# Patient Record
Sex: Female | Born: 1955
Health system: Southern US, Community
[De-identification: ages and names within clinical notes are randomized; demographics above are authoritative.]

## PROBLEM LIST (undated history)

## (undated) DIAGNOSIS — G35 Multiple sclerosis: Secondary | ICD-10-CM

## (undated) DIAGNOSIS — M797 Fibromyalgia: Secondary | ICD-10-CM

## (undated) DIAGNOSIS — E7800 Pure hypercholesterolemia, unspecified: Secondary | ICD-10-CM

## (undated) DIAGNOSIS — K5792 Diverticulitis of intestine, part unspecified, without perforation or abscess without bleeding: Secondary | ICD-10-CM

## (undated) DIAGNOSIS — E538 Deficiency of other specified B group vitamins: Secondary | ICD-10-CM

## (undated) DIAGNOSIS — K219 Gastro-esophageal reflux disease without esophagitis: Secondary | ICD-10-CM

## (undated) DIAGNOSIS — G43909 Migraine, unspecified, not intractable, without status migrainosus: Secondary | ICD-10-CM

## (undated) DIAGNOSIS — K279 Peptic ulcer, site unspecified, unspecified as acute or chronic, without hemorrhage or perforation: Secondary | ICD-10-CM

## (undated) HISTORY — DX: Fibromyalgia: M79.7

## (undated) HISTORY — DX: Pure hypercholesterolemia, unspecified: E78.00

## (undated) HISTORY — DX: Migraine, unspecified, not intractable, without status migrainosus: G43.909

## (undated) HISTORY — DX: Deficiency of other specified B group vitamins: E53.8

## (undated) HISTORY — DX: Peptic ulcer, site unspecified, unspecified as acute or chronic, without hemorrhage or perforation: K27.9

## (undated) HISTORY — DX: Gastro-esophageal reflux disease without esophagitis: K21.9

## (undated) HISTORY — DX: Diverticulitis of intestine, part unspecified, without perforation or abscess without bleeding: K57.92

## (undated) HISTORY — PX: BACK SURGERY: SHX140

## (undated) HISTORY — PX: OTHER SURGICAL HISTORY: SHX169

## (undated) HISTORY — PX: BREAST BIOPSY: SHX20

---

## 2004-01-21 ENCOUNTER — Other Ambulatory Visit: Payer: Self-pay

## 2005-05-02 ENCOUNTER — Ambulatory Visit: Payer: Self-pay | Admitting: Ophthalmology

## 2005-11-16 ENCOUNTER — Ambulatory Visit: Payer: Self-pay | Admitting: Neurology

## 2006-06-17 ENCOUNTER — Ambulatory Visit: Payer: Self-pay | Admitting: Pain Medicine

## 2006-07-08 ENCOUNTER — Ambulatory Visit: Payer: Self-pay | Admitting: Pain Medicine

## 2006-07-09 ENCOUNTER — Ambulatory Visit: Payer: Self-pay | Admitting: Pain Medicine

## 2006-09-01 ENCOUNTER — Emergency Department: Payer: Self-pay | Admitting: Emergency Medicine

## 2006-09-04 ENCOUNTER — Ambulatory Visit: Payer: Self-pay | Admitting: Pain Medicine

## 2006-10-08 ENCOUNTER — Ambulatory Visit: Payer: Self-pay | Admitting: Pain Medicine

## 2007-01-16 ENCOUNTER — Ambulatory Visit: Payer: Self-pay | Admitting: Pain Medicine

## 2007-02-02 ENCOUNTER — Emergency Department: Payer: Self-pay | Admitting: Emergency Medicine

## 2007-02-05 ENCOUNTER — Ambulatory Visit: Payer: Self-pay | Admitting: Pain Medicine

## 2007-03-20 ENCOUNTER — Ambulatory Visit: Payer: Self-pay | Admitting: Pain Medicine

## 2007-09-24 HISTORY — PX: COLONOSCOPY: SHX174

## 2007-10-08 ENCOUNTER — Ambulatory Visit: Payer: Self-pay | Admitting: Gastroenterology

## 2008-03-06 ENCOUNTER — Emergency Department: Payer: Self-pay | Admitting: Emergency Medicine

## 2008-03-09 ENCOUNTER — Ambulatory Visit: Payer: Self-pay | Admitting: Pain Medicine

## 2008-03-15 ENCOUNTER — Ambulatory Visit: Payer: Self-pay | Admitting: Pain Medicine

## 2008-04-07 ENCOUNTER — Ambulatory Visit: Payer: Self-pay | Admitting: Pain Medicine

## 2008-12-08 ENCOUNTER — Ambulatory Visit: Payer: Self-pay | Admitting: Internal Medicine

## 2008-12-29 ENCOUNTER — Ambulatory Visit: Payer: Self-pay | Admitting: Pain Medicine

## 2009-01-31 ENCOUNTER — Ambulatory Visit: Payer: Self-pay | Admitting: Pain Medicine

## 2009-03-03 ENCOUNTER — Ambulatory Visit: Payer: Self-pay | Admitting: Unknown Physician Specialty

## 2009-04-18 ENCOUNTER — Ambulatory Visit: Payer: Self-pay | Admitting: Pain Medicine

## 2009-05-02 ENCOUNTER — Ambulatory Visit: Payer: Self-pay | Admitting: Pain Medicine

## 2009-08-12 ENCOUNTER — Emergency Department: Payer: Self-pay | Admitting: Emergency Medicine

## 2009-12-28 ENCOUNTER — Ambulatory Visit: Payer: Self-pay | Admitting: Internal Medicine

## 2010-03-06 ENCOUNTER — Ambulatory Visit: Payer: Self-pay | Admitting: Pain Medicine

## 2010-03-28 ENCOUNTER — Ambulatory Visit: Payer: Self-pay | Admitting: Pain Medicine

## 2011-01-09 ENCOUNTER — Ambulatory Visit: Payer: Self-pay | Admitting: Internal Medicine

## 2011-07-25 ENCOUNTER — Ambulatory Visit: Payer: Self-pay | Admitting: Internal Medicine

## 2012-01-24 ENCOUNTER — Ambulatory Visit: Payer: Self-pay | Admitting: Internal Medicine

## 2012-10-22 ENCOUNTER — Ambulatory Visit: Payer: Self-pay | Admitting: Pain Medicine

## 2012-11-05 ENCOUNTER — Ambulatory Visit: Payer: Self-pay | Admitting: Pain Medicine

## 2012-12-11 ENCOUNTER — Ambulatory Visit: Payer: Self-pay | Admitting: Pain Medicine

## 2013-01-26 ENCOUNTER — Ambulatory Visit: Payer: Self-pay | Admitting: Internal Medicine

## 2014-01-27 ENCOUNTER — Ambulatory Visit: Payer: Self-pay | Admitting: Internal Medicine

## 2014-03-03 ENCOUNTER — Ambulatory Visit: Payer: Self-pay | Admitting: Pain Medicine

## 2014-03-16 ENCOUNTER — Ambulatory Visit: Payer: Self-pay | Admitting: Pain Medicine

## 2014-07-12 ENCOUNTER — Emergency Department: Payer: Self-pay | Admitting: Emergency Medicine

## 2014-07-12 LAB — CBC WITH DIFFERENTIAL/PLATELET
Basophil #: 0 10*3/uL (ref 0.0–0.1)
Basophil %: 0.4 %
Eosinophil #: 0 10*3/uL (ref 0.0–0.7)
Eosinophil %: 0.2 %
HCT: 38.7 % (ref 35.0–47.0)
HGB: 12.7 g/dL (ref 12.0–16.0)
Lymphocyte #: 0.6 10*3/uL — ABNORMAL LOW (ref 1.0–3.6)
Lymphocyte %: 7.7 %
MCH: 27.3 pg (ref 26.0–34.0)
MCHC: 32.9 g/dL (ref 32.0–36.0)
MCV: 83 fL (ref 80–100)
Monocyte #: 0.2 x10 3/mm (ref 0.2–0.9)
Monocyte %: 2.6 %
Neutrophil #: 6.5 10*3/uL (ref 1.4–6.5)
Neutrophil %: 89.1 %
Platelet: 182 10*3/uL (ref 150–440)
RBC: 4.65 10*6/uL (ref 3.80–5.20)
RDW: 13.2 % (ref 11.5–14.5)
WBC: 7.3 10*3/uL (ref 3.6–11.0)

## 2014-07-12 LAB — COMPREHENSIVE METABOLIC PANEL
Albumin: 4 g/dL (ref 3.4–5.0)
Alkaline Phosphatase: 67 U/L
Anion Gap: 10 (ref 7–16)
BUN: 9 mg/dL (ref 7–18)
Bilirubin,Total: 0.8 mg/dL (ref 0.2–1.0)
Calcium, Total: 9.1 mg/dL (ref 8.5–10.1)
Chloride: 103 mmol/L (ref 98–107)
Co2: 25 mmol/L (ref 21–32)
Creatinine: 0.94 mg/dL (ref 0.60–1.30)
EGFR (African American): >60
EGFR (Non-African Amer.): >60
Glucose: 120 mg/dL — ABNORMAL HIGH (ref 65–99)
Osmolality: 276 (ref 275–301)
Potassium: 4.1 mmol/L (ref 3.5–5.1)
SGOT(AST): 22 U/L (ref 15–37)
SGPT (ALT): 23 U/L (ref 12–78)
Sodium: 138 mmol/L (ref 136–145)
Total Protein: 7.8 g/dL (ref 6.4–8.2)

## 2014-07-12 LAB — URINALYSIS, COMPLETE
Bacteria: NONE SEEN
Bilirubin,UR: NEGATIVE
Glucose,UR: NEGATIVE mg/dL (ref 0–75)
Ketone: NEGATIVE
Leukocyte Esterase: NEGATIVE
Nitrite: NEGATIVE
Ph: 7 (ref 4.5–8.0)
Protein: NEGATIVE
RBC,UR: 3 /HPF (ref 0–5)
Specific Gravity: 1.011 (ref 1.003–1.030)
Squamous Epithelial: <1
WBC UR: 1 /HPF (ref 0–5)

## 2014-07-12 LAB — LIPASE, BLOOD: Lipase: 80 U/L (ref 73–393)

## 2015-02-15 ENCOUNTER — Ambulatory Visit: Payer: Self-pay | Admitting: Internal Medicine

## 2015-04-20 ENCOUNTER — Encounter: Payer: Self-pay | Admitting: Neurology

## 2015-04-20 ENCOUNTER — Ambulatory Visit (INDEPENDENT_AMBULATORY_CARE_PROVIDER_SITE_OTHER): Payer: Medicare Other | Admitting: Neurology

## 2015-04-20 VITALS — BP 124/73 | HR 68 | Temp 98.3°F | Ht 68.0 in | Wt 192.6 lb

## 2015-04-20 DIAGNOSIS — R29898 Other symptoms and signs involving the musculoskeletal system: Secondary | ICD-10-CM | POA: Diagnosis not present

## 2015-04-20 DIAGNOSIS — R202 Paresthesia of skin: Secondary | ICD-10-CM

## 2015-04-20 DIAGNOSIS — M542 Cervicalgia: Secondary | ICD-10-CM

## 2015-04-20 DIAGNOSIS — M5010 Cervical disc disorder with radiculopathy, unspecified cervical region: Secondary | ICD-10-CM | POA: Diagnosis not present

## 2015-04-20 DIAGNOSIS — R200 Anesthesia of skin: Secondary | ICD-10-CM

## 2015-04-20 NOTE — Progress Notes (Signed)
[facility]    Provider:  Dr Jaynee Eagles Referring Provider: Marden Noble, MD Primary Care Physician:  Marden Noble, MD  CC:  Neck pain, arm weakness, arm numbness  HPI:  Ashlee Richardson is a 59 y.o. female here as a referral from Dr. Brynda Greathouse for neck pain. PMHx chronic pain, migraine, fibromyalgia, chronic neck and low back pain. Acute onset neck and right arm pain with right arm numbness. She went to the dentist and had a tooth filled. She went April 16th. She had to numb it a few times, it hit a nerve. After that the whole right side of the neck and head hurts. Can't describe, just says "pain". Denies tingling, burning, shooting. Just painful.  She went to family doctor, prednisone and antibiotics didn't help. Can't raise her right arm. Whole arm gets numb on the right. Weakness and pain moving the right arm. It is all day long, painful continuously. Tremor in the head only started after this incident, she has a tremor of the head and can't control it. She had "spots on her brain" when she saw a neurologist in the past. Saw a neurologist for migraines in the past. Has a chronic history of neck pain but mri's of cervical spine are unremarkable. She has chronic pain in the neck and went to pain management. No bowel or bladder changes.  Reviewed notes, labs and imaging from outside physicians, which showed: She has undergone myoneural blocks, she has had TENS unit applied, she has been seen by Dr. Deanne Coffer the pain management clinic. She continues to be bothered by a lot of pain and sciatic nerve problems. She has chronic pain syndrome, chronic low back pain, status post laminectomy syndrome. Radiculopathy and chronic pain.MRi of the cervical spine 02/2014 shows 5 cervical spine 03/12/2014 showed a stable endplate degenerative changes at C4-C5 C5-C6 and C6-C7. Minimal enlargement of broad-based central disc protrusion at C3-C4 with no resulting cord deformity or foraminal compromise.  Spondylosis from C4-C5 through C6-C7, mildly progressive at C4-C5 and C5-C6. There is mild to moderate associated foraminal narrowing as detailed above worse on the left at C5-C6. No cord deformity. MRI of the lumbar spine in March 2015 showed overall stable mild bilateral lateral recess stenosis and mild bilateral foraminal stenosis at L2-L3,. CMP unremarkable last 06/2014.  Review of Systems: Patient complains of symptoms per HPI as well as the following symptoms: Snoring, memory loss, numbness, tremor, sleepiness, snoring, restless legs, constipation, aching muscles, not enough sleep. Pertinent negatives per HPI. All others negative.   History   Social History  . Marital Status: Single    Spouse Name: N/A  . Number of Children: 2  . Years of Education: College   Occupational History  . Not on file.   Social History Main Topics  . Smoking status: Never Smoker   . Smokeless tobacco: Not on file  . Alcohol Use: No  . Drug Use: No  . Sexual Activity: Not on file   Other Topics Concern  . Not on file   Social History Narrative   Lives at home by herself.   Caffeine use: 2 cups coffee per day    Family History  Problem Relation Age of Onset  . Heart disease    . Cancer      Breast  . Diabetes      Past Medical History  Diagnosis Date  . Migraine   . High cholesterol   . Fibromyalgia     Past Surgical  History  Procedure Laterality Date  . Back surgery  1991?    Pt usure of date  . Foot sugery Right     3rd toe from the right"    Current Outpatient Prescriptions  Medication Sig Dispense Refill  . CRESTOR 10 MG tablet Take 10 mg by mouth daily.  6  . HYDROcodone-acetaminophen (NORCO/VICODIN) 5-325 MG per tablet Take 1 tablet by mouth every 4 (four) hours as needed.    Marland Kitchen oxyCODONE-acetaminophen (PERCOCET) 7.5-325 MG per tablet Take 1 tablet by mouth as needed.     No current facility-administered medications for this visit.    Allergies as of 04/20/2015 - never  reviewed  Allergen Reaction Noted  . Fenofibric acid  04/20/2015  . Lipitor [atorvastatin]  04/20/2015  . Nsaids  04/20/2015    Vitals: BP 124/73 mmHg  Pulse 68  Temp(Src) 98.3 F (36.8 C)  Ht 5\' 8"  (1.727 m)  Wt 192 lb 9.6 oz (87.363 kg)  BMI 29.29 kg/m2 Last Weight:  Wt Readings from Last 1 Encounters:  04/20/15 192 lb 9.6 oz (87.363 kg)   Last Height:   Ht Readings from Last 1 Encounters:  04/20/15 5\' 8"  (1.610 m)    Physical exam: Exam: Gen: NAD, conversant, well nourised, overweight, well groomed                     CV: RRR, no MRG. No Carotid Bruits. No peripheral edema, warm, nontender Eyes: Conjunctivae clear without exudates or hemorrhage  Neuro: Detailed Neurologic Exam  Speech:    Speech is normal; fluent and spontaneous with normal comprehension.  Cognition:    The patient is oriented to person, place, and time;     recent and remote memory intact;     language fluent;     normal attention, concentration,     fund of knowledge Cranial Nerves:    The pupils are equal, round, and reactive to light. The fundi are normal and spontaneous venous pulsations are present. Visual fields are full to finger confrontation. Extraocular movements are intact. Trigeminal sensation is intact and the muscles of mastication are normal. The face is symmetric. The palate elevates in the midline. Hearing intact. Voice is normal. Shoulder shrug is normal. The tongue has normal motion without fasciculations.   Coordination:    Unable to perform due to pain  Gait:    No ataxia  Motor Observation and posture:    Patient has her head rotated ted in laterocollis with elevated right trapezius. She has a "no-no" tremor. However the tone her neck and shoulders does not appear increased. The tremor and laterocollis are distractable.  Tone:    Normal muscle tone.       Strength:    Right arm weakness secondary to pain, right leg prox weakness due to back pain     Sensation:  intact to LT     Reflex Exam:  DTR's:    Attempted right arm, deferred due to patient pain. Deep tendon reflexes in the upper and lower extremities are intact bilaterally.  Toes:    The toes are downgoing bilaterally.   Clonus:    Clonus is absent.      Assessment/Plan:   Ashlee Richardson is a 59 y.o. female here as a referral from Dr. Brynda Greathouse for neck pain. PMHx chronic pain, migraine, fibromyalgia, chronic neck and low back pain. Acute onset neck and right arm pain with right arm numbness. Patient has her head rotated  in laterocollis  with elevated right trapezius. She has a "no-no" tremor. However the tone her neck and shoulders does not appear increased. The tremor and laterocollis are distractable. Appears to be musculoskeletal in nature but given the acute onset with right-sided numbness and weakness and neck pain, should check an MRI of the neck again.   MRi of the neck Heat, massage, stretching Offered muscle relaxers but she said she has tried them all in the past.   Sarina Ill, MD  [facility] [address] [place], [place] [address]  Phone [phone] Fax [phone]

## 2015-04-20 NOTE — Patient Instructions (Signed)
Overall you are doing fairly well but I do want to suggest a few things today:   Remember to drink plenty of fluid, eat healthy meals and do not skip any meals. Try to eat protein with a every meal and eat a healthy snack such as fruit or nuts in between meals. Try to keep a regular sleep-wake schedule and try to exercise daily, particularly in the form of walking, 20-30 minutes a day, if you can.   As far as your medications are concerned, I would like to suggest  As far as diagnostic testing: MRI  cervical spine  I would like to see you back after workup, sooner if we need to. Please call us with any interim questions, concerns, problems, updates or refill requests.   Please also call us for any test results so we can go over those with you on the phone.  My clinical assistant and will answer any of your questions and relay your messages to me and also relay most of my messages to you.   Our phone number is [phone]. We also have an after hours call service for urgent matters and there is a physician on-call for urgent questions. For any emergencies you know to call 911 or go to the nearest emergency room

## 2015-05-08 ENCOUNTER — Ambulatory Visit
Admission: RE | Admit: 2015-05-08 | Discharge: 2015-05-08 | Disposition: A | Discharge status: Home / Self Care | Payer: Self-pay | Source: Ambulatory Visit | Attending: Neurology | Admitting: Neurology

## 2015-05-08 DIAGNOSIS — M5010 Cervical disc disorder with radiculopathy, unspecified cervical region: Secondary | ICD-10-CM

## 2015-05-08 DIAGNOSIS — R29898 Other symptoms and signs involving the musculoskeletal system: Secondary | ICD-10-CM

## 2015-05-08 DIAGNOSIS — M542 Cervicalgia: Secondary | ICD-10-CM | POA: Diagnosis not present

## 2015-05-08 DIAGNOSIS — R200 Anesthesia of skin: Secondary | ICD-10-CM

## 2015-05-10 ENCOUNTER — Ambulatory Visit: Payer: Self-pay | Admitting: Pain Medicine

## 2015-05-10 ENCOUNTER — Telehealth: Payer: Self-pay

## 2015-05-10 NOTE — Telephone Encounter (Signed)
Spoke to patient, recommended going back to primary care for her arm pain.

## 2015-05-10 NOTE — Telephone Encounter (Signed)
Patient was informed of MRI of cervical spine being stable and  unchanged since her last MRI of cervical spine, pt asked if she could still speak with Dr. Jaynee Eagles.  She wants to discuss what could be the problem since this scan was normal.  Thanks

## 2015-05-23 ENCOUNTER — Other Ambulatory Visit: Payer: Self-pay | Admitting: Pain Medicine

## 2015-05-23 DIAGNOSIS — G35 Multiple sclerosis: Secondary | ICD-10-CM | POA: Insufficient documentation

## 2015-05-23 DIAGNOSIS — G43109 Migraine with aura, not intractable, without status migrainosus: Secondary | ICD-10-CM

## 2015-05-23 DIAGNOSIS — M5481 Occipital neuralgia: Secondary | ICD-10-CM | POA: Insufficient documentation

## 2015-05-23 DIAGNOSIS — M5136 Other intervertebral disc degeneration, lumbar region: Secondary | ICD-10-CM

## 2015-05-23 DIAGNOSIS — M5030 Other cervical disc degeneration, unspecified cervical region: Secondary | ICD-10-CM

## 2015-05-24 ENCOUNTER — Ambulatory Visit: Payer: Self-pay | Admitting: Pain Medicine

## 2015-10-17 DIAGNOSIS — G43019 Migraine without aura, intractable, without status migrainosus: Secondary | ICD-10-CM | POA: Insufficient documentation

## 2016-05-02 ENCOUNTER — Encounter: Payer: Self-pay | Admitting: *Deleted

## 2016-05-07 ENCOUNTER — Ambulatory Visit (INDEPENDENT_AMBULATORY_CARE_PROVIDER_SITE_OTHER): Payer: Medicare Other | Admitting: General Surgery

## 2016-05-07 ENCOUNTER — Encounter: Payer: Self-pay | Admitting: General Surgery

## 2016-05-07 VITALS — BP 130/74 | HR 82 | Temp 97.1°F | Resp 12 | Ht 68.0 in | Wt 184.0 lb

## 2016-05-07 DIAGNOSIS — R109 Unspecified abdominal pain: Secondary | ICD-10-CM

## 2016-05-07 DIAGNOSIS — R1012 Left upper quadrant pain: Secondary | ICD-10-CM

## 2016-05-07 NOTE — Progress Notes (Signed)
Patient ID: Ashlee Richardson, female   DOB: [birth year], 59 y.o.   MRN: [id number]  Chief Complaint  Patient presents with  . Abdominal Pain    HPI Ashlee Richardson is a 60 y.o. female here today for a evaluation of abdominal pain. She says that the pain started last month and then went away. The pain returned and she has been on two rounds of antibiotics. While on the antibiotics the pain seems to go away and then returns soon afterwards. She says the pain is in the left lower flank and moves around to the front. She has a history of diverticulosis but no documented diverticulitis She has had constipation and then diarrhea. Urinating with out difficulty.  I have reviewed the history of present illness with the patient.  HPI  Past Medical History  Diagnosis Date  . Migraine   . High cholesterol   . Fibromyalgia   . GERD (gastroesophageal reflux disease)   . Diverticulitis     Past Surgical History  Procedure Laterality Date  . Back surgery  1991?    Pt usure of date  . Foot sugery Right     3rd toe from the right"    Family History  Problem Relation Age of Onset  . Heart disease    . Cancer      Breast  . Diabetes      Social History Social History  Substance Use Topics  . Smoking status: Never Smoker   . Smokeless tobacco: Never Used  . Alcohol Use: No    Allergies  Allergen Reactions  . Fenofibric Acid   . Lipitor [Atorvastatin]   . Nsaids     Current Outpatient Prescriptions  Medication Sig Dispense Refill  . CRESTOR 10 MG tablet Take 10 mg by mouth daily.  6  . esomeprazole (NEXIUM) 40 MG capsule TAKE ONE CAPSULE BY MOUTH ONCE A DAY FOR GERD  4  . gabapentin (NEURONTIN) 100 MG capsule Take 100 mg by mouth 3 (three) times daily.     No current facility-administered medications for this visit.    Review of Systems Review of Systems  Constitutional: Negative.   Respiratory: Negative.   Cardiovascular: Negative.   Gastrointestinal: Positive for  abdominal pain, diarrhea, constipation and abdominal distention. Negative for nausea, vomiting, blood in stool, anal bleeding and rectal pain.    Blood pressure 130/74, pulse 82, temperature 97.1 F (36.2 C), resp. rate 12, height 5' 8" (1.727 m), weight 184 lb (83.462 kg).  Physical Exam Physical Exam  Constitutional: She is oriented to person, place, and time. She appears well-developed and well-nourished.  Eyes: Conjunctivae are normal. No scleral icterus.  Neck: Neck supple.  Cardiovascular: Normal rate, regular rhythm and normal heart sounds.   Pulmonary/Chest: Effort normal and breath sounds normal.  Abdominal: Soft. Normal appearance. There is no hepatomegaly. There is tenderness. No hernia.    Lymphadenopathy:    She has no cervical adenopathy.  Neurological: She is alert and oriented to person, place, and time.  Skin: Skin is warm and dry.  Psychiatric: She has a normal mood and affect.    Data Reviewed   Assessment   Left flank pain Pt has not any imaging nor labs since the onset of her left flank pain. Location of pain does not suggest diverticulitis.  Discussed fully with pt. CBC,Met C, Lipase requested. Also CT abd/pelvis  Plan    Patient has been scheduled for a CT abdomen/pelvis with contrast at [facility] for  05-09-16 at 8:30 am (arrive 8:15 am). Prep: NPO after midnight and pick up prep kit. Patient verbalizes understanding.  Follow up appointment to be scheduled after CT scan completed.     PCP:  Nicky Pugh This has been scribed by Lesly Rubenstein LPN   [facility] 05/07/2016, 4:03 PM

## 2016-05-08 ENCOUNTER — Telehealth: Payer: Self-pay | Admitting: *Deleted

## 2016-05-08 LAB — MICROSCOPIC EXAMINATION
Bacteria, UA: NONE SEEN
Casts: NONE SEEN /lpf
RBC, UA: NONE SEEN /hpf (ref 0–?)

## 2016-05-08 LAB — URINALYSIS, ROUTINE W REFLEX MICROSCOPIC
Bilirubin, UA: NEGATIVE
Glucose, UA: NEGATIVE
Ketones, UA: NEGATIVE
Nitrite, UA: NEGATIVE
Protein, UA: NEGATIVE
RBC, UA: NEGATIVE
Specific Gravity, UA: 1.007 (ref 1.005–1.030)
Urobilinogen, Ur: 0.2 mg/dL (ref 0.2–1.0)
pH, UA: 7.5 (ref 5.0–7.5)

## 2016-05-08 LAB — COMPREHENSIVE METABOLIC PANEL
ALT: 17 IU/L (ref 0–32)
AST: 18 IU/L (ref 0–40)
Albumin/Globulin Ratio: 1.9 (ref 1.2–2.2)
Albumin: 4.7 g/dL (ref 3.5–5.5)
Alkaline Phosphatase: 66 IU/L (ref 39–117)
BUN/Creatinine Ratio: 9 (ref 9–23)
BUN: 6 mg/dL (ref 6–24)
Bilirubin Total: 0.5 mg/dL (ref 0.0–1.2)
CO2: 25 mmol/L (ref 18–29)
Calcium: 10 mg/dL (ref 8.7–10.2)
Chloride: 102 mmol/L (ref 96–106)
Creatinine, Ser: 0.7 mg/dL (ref 0.57–1.00)
GFR calc Af Amer: 110 mL/min/{1.73_m2} (ref >59)
GFR calc non Af Amer: 95 mL/min/{1.73_m2} (ref >59)
Globulin, Total: 2.5 g/dL (ref 1.5–4.5)
Glucose: 88 mg/dL (ref 65–99)
Potassium: 5.1 mmol/L (ref 3.5–5.2)
Sodium: 144 mmol/L (ref 134–144)
Total Protein: 7.2 g/dL (ref 6.0–8.5)

## 2016-05-08 LAB — CBC WITH DIFFERENTIAL/PLATELET
Basophils Absolute: 0 10*3/uL (ref 0.0–0.2)
Basos: 1 %
EOS (ABSOLUTE): 0.1 10*3/uL (ref 0.0–0.4)
Eos: 2 %
Hematocrit: 38.4 % (ref 34.0–46.6)
Hemoglobin: 12.5 g/dL (ref 11.1–15.9)
Immature Grans (Abs): 0 10*3/uL (ref 0.0–0.1)
Immature Granulocytes: 0 %
Lymphocytes Absolute: 2.5 10*3/uL (ref 0.7–3.1)
Lymphs: 56 %
MCH: 27.1 pg (ref 26.6–33.0)
MCHC: 32.6 g/dL (ref 31.5–35.7)
MCV: 83 fL (ref 79–97)
Monocytes Absolute: 0.3 10*3/uL (ref 0.1–0.9)
Monocytes: 7 %
Neutrophils Absolute: 1.5 10*3/uL (ref 1.4–7.0)
Neutrophils: 34 %
Platelets: 204 10*3/uL (ref 150–379)
RBC: 4.61 x10E6/uL (ref 3.77–5.28)
RDW: 13.7 % (ref 12.3–15.4)
WBC: 4.5 10*3/uL (ref 3.4–10.8)

## 2016-05-08 LAB — LIPASE: Lipase: 25 U/L (ref 0–59)

## 2016-05-08 NOTE — Telephone Encounter (Signed)
-----   Message from Christene Lye, MD sent at 05/08/2016  9:57 AM EDT ----- Inform pt labs are normal.

## 2016-05-08 NOTE — Telephone Encounter (Signed)
Notified patient as instructed, patient pleased. Discussed follow-up appointments, patient agrees  

## 2016-05-08 NOTE — Progress Notes (Signed)
Quick Note:  Inform pt labs are normal. ______ 

## 2016-05-09 ENCOUNTER — Ambulatory Visit
Admission: RE | Admit: 2016-05-09 | Discharge: 2016-05-09 | Disposition: A | Discharge status: Home / Self Care | Payer: Medicare Other | Source: Ambulatory Visit | Attending: General Surgery | Admitting: General Surgery

## 2016-05-09 DIAGNOSIS — I700 Atherosclerosis of aorta: Secondary | ICD-10-CM | POA: Insufficient documentation

## 2016-05-09 DIAGNOSIS — R109 Unspecified abdominal pain: Secondary | ICD-10-CM

## 2016-05-09 DIAGNOSIS — D1809 Hemangioma of other sites: Secondary | ICD-10-CM | POA: Diagnosis not present

## 2016-05-09 DIAGNOSIS — K5730 Diverticulosis of large intestine without perforation or abscess without bleeding: Secondary | ICD-10-CM | POA: Insufficient documentation

## 2016-05-09 DIAGNOSIS — R1012 Left upper quadrant pain: Secondary | ICD-10-CM | POA: Diagnosis present

## 2016-05-09 MED ORDER — IOPAMIDOL (ISOVUE-300) INJECTION 61%
100.0000 mL | Freq: Once | INTRAVENOUS | Status: AC | PRN
  Administered 2016-05-09: 100 mL via INTRAVENOUS

## 2016-05-15 ENCOUNTER — Encounter: Payer: Self-pay | Admitting: General Surgery

## 2016-05-15 ENCOUNTER — Ambulatory Visit (INDEPENDENT_AMBULATORY_CARE_PROVIDER_SITE_OTHER): Payer: Medicare Other | Admitting: General Surgery

## 2016-05-15 VITALS — BP 138/64 | HR 70 | Resp 12 | Ht 68.0 in | Wt 187.0 lb

## 2016-05-15 DIAGNOSIS — R1012 Left upper quadrant pain: Secondary | ICD-10-CM | POA: Diagnosis not present

## 2016-05-15 DIAGNOSIS — R109 Unspecified abdominal pain: Secondary | ICD-10-CM

## 2016-05-15 NOTE — Progress Notes (Addendum)
Follow up CT scan 05-09-16. She states she is still having some left flank pain but it is not as bad as it was.   CT reviewed and reveals no evidence of intraabdominal or retroperitoneal pathology to account for her left flank pain. There is diverticulosis but no evidence of diverticulitis. Her pain is likely for lumbosacral spine and as such repeat evaluation for this, discussed fully with patient.  Follow up with primary care, patient agrees. The patient is aware to call back for any questions or concerns.   PCP:  Brynda Greathouse This information has been scribed by Karie Fetch RN, BSN,BC.

## 2016-05-15 NOTE — Patient Instructions (Signed)
The patient is aware to call back for any questions or concerns.  

## 2016-05-16 ENCOUNTER — Encounter: Payer: Self-pay | Admitting: General Surgery

## 2017-01-13 ENCOUNTER — Emergency Department: Payer: Medicare Other

## 2017-01-13 ENCOUNTER — Encounter: Payer: Self-pay | Admitting: Emergency Medicine

## 2017-01-13 ENCOUNTER — Emergency Department
Admission: EM | Admit: 2017-01-13 | Discharge: 2017-01-13 | Disposition: A | Discharge status: Home / Self Care | Payer: Medicare Other | Attending: Emergency Medicine | Admitting: Emergency Medicine

## 2017-01-13 DIAGNOSIS — M5440 Lumbago with sciatica, unspecified side: Secondary | ICD-10-CM | POA: Diagnosis not present

## 2017-01-13 DIAGNOSIS — R0789 Other chest pain: Secondary | ICD-10-CM | POA: Diagnosis present

## 2017-01-13 DIAGNOSIS — R079 Chest pain, unspecified: Secondary | ICD-10-CM

## 2017-01-13 DIAGNOSIS — Z79899 Other long term (current) drug therapy: Secondary | ICD-10-CM | POA: Insufficient documentation

## 2017-01-13 DIAGNOSIS — M5430 Sciatica, unspecified side: Secondary | ICD-10-CM

## 2017-01-13 LAB — CBC
HCT: 37 % (ref 35.0–47.0)
Hemoglobin: 12.7 g/dL (ref 12.0–16.0)
MCH: 27.8 pg (ref 26.0–34.0)
MCHC: 34.2 g/dL (ref 32.0–36.0)
MCV: 81.3 fL (ref 80.0–100.0)
Platelets: 172 10*3/uL (ref 150–440)
RBC: 4.56 MIL/uL (ref 3.80–5.20)
RDW: 13.3 % (ref 11.5–14.5)
WBC: 4.2 10*3/uL (ref 3.6–11.0)

## 2017-01-13 LAB — BASIC METABOLIC PANEL
Anion gap: 8 (ref 5–15)
BUN: 11 mg/dL (ref 6–20)
CO2: 25 mmol/L (ref 22–32)
Calcium: 9.1 mg/dL (ref 8.9–10.3)
Chloride: 107 mmol/L (ref 101–111)
Creatinine, Ser: 0.57 mg/dL (ref 0.44–1.00)
GFR calc Af Amer: >60 mL/min (ref >60)
GFR calc non Af Amer: >60 mL/min (ref >60)
Glucose, Bld: 157 mg/dL — ABNORMAL HIGH (ref 65–99)
Potassium: 3.7 mmol/L (ref 3.5–5.1)
Sodium: 140 mmol/L (ref 135–145)

## 2017-01-13 LAB — TROPONIN I: Troponin I: <0.03 ng/mL (ref <0.03)

## 2017-01-13 MED ORDER — PREDNISONE 20 MG PO TABS: 40.0000 mg | ORAL_TABLET | Freq: Every day | ORAL | 0 refills | Status: DC

## 2017-01-13 MED ORDER — HYDROCODONE-ACETAMINOPHEN 5-325 MG PO TABS: 1.0000 | ORAL_TABLET | ORAL | 0 refills | Status: DC | PRN

## 2017-01-13 NOTE — ED Triage Notes (Signed)
States chest pain x 2 weeks and has not been taking her acid reflux medications. Unable to say why. States pain from leg arm down to left leg with a slight heaviness in her chest on the left side up near her left shoulder. States she does a lot of lifting and bending with her job

## 2017-01-13 NOTE — Discharge Instructions (Signed)
You have been seen in the emergency department today for chest pain and left leg pain. Your workup has shown normal results and is most consistent with sciatica of your left leg.  As we discussed please follow-up with your primary care physician in the next 1-2 days for recheck. Return to the emergency department for any further chest pain, trouble breathing, or any other symptom personally concerning to yourself. Please call the numbers provided for orthopedic and cardiology follow up on MONDAY to arrange an appointment.

## 2017-01-13 NOTE — ED Provider Notes (Signed)
[facility] Emergency Department Provider Note  Time seen: 2:44 PM  I have reviewed the triage vital signs and the nursing notes.   HISTORY  Chief Complaint Chest Pain    HPI Ashlee Richardson is a 61 y.o. female with a past medical history fibromyalgia, hyperlipidemia, presents the emergency department with intermittent chest pain, left arm pain and left leg pain. According to the patient for the past 2-3 weeks she's been having intermittent chest discomfort which she describes as reflux. She has not been taking her reflux medications. She also states is a separate issue she has been having pain in the left shoulder when she moves her left shoulder and pain in the left hip which shoots down her left leg. Patient states a history of back pain and sciatica in the past. Patient has been seen at the pain clinic but is no longer being seen. Patient states for the past several days she has been having spasms in the left back and has to lean to her right to relieve the discomfort. Any nausea, shortness of breath or diaphoresis. Denies any history of cardiac disease.  Past Medical History:  Diagnosis Date  . Diverticulitis   . Fibromyalgia   . GERD (gastroesophageal reflux disease)   . High cholesterol   . Migraine     Patient Active Problem List   Diagnosis Date Noted  . DDD (degenerative disc disease), cervical 05/23/2015  . Migraine 05/23/2015  . Bilateral occipital neuralgia 05/23/2015  . DDD (degenerative disc disease), lumbar 05/23/2015  . Multiple sclerosis ([place]) 05/23/2015  . Cervical disc disorder with radiculopathy of cervical region 04/20/2015  . Neck pain, acute 04/20/2015  . Right arm numbness 04/20/2015  . Right arm weakness 04/20/2015    Past Surgical History:  Procedure Laterality Date  . BACK SURGERY  1991?   Pt usure of date  . Foot sugery Right    3rd toe from the right"    Prior to Admission medications   Medication Sig Start Date End  Date Taking? Authorizing Provider  CRESTOR 10 MG tablet Take 10 mg by mouth daily. 03/21/15   Historical Provider, MD  esomeprazole (NEXIUM) 40 MG capsule TAKE ONE CAPSULE BY MOUTH ONCE A DAY FOR GERD 04/12/16   Historical Provider, MD  gabapentin (NEURONTIN) 100 MG capsule Take 100 mg by mouth 3 (three) times daily.    Historical Provider, MD    Allergies  Allergen Reactions  . Fenofibric Acid   . Lipitor [Atorvastatin]   . Nsaids     Family History  Problem Relation Age of Onset  . Heart disease    . Cancer      Breast  . Diabetes      Social History Social History  Substance Use Topics  . Smoking status: Never Smoker  . Smokeless tobacco: Never Used  . Alcohol use No    Review of Systems Constitutional: Negative for fever. Cardiovascular: Intermittent chest discomfort for the past 3 weeks. None currently. Respiratory: Negative for shortness of breath. Gastrointestinal: Negative for abdominal pain Musculoskeletal: Positive for lower back pain and pain shooting down the left leg. Occasional pain in the left shoulder. Skin: Negative for rash. Neurological: Negative for headaches, focal weakness or numbness. 10-point ROS otherwise negative.  ____________________________________________   PHYSICAL EXAM:  VITAL SIGNS: ED Triage Vitals  Enc Vitals Group     BP 01/13/17 1116 (!) 134/53     Pulse Rate 01/13/17 1116 66     Resp 01/13/17  1116 16     Temp 01/13/17 1116 98.4 F (36.9 C)     Temp Source 01/13/17 1116 Oral     SpO2 01/13/17 1116 99 %     Weight 01/13/17 1111 185 lb (83.9 kg)     Height 01/13/17 1111 5\' 8"  (1.727 m)     Head Circumference --      Peak Flow --      Pain Score 01/13/17 1112 10     Pain Loc --      Pain Edu? --      Excl. in [place]? --     Constitutional: Alert and oriented. Well appearing and in no distress. Eyes: Normal exam ENT   Head: Normocephalic and atraumatic.   Mouth/Throat: Mucous membranes are moist. Cardiovascular:  Normal rate, regular rhythm. No murmur Respiratory: Normal respiratory effort without tachypnea nor retractions. Breath sounds are clear Gastrointestinal: Soft and nontender. No distention.   Musculoskeletal: Moderate lumbar tenderness to palpation especially over the left SI joint. Good range of motion left shoulder although she states some discomfort with range of motion. Neurologic:  Normal speech and language. No gross focal neurologic deficits  Skin:  Skin is warm, dry and intact.  Psychiatric: Mood and affect are normal.  ____________________________________________    EKG  EKG reviewed and interpreted by myself shows normal sinus rhythm at 71 bpm. Narrow QRS, normal axis, normal intervals with no concerning ST changes. Reassuring EKG.  ____________________________________________    RADIOLOGY  Chest x-ray negative  ____________________________________________   INITIAL IMPRESSION / ASSESSMENT AND PLAN / ED COURSE  Pertinent labs & imaging results that were available during my care of the patient were reviewed by me and considered in my medical decision making (see chart for details).  Patient's labs are largely within normal limits including negative troponin. Chest x-ray negative. EKG is reassuring. Patient's symptoms are very reproducible on examination including left shoulder discomfort and lower back pain/left leg pain. Patient has pain appears to be most consistent with sciatica on the lower extremity and muscle I felt the pain in the left shoulder. Patient denies any chest pain currently although states it does feel a gastric reflux for which she is prescribed medications but has not been taking them. Overall the patient appears well with a normal examination. I discussed follow-up with orthopedics as well as cardiology. Patient agreeable to plan. We will cover with a short course of pain medication and a burst course of  prednisone.  ____________________________________________   FINAL CLINICAL IMPRESSION(S) / ED DIAGNOSES  Musculoskeletal pain. Sciatica. Chest pain.    Harvest Dark, MD 01/13/17 1447

## 2017-01-13 NOTE — ED Notes (Signed)
Had the medic draw all tubes in case dr added on blood work not listed in the protocols

## 2017-04-18 ENCOUNTER — Other Ambulatory Visit: Payer: Self-pay | Admitting: Neurology

## 2017-04-18 DIAGNOSIS — G252 Other specified forms of tremor: Secondary | ICD-10-CM

## 2017-05-01 ENCOUNTER — Ambulatory Visit
Admission: RE | Admit: 2017-05-01 | Discharge: 2017-05-01 | Disposition: A | Discharge status: Home / Self Care | Payer: Medicare Other | Source: Ambulatory Visit | Attending: Neurology | Admitting: Neurology

## 2017-05-01 DIAGNOSIS — R9082 White matter disease, unspecified: Secondary | ICD-10-CM | POA: Insufficient documentation

## 2017-05-01 DIAGNOSIS — R413 Other amnesia: Secondary | ICD-10-CM | POA: Insufficient documentation

## 2017-05-01 DIAGNOSIS — G252 Other specified forms of tremor: Secondary | ICD-10-CM

## 2017-05-01 LAB — POCT I-STAT CREATININE: Creatinine, Ser: 0.7 mg/dL (ref 0.44–1.00)

## 2017-05-01 MED ORDER — GADOBENATE DIMEGLUMINE 529 MG/ML IV SOLN
20.0000 mL | Freq: Once | INTRAVENOUS | Status: AC | PRN
  Administered 2017-05-01: 17 mL via INTRAVENOUS

## 2017-06-12 LAB — HM MAMMOGRAPHY: HM Mammogram: NORMAL (ref 0–4)

## 2017-07-18 ENCOUNTER — Other Ambulatory Visit: Payer: Self-pay | Admitting: Internal Medicine

## 2017-07-18 DIAGNOSIS — R140 Abdominal distension (gaseous): Secondary | ICD-10-CM

## 2017-07-22 ENCOUNTER — Other Ambulatory Visit (HOSPITAL_COMMUNITY): Payer: Self-pay | Admitting: Neurology

## 2017-07-22 DIAGNOSIS — R9389 Abnormal findings on diagnostic imaging of other specified body structures: Secondary | ICD-10-CM

## 2017-07-24 ENCOUNTER — Ambulatory Visit
Admission: RE | Admit: 2017-07-24 | Discharge: 2017-07-24 | Disposition: A | Discharge status: Home / Self Care | Payer: Medicare Other | Source: Ambulatory Visit | Attending: Internal Medicine | Admitting: Internal Medicine

## 2017-07-24 DIAGNOSIS — R109 Unspecified abdominal pain: Secondary | ICD-10-CM | POA: Insufficient documentation

## 2017-07-24 DIAGNOSIS — I700 Atherosclerosis of aorta: Secondary | ICD-10-CM | POA: Diagnosis not present

## 2017-07-24 DIAGNOSIS — Z981 Arthrodesis status: Secondary | ICD-10-CM | POA: Diagnosis not present

## 2017-07-24 DIAGNOSIS — K5730 Diverticulosis of large intestine without perforation or abscess without bleeding: Secondary | ICD-10-CM | POA: Diagnosis not present

## 2017-07-24 DIAGNOSIS — N329 Bladder disorder, unspecified: Secondary | ICD-10-CM | POA: Diagnosis not present

## 2017-07-24 DIAGNOSIS — D1803 Hemangioma of intra-abdominal structures: Secondary | ICD-10-CM | POA: Insufficient documentation

## 2017-07-24 DIAGNOSIS — R140 Abdominal distension (gaseous): Secondary | ICD-10-CM | POA: Diagnosis not present

## 2017-07-24 LAB — POCT I-STAT CREATININE: Creatinine, Ser: 0.7 mg/dL (ref 0.44–1.00)

## 2017-07-24 MED ORDER — IOPAMIDOL (ISOVUE-300) INJECTION 61%
100.0000 mL | Freq: Once | INTRAVENOUS | Status: AC | PRN
  Administered 2017-07-24: 100 mL via INTRAVENOUS

## 2017-07-25 ENCOUNTER — Other Ambulatory Visit: Payer: Self-pay | Admitting: Student

## 2017-07-30 ENCOUNTER — Ambulatory Visit (HOSPITAL_COMMUNITY)
Admission: RE | Admit: 2017-07-30 | Discharge: 2017-07-30 | Disposition: A | Discharge status: Home / Self Care | Payer: Medicare Other | Source: Ambulatory Visit | Attending: Neurology | Admitting: Neurology

## 2017-07-30 DIAGNOSIS — R9389 Abnormal findings on diagnostic imaging of other specified body structures: Secondary | ICD-10-CM

## 2017-07-30 DIAGNOSIS — R938 Abnormal findings on diagnostic imaging of other specified body structures: Secondary | ICD-10-CM | POA: Diagnosis present

## 2017-07-30 LAB — GLUCOSE, CSF: Glucose, CSF: 55 mg/dL (ref 40–70)

## 2017-07-30 LAB — CSF CELL COUNT WITH DIFFERENTIAL
RBC Count, CSF: 6 /mm3 — ABNORMAL HIGH
Tube #: 1
WBC, CSF: 1 /mm3 (ref 0–5)

## 2017-07-30 LAB — GLUCOSE, RANDOM: Glucose, Bld: 125 mg/dL — ABNORMAL HIGH (ref 65–99)

## 2017-07-30 LAB — PROTEIN, CSF: Total  Protein, CSF: 50 mg/dL — ABNORMAL HIGH (ref 15–45)

## 2017-07-30 MED ORDER — OXYCODONE-ACETAMINOPHEN 5-325 MG PO TABS
1.0000 | ORAL_TABLET | Freq: Once | ORAL | Status: AC
  Administered 2017-07-30: 1 via ORAL

## 2017-07-30 MED ORDER — OXYCODONE-ACETAMINOPHEN 5-325 MG PO TABS
ORAL_TABLET | ORAL | Status: AC
  Administered 2017-07-30: 1 via ORAL
  Filled 2017-07-30: qty 1

## 2017-07-30 MED ORDER — ACETAMINOPHEN 325 MG PO TABS
650.0000 mg | ORAL_TABLET | ORAL | Status: DC | PRN
  Filled 2017-07-30: qty 2

## 2017-07-30 MED ORDER — LIDOCAINE HCL (PF) 1 % IJ SOLN
INTRAMUSCULAR | Status: AC
  Filled 2017-07-30: qty 5

## 2017-07-30 MED ORDER — LIDOCAINE HCL (PF) 1 % IJ SOLN
5.0000 mL | Freq: Once | INTRAMUSCULAR | Status: AC
  Administered 2017-07-30: 5 mL via INTRADERMAL

## 2017-07-30 NOTE — Discharge Instructions (Signed)
Lumbar Puncture, Care After °Refer to this sheet in the next few weeks. These instructions provide you with information on caring for yourself after your procedure. Your health care provider may also give you more specific instructions. Your treatment has been planned according to current medical practices, but problems sometimes occur. Call your health care provider if you have any problems or questions after your procedure. °What can I expect after the procedure? °After your procedure, it is typical to have the following sensations: °· Mild discomfort or pain at the insertion site. °· Mild headache that is relieved with pain medicines. ° °Follow these instructions at home: ° °· Avoid lifting anything heavier than 10 lb (4.5 kg) for at least 12 hours after the procedure. °· Drink enough fluids to keep your urine clear or pale yellow. °Contact a health care provider if: °· You have fever or chills. °· You have nausea or vomiting. °· You have a headache that lasts for more than 2 days. °Get help right away if: °· You have any numbness or tingling in your legs. °· You are unable to control your bowel or bladder. °· You have bleeding or swelling in your back at the insertion site. °· You are dizzy or faint. °This information is not intended to replace advice given to you by your health care provider. Make sure you discuss any questions you have with your health care provider. °Document Released: 12/15/2013 Document Revised: 05/17/2016 Document Reviewed: 08/18/2013 °Elsevier Interactive Patient Education © 2017 [employer] ° °

## 2017-08-01 LAB — IGG CSF INDEX
Albumin CSF-mCnc: 39 mg/dL (ref 11–48)
Albumin: 4.5 g/dL (ref 3.6–4.8)
CSF IgG Index: 0.5 (ref 0.0–0.7)
IgG (Immunoglobin G), Serum: 663 mg/dL — ABNORMAL LOW (ref 700–1600)
IgG, CSF: 2.6 mg/dL (ref 0.0–8.6)
IgG/Alb Ratio, CSF: 0.07 (ref 0.00–0.25)

## 2017-08-01 LAB — OLIGOCLONAL BANDS, CSF + SERM

## 2017-08-01 LAB — ANGIOTENSIN CONVERTING ENZYME, CSF: Angio Convert Enzyme: 0.9 U/L (ref 0.0–2.8)

## 2017-08-02 LAB — HSV(HERPES SMPLX VRS)ABS-I+II(IGG)-CSF: HSV Type I/II Ab, IgG CSF: 1.89 IV — ABNORMAL HIGH (ref <=0.89)

## 2017-08-14 ENCOUNTER — Ambulatory Visit (INDEPENDENT_AMBULATORY_CARE_PROVIDER_SITE_OTHER): Payer: Medicare Other | Admitting: General Surgery

## 2017-08-14 ENCOUNTER — Encounter: Payer: Self-pay | Admitting: General Surgery

## 2017-08-14 VITALS — BP 130/62 | HR 70 | Resp 12 | Ht 68.0 in | Wt 186.0 lb

## 2017-08-14 DIAGNOSIS — Z1211 Encounter for screening for malignant neoplasm of colon: Secondary | ICD-10-CM | POA: Diagnosis not present

## 2017-08-14 MED ORDER — PEG 3350-KCL-NABCB-NACL-NASULF 236 G PO SOLR: ORAL | 0 refills | Status: DC

## 2017-08-14 NOTE — Patient Instructions (Signed)
Colonoscopy, Adult A colonoscopy is an exam to look at the entire large intestine. During the exam, a lubricated, bendable tube is inserted into the anus and then passed into the rectum, colon, and other parts of the large intestine. A colonoscopy is often done as a part of normal colorectal screening or in response to certain symptoms, such as anemia, persistent diarrhea, abdominal pain, and blood in the stool. The exam can help screen for and diagnose medical problems, including:  Tumors.  Polyps.  Inflammation.  Areas of bleeding.  Tell a health care provider about:  Any allergies you have.  All medicines you are taking, including vitamins, herbs, eye drops, creams, and over-the-counter medicines.  Any problems you or family members have had with anesthetic medicines.  Any blood disorders you have.  Any surgeries you have had.  Any medical conditions you have.  Any problems you have had passing stool. What are the risks? Generally, this is a safe procedure. However, problems may occur, including:  Bleeding.  A tear in the intestine.  A reaction to medicines given during the exam.  Infection (rare).  What happens before the procedure? Eating and drinking restrictions Follow instructions from your health care provider about eating and drinking, which may include:  A few days before the procedure - follow a low-fiber diet. Avoid nuts, seeds, dried fruit, raw fruits, and vegetables.  1-3 days before the procedure - follow a clear liquid diet. Drink only clear liquids, such as clear broth or bouillon, black coffee or tea, clear juice, clear soft drinks or sports drinks, gelatin dessert, and popsicles. Avoid any liquids that contain red or purple dye.  On the day of the procedure - do not eat or drink anything during the 2 hours before the procedure, or within the time period that your health care provider recommends.  Bowel prep If you were prescribed an oral bowel prep  to clean out your colon:  Take it as told by your health care provider. Starting the day before your procedure, you will need to drink a large amount of medicated liquid. The liquid will cause you to have multiple loose stools until your stool is almost clear or light green.  If your skin or anus gets irritated from diarrhea, you may use these to relieve the irritation: ? Medicated wipes, such as adult wet wipes with aloe and vitamin E. ? A skin soothing-product like petroleum jelly.  If you vomit while drinking the bowel prep, take a break for up to 60 minutes and then begin the bowel prep again. If vomiting continues and you cannot take the bowel prep without vomiting, call your health care provider.  General instructions  Ask your health care provider about changing or stopping your regular medicines. This is especially important if you are taking diabetes medicines or blood thinners.  Plan to have someone take you home from the hospital or clinic. What happens during the procedure?  An IV tube may be inserted into one of your veins.  You will be given medicine to help you relax (sedative).  To reduce your risk of infection: ? Your health care team will wash or sanitize their hands. ? Your anal area will be washed with soap.  You will be asked to lie on your side with your knees bent.  Your health care provider will lubricate a long, thin, flexible tube. The tube will have a camera and a light on the end.  The tube will be inserted into your   anus.  The tube will be gently eased through your rectum and colon.  Air will be delivered into your colon to keep it open. You may feel some pressure or cramping.  The camera will be used to take images during the procedure.  A small tissue sample may be removed from your body to be examined under a microscope (biopsy). If any potential problems are found, the tissue will be sent to a lab for testing.  If small polyps are found, your  health care provider may remove them and have them checked for cancer cells.  The tube that was inserted into your anus will be slowly removed. The procedure may vary among health care providers and hospitals. What happens after the procedure?  Your blood pressure, heart rate, breathing rate, and blood oxygen level will be monitored until the medicines you were given have worn off.  Do not drive for 24 hours after the exam.  You may have a small amount of blood in your stool.  You may pass gas and have mild abdominal cramping or bloating due to the air that was used to inflate your colon during the exam.  It is up to you to get the results of your procedure. Ask your health care provider, or the department performing the procedure, when your results will be ready. This information is not intended to replace advice given to you by your health care provider. Make sure you discuss any questions you have with your health care provider. Document Released: 12/07/2000 Document Revised: 10/10/2016 Document Reviewed: 02/21/2016 Elsevier Interactive Patient Education  2018 Elsevier Inc.  

## 2017-08-14 NOTE — Progress Notes (Signed)
Patient ID: Ashlee Richardson, female   DOB: 09-03-1956, 61 y.o.   MRN: [id number]  Chief Complaint  Patient presents with  . Colonoscopy    HPI Ashlee Richardson is a 61 y.o. female here today for a evalaution of colonoscopy. Last colonoscopy 10/08/2007. Patient states she always had constipation problems. Moves bowels once a week.  HPI  Past Medical History:  Diagnosis Date  . Diverticulitis   . Fibromyalgia   . GERD (gastroesophageal reflux disease)   . High cholesterol   . Migraine     Past Surgical History:  Procedure Laterality Date  . BACK SURGERY  1991?   Pt usure of date  . COLONOSCOPY  09/2007  . Foot sugery Right    3rd toe from the right"    Family History  Problem Relation Age of Onset  . Heart disease Unknown   . Cancer Unknown        Breast  . Diabetes Unknown     Social History Social History  Substance Use Topics  . Smoking status: Never Smoker  . Smokeless tobacco: Never Used  . Alcohol use No    Allergies  Allergen Reactions  . Fenofibric Acid   . Lipitor [Atorvastatin]   . Nsaids     Current Outpatient Prescriptions  Medication Sig Dispense Refill  . CRESTOR 10 MG tablet Take 10 mg by mouth daily.  6  . DULoxetine (CYMBALTA) 20 MG capsule Take 20 mg by mouth daily.    Marland Kitchen esomeprazole (NEXIUM) 40 MG capsule TAKE ONE CAPSULE BY MOUTH ONCE A DAY FOR GERD  4  . gabapentin (NEURONTIN) 100 MG capsule Take 100 mg by mouth 3 (three) times daily.    Marland Kitchen HYDROcodone-acetaminophen (NORCO/VICODIN) 5-325 MG tablet Take 1 tablet by mouth every 4 (four) hours as needed. 15 tablet 0  . predniSONE (DELTASONE) 20 MG tablet Take 2 tablets (40 mg total) by mouth daily. 10 tablet 0  . pregabalin (LYRICA) 75 MG capsule Take 75 mg by mouth 2 (two) times daily.    . polyethylene glycol (GOLYTELY/NULYTELY) 236 g solution One bottle for colonoscopy prep. 4000 mL 0   No current facility-administered medications for this visit.     Review of Systems Review of  Systems  Constitutional: Negative.   Respiratory: Negative.   Cardiovascular: Negative.   Gastrointestinal: Positive for constipation. Negative for abdominal pain, blood in stool and rectal pain.    Blood pressure 130/62, pulse 70, resp. rate 12, height 5\' 8"  (1.727 m), weight 186 lb (84.4 kg).  Physical Exam Physical Exam  Constitutional: She is oriented to person, place, and time. She appears well-developed and well-nourished.  Eyes: Conjunctivae are normal. No scleral icterus.  Neck: Neck supple.  Cardiovascular: Normal rate, regular rhythm and normal heart sounds.   Pulmonary/Chest: Effort normal and breath sounds normal.  Abdominal: Soft. Bowel sounds are normal. There is no tenderness.  Lymphadenopathy:    She has no cervical adenopathy.  Neurological: She is alert and oriented to person, place, and time.  Skin: Skin is warm and dry.    Data Reviewed Last colonoscopy reviewed   Assessment      Stable exam. Due for 10 year  screening colonoscopy   Plan   Discussed colonoscopy with patient and she is agreeable     Colonoscopy with possible biopsy/polypectomy prn: Information regarding the procedure, including its potential risks and complications (including but not limited to perforation of the bowel, which may require emergency surgery to repair, and  bleeding) was verbally given to the patient. Educational information regarding lower intestinal endoscopy was given to the patient. Written instructions for how to complete the bowel prep using Miralax were provided. The importance of drinking ample fluids to avoid dehydration as a result of the prep emphasized.  HPI, Physical Exam, Assessment and Plan have been scribed under the direction and in the presence of Mckinley Jewel, MD  Ashlee Richardson, CMA  I have completed the exam and reviewed the above documentation for accuracy and completeness.  I agree with the above.  Haematologist has been used and any errors in  dictation or transcription are unintentional.  Seeplaputhur G. Jamal Collin, M.D., F.A.C.S.     Junie Panning Darnell Level 08/14/2017, 2:25 PM  Patient has been scheduled for a colonoscopy on 08-28-17 at [facility]. GoLytely prescription has been sent in to the patient's pharmacy today. Patient will make use of 2 bisacodyl tablets on Monday, 08-26-17 (one tablet in the morning and one tablet in the afternoon). Regular GoLytely/bisacodyl prep will be followed on Tuesday, 08-27-17. Colonoscopy instructions have been reviewed with the patient. This patient is aware to call the office if they have further questions.   Dominga Ferry, CMA

## 2017-08-22 ENCOUNTER — Other Ambulatory Visit: Payer: Self-pay | Admitting: General Surgery

## 2017-08-22 DIAGNOSIS — Z1211 Encounter for screening for malignant neoplasm of colon: Secondary | ICD-10-CM

## 2017-08-27 ENCOUNTER — Encounter: Payer: Self-pay | Admitting: *Deleted

## 2017-08-28 ENCOUNTER — Encounter
Admission: RE | Disposition: A | Discharge status: Home / Self Care | Payer: Self-pay | Source: Ambulatory Visit | Attending: General Surgery

## 2017-08-28 ENCOUNTER — Ambulatory Visit: Payer: Medicare Other | Admitting: Anesthesiology

## 2017-08-28 ENCOUNTER — Encounter: Payer: Self-pay | Admitting: *Deleted

## 2017-08-28 ENCOUNTER — Ambulatory Visit
Admission: RE | Admit: 2017-08-28 | Discharge: 2017-08-28 | Disposition: A | Discharge status: Home / Self Care | Payer: Medicare Other | Source: Ambulatory Visit | Attending: General Surgery | Admitting: General Surgery

## 2017-08-28 DIAGNOSIS — K5730 Diverticulosis of large intestine without perforation or abscess without bleeding: Secondary | ICD-10-CM | POA: Insufficient documentation

## 2017-08-28 DIAGNOSIS — E7800 Pure hypercholesterolemia, unspecified: Secondary | ICD-10-CM | POA: Insufficient documentation

## 2017-08-28 DIAGNOSIS — M1990 Unspecified osteoarthritis, unspecified site: Secondary | ICD-10-CM | POA: Insufficient documentation

## 2017-08-28 DIAGNOSIS — Z8249 Family history of ischemic heart disease and other diseases of the circulatory system: Secondary | ICD-10-CM | POA: Diagnosis not present

## 2017-08-28 DIAGNOSIS — K219 Gastro-esophageal reflux disease without esophagitis: Secondary | ICD-10-CM | POA: Insufficient documentation

## 2017-08-28 DIAGNOSIS — M797 Fibromyalgia: Secondary | ICD-10-CM | POA: Diagnosis not present

## 2017-08-28 DIAGNOSIS — Z803 Family history of malignant neoplasm of breast: Secondary | ICD-10-CM | POA: Diagnosis not present

## 2017-08-28 DIAGNOSIS — Z888 Allergy status to other drugs, medicaments and biological substances status: Secondary | ICD-10-CM | POA: Diagnosis not present

## 2017-08-28 DIAGNOSIS — Z833 Family history of diabetes mellitus: Secondary | ICD-10-CM | POA: Insufficient documentation

## 2017-08-28 DIAGNOSIS — G43909 Migraine, unspecified, not intractable, without status migrainosus: Secondary | ICD-10-CM | POA: Diagnosis not present

## 2017-08-28 DIAGNOSIS — Z79899 Other long term (current) drug therapy: Secondary | ICD-10-CM | POA: Insufficient documentation

## 2017-08-28 DIAGNOSIS — K5900 Constipation, unspecified: Secondary | ICD-10-CM | POA: Insufficient documentation

## 2017-08-28 DIAGNOSIS — Z1211 Encounter for screening for malignant neoplasm of colon: Secondary | ICD-10-CM | POA: Insufficient documentation

## 2017-08-28 DIAGNOSIS — K648 Other hemorrhoids: Secondary | ICD-10-CM | POA: Insufficient documentation

## 2017-08-28 HISTORY — PX: COLONOSCOPY WITH PROPOFOL: SHX5780

## 2017-08-28 SURGERY — COLONOSCOPY WITH PROPOFOL
Anesthesia: General

## 2017-08-28 MED ORDER — MIDAZOLAM HCL 2 MG/2ML IJ SOLN
INTRAMUSCULAR | Status: AC
  Filled 2017-08-28: qty 2

## 2017-08-28 MED ORDER — GLYCOPYRROLATE 0.2 MG/ML IJ SOLN
INTRAMUSCULAR | Status: DC | PRN
  Administered 2017-08-28: 0.2 mg via INTRAVENOUS

## 2017-08-28 MED ORDER — PROPOFOL 500 MG/50ML IV EMUL
INTRAVENOUS | Status: DC | PRN
  Administered 2017-08-28: 120 ug/kg/min via INTRAVENOUS

## 2017-08-28 MED ORDER — PROPOFOL 10 MG/ML IV BOLUS
INTRAVENOUS | Status: AC
  Filled 2017-08-28: qty 20

## 2017-08-28 MED ORDER — LIDOCAINE HCL (PF) 2 % IJ SOLN
INTRAMUSCULAR | Status: AC
  Filled 2017-08-28: qty 2

## 2017-08-28 MED ORDER — PROPOFOL 10 MG/ML IV BOLUS
INTRAVENOUS | Status: DC | PRN
  Administered 2017-08-28: 90 mg via INTRAVENOUS

## 2017-08-28 MED ORDER — SODIUM CHLORIDE 0.9 % IV SOLN
INTRAVENOUS | Status: DC
  Administered 2017-08-28: 10:00:00 via INTRAVENOUS

## 2017-08-28 NOTE — Transfer of Care (Signed)
Immediate Anesthesia Transfer of Care Note  Patient: Ashlee Richardson  Procedure(s) Performed: Procedure(s): COLONOSCOPY WITH PROPOFOL (N/A)  Patient Location: PACU and Endoscopy Unit  Anesthesia Type:General  Level of Consciousness: awake, alert  and oriented  Airway & Oxygen Therapy: Patient Spontanous Breathing and Patient connected to nasal cannula oxygen  Post-op Assessment: Report given to RN and Post -op Vital signs reviewed and stable  Post vital signs: Reviewed and stable  Last Vitals:  Vitals:   08/28/17 0913 08/28/17 1131  BP: 137/74 115/66  Pulse: 70 78  Resp: 18 16  Temp: 37 C 36.8 C  SpO2: 100% 100%    Last Pain:  Vitals:   08/28/17 1131  TempSrc: Tympanic  PainSc:       Patients Stated Pain Goal: 10 (43/32/95 1884)  Complications: No apparent anesthesia complications

## 2017-08-28 NOTE — Op Note (Signed)
[facility] Patient Name: Ashlee Richardson Procedure Date: 08/28/2017 10:46 AM MRN: [id number] Account #: [id number] Date of Birth: 23-Dec-1956 Admit Type: Outpatient Age: 61 Room: [facility] ENDO ROOM 1 Gender: Female Note Status: Finalized Procedure:            Colonoscopy Indications:          Screening for colorectal malignant neoplasm Providers:            Amai Cappiello G. Jamal Collin, MD Referring MD:         Mikeal Hawthorne. Brynda Greathouse MD, MD (Referring MD) Medicines:            Total IV Anesthesia (TIVA) Complications:        No immediate complications. Procedure:            Pre-Anesthesia Assessment:                       - Using IV propofol under the supervision of an                        anesthesiologist was determined to be medically                        necessary for this procedure based on review of the                        patient's medical history, medications, and prior                        anesthesia history.                       After obtaining informed consent, the colonoscope was                        passed under direct vision. Throughout the procedure,                        the patient's blood pressure, pulse, and oxygen                        saturations were monitored continuously. The                        Colonoscope was introduced through the anus and                        advanced to the the cecum, identified by the ileocecal                        valve. The colonoscopy was performed without                        difficulty. The patient tolerated the procedure well.                        The quality of the bowel preparation was good. Findings:      The perianal and digital rectal examinations were normal.      A few small and large-mouthed diverticula were found in the sigmoid       colon and descending colon.  Internal hemorrhoids were found during retroflexion. The hemorrhoids       were mild.      The exam was  otherwise without abnormality. Impression:           - Diverticulosis in the sigmoid colon and in the                        descending colon.                       - Internal hemorrhoids.                       - The examination was otherwise normal.                       - No specimens collected. Recommendation:       - Discharge patient to home.                       - Resume previous diet.                       - Continue present medications.                       - Repeat colonoscopy in 10 years for screening purposes. Procedure Code(s):    --- Professional ---                       [phone], Colonoscopy, flexible; diagnostic, including                        collection of specimen(s) by brushing or washing, when                        performed (separate procedure) Diagnosis Code(s):    --- Professional ---                       Z12.11, Encounter for screening for malignant neoplasm                        of colon                       K64.8, Other hemorrhoids                       K57.30, Diverticulosis of large intestine without                        perforation or abscess without bleeding CPT copyright 2016 American Medical Association. All rights reserved. The codes documented in this report are preliminary and upon coder review may  be revised to meet current compliance requirements. Christene Lye, MD 08/28/2017 11:30:32 AM This report has been signed electronically. Number of Addenda: 0 Note Initiated On: 08/28/2017 10:46 AM Scope Withdrawal Time: 0 hours 8 minutes 29 seconds  Total Procedure Duration: 0 hours 26 minutes 44 seconds       [facility]

## 2017-08-28 NOTE — H&P (View-Only) (Signed)
Patient ID: DANILYNN JEMISON, female   DOB: Jan 12, 1956, 61 y.o.   MRN: [id number]  Chief Complaint  Patient presents with  . Colonoscopy    HPI DEYCI GESELL is a 61 y.o. female here today for a evalaution of colonoscopy. Last colonoscopy 10/08/2007. Patient states she always had constipation problems. Moves bowels once a week.  HPI  Past Medical History:  Diagnosis Date  . Diverticulitis   . Fibromyalgia   . GERD (gastroesophageal reflux disease)   . High cholesterol   . Migraine     Past Surgical History:  Procedure Laterality Date  . BACK SURGERY  1991?   Pt usure of date  . COLONOSCOPY  09/2007  . Foot sugery Right    3rd toe from the right"    Family History  Problem Relation Age of Onset  . Heart disease Unknown   . Cancer Unknown        Breast  . Diabetes Unknown     Social History Social History  Substance Use Topics  . Smoking status: Never Smoker  . Smokeless tobacco: Never Used  . Alcohol use No    Allergies  Allergen Reactions  . Fenofibric Acid   . Lipitor [Atorvastatin]   . Nsaids     Current Outpatient Prescriptions  Medication Sig Dispense Refill  . CRESTOR 10 MG tablet Take 10 mg by mouth daily.  6  . DULoxetine (CYMBALTA) 20 MG capsule Take 20 mg by mouth daily.    Marland Kitchen esomeprazole (NEXIUM) 40 MG capsule TAKE ONE CAPSULE BY MOUTH ONCE A DAY FOR GERD  4  . gabapentin (NEURONTIN) 100 MG capsule Take 100 mg by mouth 3 (three) times daily.    Marland Kitchen HYDROcodone-acetaminophen (NORCO/VICODIN) 5-325 MG tablet Take 1 tablet by mouth every 4 (four) hours as needed. 15 tablet 0  . predniSONE (DELTASONE) 20 MG tablet Take 2 tablets (40 mg total) by mouth daily. 10 tablet 0  . pregabalin (LYRICA) 75 MG capsule Take 75 mg by mouth 2 (two) times daily.    . polyethylene glycol (GOLYTELY/NULYTELY) 236 g solution One bottle for colonoscopy prep. 4000 mL 0   No current facility-administered medications for this visit.     Review of Systems Review of  Systems  Constitutional: Negative.   Respiratory: Negative.   Cardiovascular: Negative.   Gastrointestinal: Positive for constipation. Negative for abdominal pain, blood in stool and rectal pain.    Blood pressure 130/62, pulse 70, resp. rate 12, height 5\' 8"  (1.727 m), weight 186 lb (84.4 kg).  Physical Exam Physical Exam  Constitutional: She is oriented to person, place, and time. She appears well-developed and well-nourished.  Eyes: Conjunctivae are normal. No scleral icterus.  Neck: Neck supple.  Cardiovascular: Normal rate, regular rhythm and normal heart sounds.   Pulmonary/Chest: Effort normal and breath sounds normal.  Abdominal: Soft. Bowel sounds are normal. There is no tenderness.  Lymphadenopathy:    She has no cervical adenopathy.  Neurological: She is alert and oriented to person, place, and time.  Skin: Skin is warm and dry.    Data Reviewed Last colonoscopy reviewed   Assessment      Stable exam. Due for 10 year  screening colonoscopy   Plan   Discussed colonoscopy with patient and she is agreeable     Colonoscopy with possible biopsy/polypectomy prn: Information regarding the procedure, including its potential risks and complications (including but not limited to perforation of the bowel, which may require emergency surgery to repair, and  bleeding) was verbally given to the patient. Educational information regarding lower intestinal endoscopy was given to the patient. Written instructions for how to complete the bowel prep using Miralax were provided. The importance of drinking ample fluids to avoid dehydration as a result of the prep emphasized.  HPI, Physical Exam, Assessment and Plan have been scribed under the direction and in the presence of Mckinley Jewel, MD  Gaspar Cola, CMA  I have completed the exam and reviewed the above documentation for accuracy and completeness.  I agree with the above.  Haematologist has been used and any errors in  dictation or transcription are unintentional.  Seeplaputhur G. Jamal Collin, M.D., F.A.C.S.     Junie Panning Darnell Level 08/14/2017, 2:25 PM  Patient has been scheduled for a colonoscopy on 08-28-17 at [facility]. GoLytely prescription has been sent in to the patient's pharmacy today. Patient will make use of 2 bisacodyl tablets on Monday, 08-26-17 (one tablet in the morning and one tablet in the afternoon). Regular GoLytely/bisacodyl prep will be followed on Tuesday, 08-27-17. Colonoscopy instructions have been reviewed with the patient. This patient is aware to call the office if they have further questions.   Dominga Ferry, CMA

## 2017-08-28 NOTE — Interval H&P Note (Signed)
History and Physical Interval Note:  08/28/2017 10:15 AM  Ashlee Richardson  has presented today for surgery, with the diagnosis of screening  The various methods of treatment have been discussed with the patient and family. After consideration of risks, benefits and other options for treatment, the patient has consented to  Procedure(s): COLONOSCOPY WITH PROPOFOL (N/A) as a surgical intervention .  The patient's history has been reviewed, patient examined, no change in status, stable for surgery.  I have reviewed the patient's chart and labs.  Questions were answered to the patient's satisfaction.     SANKAR,SEEPLAPUTHUR G

## 2017-08-28 NOTE — Anesthesia Post-op Follow-up Note (Signed)
Anesthesia QCDR form completed.        

## 2017-08-28 NOTE — Anesthesia Postprocedure Evaluation (Signed)
Anesthesia Post Note  Patient: Ashlee Richardson  Procedure(s) Performed: Procedure(s) (LRB): COLONOSCOPY WITH PROPOFOL (N/A)  Patient location during evaluation: PACU Anesthesia Type: General Level of consciousness: awake and alert and oriented Pain management: pain level controlled Vital Signs Assessment: post-procedure vital signs reviewed and stable Respiratory status: spontaneous breathing Cardiovascular status: blood pressure returned to baseline Anesthetic complications: no     Last Vitals:  Vitals:   08/28/17 1131 08/28/17 1141  BP: 115/66 134/75  Pulse: 78 82  Resp: 16 12  Temp: 36.8 C   SpO2: 100% 100%    Last Pain:  Vitals:   08/28/17 1131  TempSrc: Tympanic  PainSc:                  Marguerite Jarboe

## 2017-08-28 NOTE — Anesthesia Preprocedure Evaluation (Signed)
Anesthesia Evaluation  Patient identified by MRN, date of birth, ID band Patient awake    Reviewed: Allergy & Precautions, NPO status , Patient's Chart, lab work & pertinent test results  Airway Mallampati: II  TM Distance: >3 FB     Dental  (+) Caps   Pulmonary neg pulmonary ROS,    Pulmonary exam normal        Cardiovascular negative cardio ROS Normal cardiovascular exam     Neuro/Psych  Headaches,  Neuromuscular disease negative psych ROS   GI/Hepatic Neg liver ROS, GERD  Medicated,  Endo/Other  negative endocrine ROS  Renal/GU negative Renal ROS  negative genitourinary   Musculoskeletal  (+) Arthritis , Osteoarthritis,  Fibromyalgia -  Abdominal Normal abdominal exam  (+)   Peds negative pediatric ROS (+)  Hematology negative hematology ROS (+)   Anesthesia Other Findings   Reproductive/Obstetrics                             Anesthesia Physical Anesthesia Plan  ASA: II  Anesthesia Plan: General   Post-op Pain Management:    Induction: Intravenous  PONV Risk Score and Plan:   Airway Management Planned: Nasal Cannula  Additional Equipment:   Intra-op Plan:   Post-operative Plan:   Informed Consent: I have reviewed the patients History and Physical, chart, labs and discussed the procedure including the risks, benefits and alternatives for the proposed anesthesia with the patient or authorized representative who has indicated his/her understanding and acceptance.   Dental advisory given  Plan Discussed with: CRNA and Surgeon  Anesthesia Plan Comments:         Anesthesia Quick Evaluation

## 2017-08-29 ENCOUNTER — Encounter: Payer: Self-pay | Admitting: General Surgery

## 2017-09-06 LAB — HM HEPATITIS C SCREENING LAB: HM Hepatitis Screen: NEGATIVE

## 2017-12-27 DIAGNOSIS — R5382 Chronic fatigue, unspecified: Secondary | ICD-10-CM | POA: Insufficient documentation

## 2017-12-27 DIAGNOSIS — E559 Vitamin D deficiency, unspecified: Secondary | ICD-10-CM | POA: Insufficient documentation

## 2017-12-27 DIAGNOSIS — E538 Deficiency of other specified B group vitamins: Secondary | ICD-10-CM | POA: Insufficient documentation

## 2017-12-27 HISTORY — DX: Deficiency of other specified B group vitamins: E53.8

## 2018-01-03 ENCOUNTER — Other Ambulatory Visit: Payer: Self-pay | Admitting: Family Medicine

## 2018-01-03 DIAGNOSIS — R1310 Dysphagia, unspecified: Secondary | ICD-10-CM

## 2018-01-06 LAB — BASIC METABOLIC PANEL: Glucose: 92

## 2018-01-06 LAB — VITAMIN D 25 HYDROXY (VIT D DEFICIENCY, FRACTURES): Vit D, 25-Hydroxy: 32.6

## 2018-01-06 LAB — TSH: TSH: 1.7 (ref 0.41–5.90)

## 2018-01-10 ENCOUNTER — Ambulatory Visit
Admission: RE | Admit: 2018-01-10 | Discharge: 2018-01-10 | Disposition: A | Discharge status: Home / Self Care | Payer: Medicare Other | Source: Ambulatory Visit | Attending: Family Medicine | Admitting: Family Medicine

## 2018-01-10 DIAGNOSIS — R1310 Dysphagia, unspecified: Secondary | ICD-10-CM | POA: Diagnosis present

## 2018-01-10 NOTE — Therapy (Signed)
[place], [place], [address] Phone: [phone]   Fax:     Modified Barium Swallow  Patient Details  Name: Ashlee Richardson MRN: [id number] Date of Birth: Nov 12, 1956 No Data Recorded  Encounter Date: 01/10/2018  End of Session - 01/10/18 1326    Visit Number  1    Number of Visits  1    Date for SLP Re-Evaluation  01/10/18    SLP Start Time  52    SLP Stop Time   1327    SLP Time Calculation (min)  51 min    Activity Tolerance  Patient tolerated treatment well       Past Medical History:  Diagnosis Date  . Diverticulitis   . Fibromyalgia   . GERD (gastroesophageal reflux disease)   . High cholesterol   . Migraine     Past Surgical History:  Procedure Laterality Date  . BACK SURGERY  1991?   Pt usure of date  . COLONOSCOPY  09/2007  . COLONOSCOPY WITH PROPOFOL N/A 08/28/2017   Procedure: COLONOSCOPY WITH PROPOFOL;  Surgeon: Christene Lye, MD;  Location: [facility] ENDOSCOPY;  Service: Endoscopy;  Laterality: N/A;  . Foot sugery Right    3rd toe from the right"    There were no vitals filed for this visit.   Subjective: Patient behavior: (alertness, ability to follow instructions, etc.): The patient is able to verbalize her swallowing history and follow directions  Chief complaint: patient describes foods sticking (indicates mid-esophagus) and moving up to and through UES   Objective:  Radiological Procedure: A videoflouroscopic evaluation of oral-preparatory, reflex initiation, and pharyngeal phases of the swallow was performed; as well as a screening of the upper esophageal phase.  I. POSTURE: Upright in MBS chair  II. VIEW: Lateral  III. COMPENSATORY STRATEGIES: N/A  IV. BOLUSES ADMINISTERED:   Thin Liquid: 2 small cup rim, 3 rapid consecutive   Nectar-thick Liquid: 1 moderate   Honey-thick Liquid: DNT   Puree: 2 teaspoon presentations   Mechanical Soft: 1/4  graham cracker in applesauce  V. RESULTS OF EVALUATION: A. ORAL PREPARATORY PHASE: (The lips, tongue, and velum are observed for strength and coordination)       **Overall Severity Rating: Within normal limits  B. SWALLOW INITIATION/REFLEX: (The reflex is normal if "triggered" by the time the bolus reached the base of the tongue)  **Overall Severity Rating: Mild-moderate- triggers while falling from the valleculae to the pyriform sinuses with thin liquids  C. PHARYNGEAL PHASE: (Pharyngeal function is normal if the bolus shows rapid, smooth, and continuous transit through the pharynx and there is no pharyngeal residue after the swallow)  **Overall Severity Rating: Within normal limits  D. LARYNGEAL PENETRATION: (Material entering into the laryngeal inlet/vestibule but not aspirated) Transient, thin liquid rapid consecutive swallow  E. ASPIRATION: None  F. ESOPHAGEAL PHASE: (Screening of the upper esophagus) During the rapid consecutive drinking, there was retrograde movement of barium through the UES into the pharynx.    ASSESSMENT: This 62 year old woman; with complaint of "eating less and losing weight b/c she is having difficulty swallowing foods. States it gets caught and sometimes comes back up."; is presenting with mild oropharyngeal dysphagia characterized by delayed pharyngeal swallow initiation.  Oral control of the bolus including oral hold, rotary mastication, and anterior to posterior transfer is within normal limits. Aspects of the pharyngeal stage of swallowing including tongue base retraction, hyolaryngeal excursion, epiglottic inversion, and duration/amplitude of UES  opening are within normal limits.  There is no observed pharyngeal residue or tracheal aspiration.  There is transient laryngeal penetration with thin liquid rapid, consecutive drinking.  The patient's complaints do not appear to be due to oropharyngeal swallow function.  During the rapid consecutive drinking, there was  retrograde movement of barium through the UES into the pharynx.  The patient would benefit from referral to GI to assess and manage esophgeal dysphagia.  PLAN/RECOMMENDATIONS:   A. Diet: Regular   B. Swallowing Precautions: try smaller, more frequent meals   C. Recommended consultation to: GI, assess and manage esophageal dysphagia; dietician, dietary recommendations to maximize nutrition   D. Therapy recommendations: speech therapy is not indicated   E. Results and recommendations were discussed with the patient immediately following the study and the final report routed to the referring MD.     Dysphagia, unspecified type - Plan: DG OP Swallowing Func-Medicare/Speech Path, DG OP Swallowing Func-Medicare/Speech Path        Problem List Patient Active Problem List   Diagnosis Date Noted  . DDD (degenerative disc disease), cervical 05/23/2015  . Migraine 05/23/2015  . Bilateral occipital neuralgia 05/23/2015  . DDD (degenerative disc disease), lumbar 05/23/2015  . Multiple sclerosis ([place]) 05/23/2015  . Cervical disc disorder with radiculopathy of cervical region 04/20/2015  . Neck pain, acute 04/20/2015  . Right arm numbness 04/20/2015  . Right arm weakness 04/20/2015   Leroy Sea, MS/CCC- SLP  Lou Miner 01/10/2018, 1:27 PM  [facility] [place], [place], [address] Phone: [phone]   Fax:     Name: Ashlee Richardson MRN: [id number] Date of Birth: 09-23-1956

## 2018-01-28 ENCOUNTER — Ambulatory Visit: Payer: Medicare Other

## 2018-04-09 ENCOUNTER — Ambulatory Visit: Payer: Self-pay | Admitting: Family Medicine

## 2018-04-28 ENCOUNTER — Encounter: Payer: Self-pay | Admitting: Family Medicine

## 2018-04-28 ENCOUNTER — Ambulatory Visit (INDEPENDENT_AMBULATORY_CARE_PROVIDER_SITE_OTHER): Payer: Medicare Other | Admitting: Family Medicine

## 2018-04-28 VITALS — BP 130/68 | HR 88 | Temp 98.1°F | Resp 16 | Ht 67.0 in | Wt 182.3 lb

## 2018-04-28 DIAGNOSIS — G2581 Restless legs syndrome: Secondary | ICD-10-CM | POA: Diagnosis not present

## 2018-04-28 DIAGNOSIS — K279 Peptic ulcer, site unspecified, unspecified as acute or chronic, without hemorrhage or perforation: Secondary | ICD-10-CM | POA: Insufficient documentation

## 2018-04-28 DIAGNOSIS — E538 Deficiency of other specified B group vitamins: Secondary | ICD-10-CM

## 2018-04-28 DIAGNOSIS — M5441 Lumbago with sciatica, right side: Secondary | ICD-10-CM

## 2018-04-28 DIAGNOSIS — E785 Hyperlipidemia, unspecified: Secondary | ICD-10-CM

## 2018-04-28 DIAGNOSIS — E7800 Pure hypercholesterolemia, unspecified: Secondary | ICD-10-CM | POA: Insufficient documentation

## 2018-04-28 DIAGNOSIS — K219 Gastro-esophageal reflux disease without esophagitis: Secondary | ICD-10-CM | POA: Insufficient documentation

## 2018-04-28 DIAGNOSIS — G8929 Other chronic pain: Secondary | ICD-10-CM

## 2018-04-28 DIAGNOSIS — I209 Angina pectoris, unspecified: Secondary | ICD-10-CM

## 2018-04-28 DIAGNOSIS — M797 Fibromyalgia: Secondary | ICD-10-CM | POA: Diagnosis not present

## 2018-04-28 DIAGNOSIS — K5790 Diverticulosis of intestine, part unspecified, without perforation or abscess without bleeding: Secondary | ICD-10-CM | POA: Diagnosis not present

## 2018-04-28 DIAGNOSIS — G35 Multiple sclerosis: Secondary | ICD-10-CM

## 2018-04-28 HISTORY — DX: Peptic ulcer, site unspecified, unspecified as acute or chronic, without hemorrhage or perforation: K27.9

## 2018-04-28 MED ORDER — DULOXETINE HCL 30 MG PO CPEP
30.0000 mg | ORAL_CAPSULE | Freq: Every day | ORAL | 0 refills | Status: DC
Start: 2018-04-28 — End: 2018-05-20

## 2018-04-28 MED ORDER — ROPINIROLE HCL 0.25 MG PO TABS: 0.2500 mg | ORAL_TABLET | Freq: Every day | ORAL | 2 refills | Status: DC

## 2018-04-28 MED ORDER — PREGABALIN 75 MG PO CAPS: 75.0000 mg | ORAL_CAPSULE | Freq: Two times a day (BID) | ORAL | 2 refills | Status: DC

## 2018-04-28 MED ORDER — OMEPRAZOLE 20 MG PO CPDR: 20.0000 mg | DELAYED_RELEASE_CAPSULE | Freq: Every day | ORAL | 2 refills | Status: DC

## 2018-04-28 MED ORDER — CYANOCOBALAMIN 1000 MCG/ML IJ SOLN
1000.0000 ug | Freq: Once | INTRAMUSCULAR | Status: AC
  Administered 2018-04-28: 1000 ug via INTRAMUSCULAR

## 2018-04-28 NOTE — Progress Notes (Signed)
Name: Ashlee Richardson   MRN: [id number]    DOB: Dec 12, 1956   Date:04/28/2018       Progress Note  Subjective  Chief Complaint  Chief Complaint  Patient presents with  . Establish Care    HPI  MS: under the care of Dr. Manuella Ghazi , had a positive IG for herpes on spinal tap but was given reassurance by Dr. Ola Spurr. She denies weakness, but has intermittent tingling an numbness on both arms. Stable and not on medication. MRI showed white plaques. She has memory loss and is supposed to follow up with Dr. Manuella Ghazi and because of snoring a sleep study has been ordered by his office but she not heard about it yet. Advised her to call him back  Chronic back pain and neck pain, also has FMS: used to see Dr. Primus Bravo, currently taking pain medication every other day, pain level is still high at 8/10. She is not compliant with Cymbalta or Lyrica, and on her list also has gabapentin and arthrodec. Advised her to stop nsaids ( allergies but also has PUD and symptoms worse) , and gabapentin since has Lyrica at home. We will refer her to local pain clinic   GERD/PUD: taking Omeprazole twice daily and has stabbing abdominal pain, but no blood in stools, no nausea or vomiting. Advised to stop nsaid's and monitor, may needs to go back to GI  B12 deficiency and Vitamin D deficiency: on oral supplementation for vitamin D but has been off B12  Angina Pectoris: seen by Dr. Clayborn Bigness on statin therapy and needs to have repeat lipid panel, bp is at goal no history of DM, but has positive family history of diabetes   Patient Active Problem List   Diagnosis Date Noted  . GERD (gastroesophageal reflux disease) 04/28/2018  . Pure hypercholesterolemia 04/28/2018  . PUD (peptic ulcer disease) 04/28/2018  . Diverticulosis 04/28/2018  . RLS (restless legs syndrome) 04/28/2018  . Angina pectoris ([place]) 04/28/2018  . B12 deficiency 12/27/2017  . Vitamin D deficiency 12/27/2017  . Chronic fatigue, unspecified 12/27/2017  .  Intractable migraine without aura and without status migrainosus 10/17/2015  . DDD (degenerative disc disease), cervical 05/23/2015  . Bilateral occipital neuralgia 05/23/2015  . DDD (degenerative disc disease), lumbar 05/23/2015  . Multiple sclerosis ([place]) 05/23/2015  . Cervical disc disorder with radiculopathy of cervical region 04/20/2015  . Right arm numbness 04/20/2015  . Right arm weakness 04/20/2015    Past Surgical History:  Procedure Laterality Date  . BACK SURGERY  1991?   Pt usure of date  . BREAST BIOPSY Right   . COLONOSCOPY  09/2007  . COLONOSCOPY WITH PROPOFOL N/A 08/28/2017   Procedure: COLONOSCOPY WITH PROPOFOL;  Surgeon: Christene Lye, MD;  Location: [facility] END[facility]COPY;  Service: Endoscopy;  Laterality: N/A;  . Foot sugery Right    3rd toe from the right"    Family History  Problem Relation Age of Onset  . Heart disease Unknown   . Cancer Unknown        Breast  . Diabetes Unknown     Social History   Socioeconomic History  . Marital status: Single    Spouse name: Not on file  . Number of children: 2  . Years of education: College  . Highest education level: Not on file  Occupational History  . Occupation: Agricultural consultant: [employer]    Comment: part time  Social Needs  . Financial resource strain: Hard  . Food insecurity:  Worry: Never true    Inability: Sometimes true  . Transportation needs:    Medical: No    Non-medical: No  Tobacco Use  . Smoking status: Never Smoker  . Smokeless tobacco: Never Used  Substance and Sexual Activity  . Alcohol use: No    Alcohol/week: 0.0 oz  . Drug use: No  . Sexual activity: Yes    Birth control/protection: Post-menopausal  Lifestyle  . Physical activity:    Days per week: Not on file    Minutes per session: Not on file  . Stress: Not on file  Relationships  . Social connections:    Talks on phone: Not on file    Gets together: Not on file    Attends religious service: Not  on file    Active member of club or organization: Not on file    Attends meetings of clubs or organizations: Not on file    Relationship status: Not on file  . Intimate partner violence:    Fear of current or ex partner: Not on file    Emotionally abused: Not on file    Physically abused: Not on file    Forced sexual activity: Not on file  Other Topics Concern  . Not on file  Social History Narrative   Lives at home by herself.   Disable from chronic back pain since 1993   Diagnosed with MS in the 2000's     Current Outpatient Medications:  .  CRESTOR 10 MG tablet, Take 10 mg by mouth daily., Disp: , Rfl: 6 .  DULoxetine (CYMBALTA) 30 MG capsule, Take 1-2 capsules (30-60 mg total) by mouth daily. 30 mg for the first week after that 2 daily, Disp: 60 capsule, Rfl: 0 .  HYDROcodone-acetaminophen (NORCO/VICODIN) 5-325 MG tablet, Take 1 tablet by mouth every 4 (four) hours as needed., Disp: 15 tablet, Rfl: 0 .  omeprazole (PRILOSEC) 20 MG capsule, Take 1 capsule (20 mg total) by mouth daily., Disp: 60 capsule, Rfl: 2 .  polyethylene glycol (GOLYTELY/NULYTELY) 236 g solution, One bottle for colonoscopy prep., Disp: 4000 mL, Rfl: 0 .  pregabalin (LYRICA) 75 MG capsule, Take 1 capsule (75 mg total) by mouth 2 (two) times daily., Disp: 60 capsule, Rfl: 2 .  rOPINIRole (REQUIP) 0.25 MG tablet, Take 1 tablet (0.25 mg total) by mouth at bedtime., Disp: 30 tablet, Rfl: 2  Current Facility-Administered Medications:  .  cyanocobalamin ((VITAMIN B-12)) injection 1,000 mcg, 1,000 mcg, Intramuscular, Once, Steele Sizer, MD  Allergies  Allergen Reactions  . Fenofibric Acid   . Lipitor [Atorvastatin]   . Nsaids      ROS  Constitutional: Negative for fever or weight change.  Respiratory: Negative for cough and shortness of breath.   Cardiovascular: Negative for chest pain or palpitations.  Gastrointestinal: Negative for abdominal pain, no bowel changes.  Musculoskeletal: Negative for gait  problem or joint swelling.  Skin: Negative for rash.  Neurological: Negative for dizziness or headache.  No other specific complaints in a complete review of systems (except as listed in HPI above).  Objective  Vitals:   04/28/18 1133  BP: 130/68  Pulse: 88  Resp: 16  Temp: 98.1 F (36.7 C)  TempSrc: Oral  SpO2: 96%  Weight: 182 lb 4.8 oz (82.7 kg)  Height: 5\' 7"  (1.702 m)    Body mass index is 28.55 kg/m.  Physical Exam  Constitutional: Patient appears well-developed and well-nourished. Obese  No distress.  HEENT: head atraumatic, normocephalic, pupils equal and reactive  to light,neck supple, throat within normal limits Cardiovascular: Normal rate, regular rhythm and normal heart sounds.  No murmur heard. No BLE edema. Pulmonary/Chest: Effort normal and breath sounds normal. No respiratory distress. Abdominal: Soft.  There is no tenderness. Psychiatric: Patient has a normal mood and affect. behavior is normal. Judgment and thought content normal. Muscular skeletal: pain during palpation of lumbar spine, on left lower back. Negative straight leg raise, negative CVA tenderness   PHQ2/9: Depression screen [facility] 2/9 04/28/2018 04/28/2018  Decreased Interest 0 0  Down, Depressed, Hopeless 0 0  PHQ - 2 Score 0 0     Fall Risk: Fall Risk  04/28/2018 04/28/2018  Falls in the past year? No No     Functional Status Survey: Is the patient deaf or have difficulty hearing?: No Does the patient have difficulty seeing, even when wearing glasses/contacts?: No Does the patient have difficulty concentrating, remembering, or making decisions?: No Does the patient have difficulty walking or climbing stairs?: No Does the patient have difficulty dressing or bathing?: No Does the patient have difficulty doing errands alone such as visiting a doctor's office or shopping?: No    Assessment & Plan  1. Multiple sclerosis (H[facility])  Sees Dr. Manuella Ghazi, but no on medication, she states she feels tired  and has intermittent tingling and numbness but no weakness at this time  2. PUD (peptic ulcer disease)  Stop Arthrodect, allergic to NSAID's and making GERD worse and has a history of PUD - omeprazole (PRILOSEC) 20 MG capsule; Take 1 capsule (20 mg total) by mouth daily.  Dispense: 60 capsule; Refill: 2  3. Diverticulosis   4. Dyslipidemia  - COMPLETE METABOLIC PANEL WITH GFR - Lipid panel  5. Chronic bilateral low back pain with right-sided sciatica  - Ambulatory referral to Pain Clinic - pregabalin (LYRICA) 75 MG capsule; Take 1 capsule (75 mg total) by mouth 2 (two) times daily.  Dispense: 60 capsule; Refill: 2 - DULoxetine (CYMBALTA) 30 MG capsule; Take 1-2 capsules (30-60 mg total) by mouth daily. 30 mg for the first week after that 2 daily  Dispense: 60 capsule; Refill: 0  6. B12 deficiency  - cyanocobalamin ((VITAMIN B-12)) injection 1,000 mcg  7. RLS (restless legs syndrome)  - rOPINIRole (REQUIP) 0.25 MG tablet; Take 1 tablet (0.25 mg total) by mouth at bedtime.  Dispense: 30 tablet; Refill: 2  8. Angina pectoris (HCC)  Sees Dr. Clayborn Bigness   9. Fibromyalgia  - pregabalin (LYRICA) 75 MG capsule; Take 1 capsule (75 mg total) by mouth 2 (two) times daily.  Dispense: 60 capsule; Refill: 2 - DULoxetine (CYMBALTA) 30 MG capsule; Take 1-2 capsules (30-60 mg total) by mouth daily. 30 mg for the first week after that 2 daily  Dispense: 60 capsule; Refill: 0

## 2018-04-29 LAB — COMPLETE METABOLIC PANEL WITH GFR
AG Ratio: 2.1 (calc) (ref 1.0–2.5)
ALT: 14 U/L (ref 6–29)
AST: 15 U/L (ref 10–35)
Albumin: 4.7 g/dL (ref 3.6–5.1)
Alkaline phosphatase (APISO): 63 U/L (ref 33–130)
BUN: 10 mg/dL (ref 7–25)
CO2: 26 mmol/L (ref 20–32)
Calcium: 9.6 mg/dL (ref 8.6–10.4)
Chloride: 105 mmol/L (ref 98–110)
Creat: 0.67 mg/dL (ref 0.50–0.99)
GFR, Est African American: 110 mL/min/{1.73_m2} (ref >=60)
GFR, Est Non African American: 95 mL/min/{1.73_m2} (ref >=60)
Globulin: 2.2 g/dL (calc) (ref 1.9–3.7)
Glucose, Bld: 96 mg/dL (ref 65–99)
Potassium: 4.8 mmol/L (ref 3.5–5.3)
Sodium: 141 mmol/L (ref 135–146)
Total Bilirubin: 0.5 mg/dL (ref 0.2–1.2)
Total Protein: 6.9 g/dL (ref 6.1–8.1)

## 2018-04-29 LAB — LIPID PANEL
Cholesterol: 217 mg/dL — ABNORMAL HIGH (ref <200)
HDL: 74 mg/dL (ref >50)
LDL Cholesterol (Calc): 121 mg/dL (calc) — ABNORMAL HIGH
Non-HDL Cholesterol (Calc): 143 mg/dL (calc) — ABNORMAL HIGH (ref <130)
Total CHOL/HDL Ratio: 2.9 (calc) (ref <5.0)
Triglycerides: 114 mg/dL (ref <150)

## 2018-05-20 ENCOUNTER — Other Ambulatory Visit: Payer: Self-pay | Admitting: Family Medicine

## 2018-05-20 DIAGNOSIS — G8929 Other chronic pain: Secondary | ICD-10-CM

## 2018-05-20 DIAGNOSIS — M797 Fibromyalgia: Secondary | ICD-10-CM

## 2018-05-20 DIAGNOSIS — M5441 Lumbago with sciatica, right side: Principal | ICD-10-CM

## 2018-06-16 ENCOUNTER — Other Ambulatory Visit: Payer: Self-pay

## 2018-06-16 DIAGNOSIS — M797 Fibromyalgia: Secondary | ICD-10-CM

## 2018-06-16 DIAGNOSIS — G8929 Other chronic pain: Secondary | ICD-10-CM

## 2018-06-16 DIAGNOSIS — M5441 Lumbago with sciatica, right side: Principal | ICD-10-CM

## 2018-06-16 MED ORDER — DULOXETINE HCL 60 MG PO CPEP: 60.0000 mg | ORAL_CAPSULE | Freq: Every day | ORAL | 0 refills | Status: DC

## 2018-06-16 NOTE — Telephone Encounter (Signed)
Refill request for general medication: Duloxetine 60 mg  Last office visit: 04/28/2018  Last physical exam: None indicated  Follow-ups on file. 08/26/2018

## 2018-07-20 IMAGING — RF DG FLUORO GUIDE LUMBAR PUNCTURE
1 series · 1 of 1 positions shown · non-contrast
Comparison: none

CLINICAL DATA: Evaluate for possible multiple sclerosis.

[Series 1: cp_standard · 0.18mm/px · 1 of 1 slices shown]
[im 1/1]
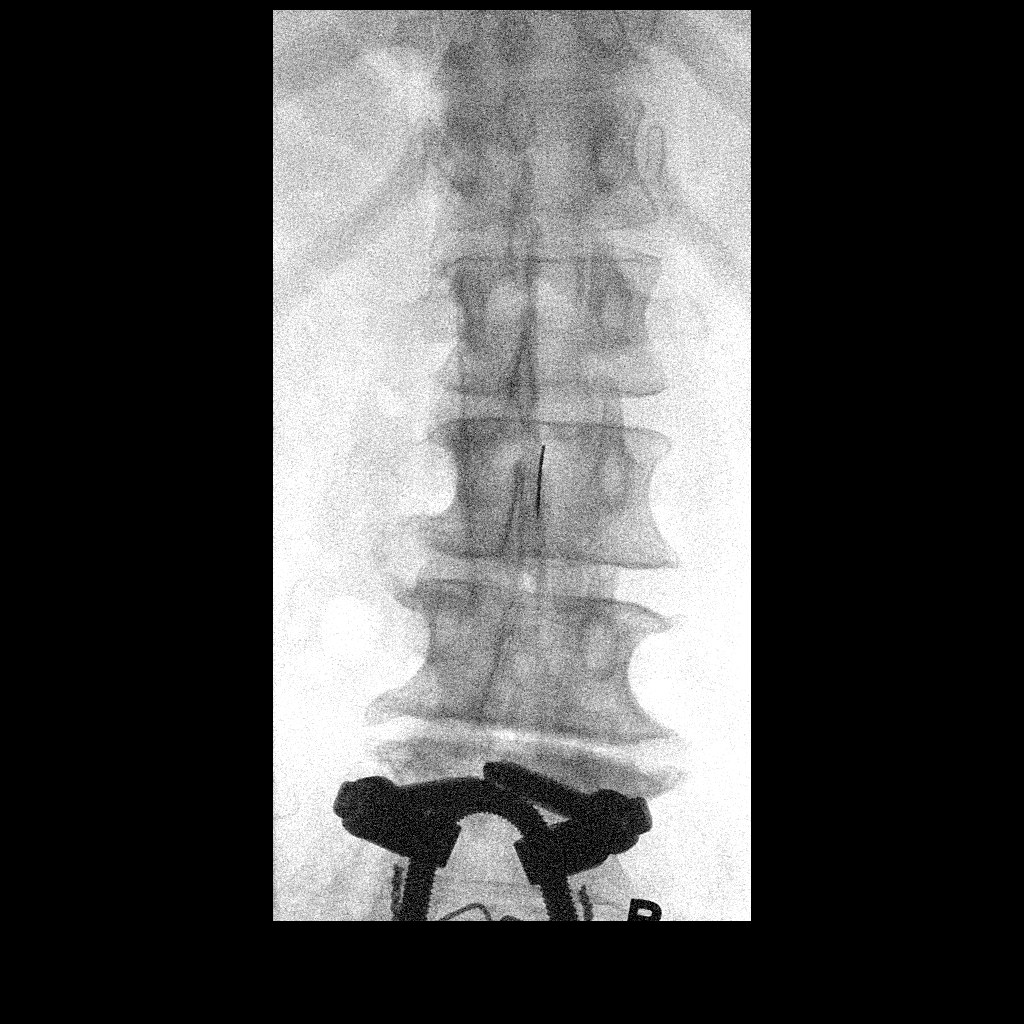

[1 of 1 positions shown; findings below may reference images not displayed]

EXAM:
DIAGNOSTIC LUMBAR PUNCTURE UNDER FLUOROSCOPIC GUIDANCE

FLUOROSCOPY TIME:  Fluoroscopy Time:  1 minutes and 20 seconds

Radiation Exposure Index (if provided by the fluoroscopic device):
21.7 mGy

Number of Acquired Spot Images: 0

PROCEDURE:
Informed consent was obtained from the patient prior to the
procedure, including potential complications of headache, allergy,
and pain. With the patient prone, the lower back was prepped with
Betadine. 1% Lidocaine was used for local anesthesia. Lumbar
puncture was performed at the L2 level using a 22 gauge needle with
return of clear CSF. 9 ml of CSF were obtained for laboratory
studies. The patient tolerated the procedure well and there were no
apparent complications.
IMPRESSION: Fluoroscopic guided lumbar puncture without immediate complications.

## 2018-08-07 ENCOUNTER — Ambulatory Visit (INDEPENDENT_AMBULATORY_CARE_PROVIDER_SITE_OTHER): Payer: Medicare Other

## 2018-08-07 VITALS — BP 114/62 | HR 60 | Temp 98.4°F | Resp 12 | Ht 67.0 in | Wt 173.1 lb

## 2018-08-07 DIAGNOSIS — Z1239 Encounter for other screening for malignant neoplasm of breast: Secondary | ICD-10-CM

## 2018-08-07 DIAGNOSIS — Z0000 Encounter for general adult medical examination without abnormal findings: Secondary | ICD-10-CM | POA: Diagnosis not present

## 2018-08-07 DIAGNOSIS — Z1231 Encounter for screening mammogram for malignant neoplasm of breast: Secondary | ICD-10-CM

## 2018-08-07 NOTE — Progress Notes (Addendum)
Subjective:   Ashlee Richardson is a 62 y.o. female who presents for an Initial Medicare Annual Wellness Visit.  Review of Systems    N/A  Cardiac Risk Factors include: dyslipidemia;sedentary lifestyle     Objective:    Today's Vitals   08/07/18 0844  BP: 114/62  Pulse: 60  Resp: 12  Temp: 98.4 F (36.9 C)  TempSrc: Oral  SpO2: 94%  Weight: 173 lb 1.6 oz (78.5 kg)  Height: 5\' 7"  (1.702 m)   Body mass index is 27.11 kg/m.  Advanced Directives 08/07/2018 08/28/2017 07/30/2017 01/13/2017  Does Patient Have a Medical Advance Directive? No No No No  Would patient like information on creating a medical advance directive? Yes (MAU/Ambulatory/Procedural Areas - Information given) No - Patient declined No - Patient declined -    Current Medications (verified) Outpatient Encounter Medications as of 08/07/2018  Medication Sig  . CRESTOR 10 MG tablet Take 10 mg by mouth daily.  . DULoxetine (CYMBALTA) 60 MG capsule Take 1 capsule (60 mg total) by mouth daily.  Marland Kitchen gabapentin (NEURONTIN) 100 MG capsule Take 100 mg by mouth 2 (two) times daily.  . meclizine (ANTIVERT) 25 MG tablet Take 25 mg by mouth 3 (three) times daily as needed for dizziness.  . meloxicam (MOBIC) 15 MG tablet Take 15 mg by mouth daily.  Marland Kitchen omeprazole (PRILOSEC) 20 MG capsule Take 1 capsule (20 mg total) by mouth daily.  Marland Kitchen rOPINIRole (REQUIP) 0.25 MG tablet Take 1 tablet (0.25 mg total) by mouth at bedtime.  Marland Kitchen HYDROcodone-acetaminophen (NORCO/VICODIN) 5-325 MG tablet Take 1 tablet by mouth every 4 (four) hours as needed. (Patient not taking: Reported on 08/07/2018)  . polyethylene glycol (GOLYTELY/NULYTELY) 236 g solution One bottle for colonoscopy prep.  . pregabalin (LYRICA) 75 MG capsule Take 1 capsule (75 mg total) by mouth 2 (two) times daily. (Patient not taking: Reported on 08/07/2018)   No facility-administered encounter medications on file as of 08/07/2018.     Allergies (verified) Fenofibric acid; Lipitor  [atorvastatin]; and Nsaids   History: Past Medical History:  Diagnosis Date  . Diverticulitis   . Fibromyalgia   . GERD (gastroesophageal reflux disease)   . High cholesterol   . Migraine    Past Surgical History:  Procedure Laterality Date  . BACK SURGERY  1991?   Pt usure of date  . BREAST BIOPSY Right   . COLONOSCOPY  09/2007  . COLONOSCOPY WITH PROPOFOL N/A 08/28/2017   Procedure: COLONOSCOPY WITH PROPOFOL;  Surgeon: Christene Lye, MD;  Location: [facility] ENDOSCOPY;  Service: Endoscopy;  Laterality: N/A;  . Foot sugery Right    3rd toe from the right"   Family History  Problem Relation Age of Onset  . Heart disease Mother   . Diabetes Mother   . Kidney disease Mother   . Hypertension Mother   . Heart disease Father   . Hyperlipidemia Father   . Hypertension Father   . Heart disease Unknown   . Cancer Unknown        Breast  . Diabetes Unknown   . Hyperlipidemia Sister   . Hypertension Sister   . Kidney disease Sister   . Diabetes Sister    Social History   Socioeconomic History  . Marital status: Single    Spouse name: Not on file  . Number of children: 2  . Years of education: Not on file  . Highest education level: Associate degree: academic program  Occupational History  . Occupation: Software engineer  Employer: [employer]    Comment: part time    Employer: DISABLED  Social Needs  . Financial resource strain: Hard  . Food insecurity:    Worry: Never true    Inability: Sometimes true  . Transportation needs:    Medical: No    Non-medical: No  Tobacco Use  . Smoking status: Never Smoker  . Smokeless tobacco: Never Used  . Tobacco comment: smoking cessation materials not required  Substance and Sexual Activity  . Alcohol use: No    Alcohol/week: 0.0 standard drinks  . Drug use: No  . Sexual activity: Yes    Birth control/protection: Post-menopausal  Lifestyle  . Physical activity:    Days per week: 0 days    Minutes per session: 0  min  . Stress: To some extent  Relationships  . Social connections:    Talks on phone: Patient refused    Gets together: Patient refused    Attends religious service: Patient refused    Active member of club or organization: Patient refused    Attends meetings of clubs or organizations: Patient refused    Relationship status: Patient refused  Other Topics Concern  . Not on file  Social History Narrative   Lives at home by herself.   Disable from chronic back pain since 1993   Diagnosed with MS in the 2000's    Tobacco Counseling Counseling given: No Comment: smoking cessation materials not required  Clinical Intake:  Pre-visit preparation completed: Yes  Pain : No/denies pain   BMI - recorded: 27.11 Nutritional Status: BMI 25 -29 Overweight Nutritional Risks: None Diabetes: No  How often do you need to have someone help you when you read instructions, pamphlets, or other written materials from your doctor or pharmacy?: 1 - Never  Interpreter Needed?: No  Information entered by :: AEversole, LPN   Activities of Daily Living In your present state of health, do you have any difficulty performing the following activities: 08/07/2018 04/28/2018  Hearing? N N  Comment denies hearing aids -  Vision? N N  Comment wears eyeglasses -  Difficulty concentrating or making decisions? N N  Walking or climbing stairs? Y N  Comment chronic pain -  Dressing or bathing? N N  Doing errands, shopping? N N  Preparing Food and eating ? N -  Comment denies dentures -  Using the Toilet? N -  In the past six months, have you accidently leaked urine? Y -  Comment stress incontinence -  Do you have problems with loss of bowel control? Y -  Comment diverticulitis -  Managing your Medications? N -  Managing your Finances? N -  Housekeeping or managing your Housekeeping? N -  Some recent data might be hidden     Immunizations and Health Maintenance Immunization History  Administered  Date(s) Administered  . Td 12/25/2015   Health Maintenance Due  Topic Date Due  . PAP SMEAR  12/06/1977  . MAMMOGRAM  06/12/2018  . INFLUENZA VACCINE  07/24/2018    Patient Care Team: Steele Sizer, MD as PCP - General (Family Medicine) Christene Lye, MD as Consulting Physician (General Surgery) Vladimir Crofts, MD as Consulting Physician (Neurology) Yolonda Kida, MD as Consulting Physician (Cardiology) Elvina Mattes, Rodman Key, DPM as Consulting Physician (Podiatry)  Indicate any recent [place] you may have received from other than [facility] providers in the past year (date may be approximate).     Assessment:   This is a routine wellness examination for [place].  Hearing/Vision screen Vision Screening Comments: [place] for annual eye exams  Dietary issues and exercise activities discussed: Current Exercise Habits: The patient does not participate in regular exercise at present, Exercise limited by: None identified  Goals    . DIET - INCREASE WATER INTAKE     Recommend to drink at least 6-8 8oz glasses of water per day.      Depression Screen PHQ 2/9 Scores 08/07/2018 04/28/2018 04/28/2018  PHQ - 2 Score 2 0 0  PHQ- 9 Score 7 - -    Fall Risk Fall Risk  08/07/2018 04/28/2018 04/28/2018  Falls in the past year? Yes No No  Number falls in past yr: 1 - -  Injury with Fall? Yes - -  Comment concussion - -  Risk Factor Category  High Fall Risk - -  Risk for fall due to : Impaired vision;Impaired balance/gait;Medication side effect - -  Risk for fall due to: Comment wears eyeglasses; MS; vertigo - -  Follow up Falls evaluation completed;Education provided;Falls prevention discussed - -    FALL RISK PREVENTION PERTAINING TO HOME: Is your home free of loose throw rugs in walkways, pet beds, electrical cords, etc? Yes Is there adequate lighting in your home to reduce risk of falls?  Yes Are there stairs in or around your home WITH handrails?  Yes  ASSISTIVE DEVICES UTILIZED TO PREVENT FALLS: Use of a cane, walker or w/c? No Grab bars in the bathroom? No  Shower chair or a place to sit while bathing? No An elevated toilet seat or a handicapped toilet? No  Timed Get Up and Go Performed: Yes. Pt ambulated 10 feet within 10 sec. Gait stead-fast and without the use of an assistive device. No intervention required at this time. Fall risk prevention has been discussed.  Community Resource Referral:  Pt declined my offer to send [employer] Referral to Care Guide for installation of grab bars in the shower, shower chair or an elevated toilet seat.  Cognitive Function:     6CIT Screen 08/07/2018  What Year? 0 points  What month? 0 points  What time? 0 points  Count back from 20 0 points  Months in reverse 2 points  Repeat phrase 4 points  Total Score 6    Screening Tests Health Maintenance  Topic Date Due  . PAP SMEAR  12/06/1977  . MAMMOGRAM  06/12/2018  . INFLUENZA VACCINE  07/24/2018  . TETANUS/TDAP  12/24/2025  . COLONOSCOPY  08/29/2027  . Hepatitis C Screening  Completed  . HIV Screening  Completed    Qualifies for Shingles Vaccine? Yes. Due for Shingrix. Education has been provided regarding the importance of this vaccine. Pt has been advised to call insurance company to determine out of pocket expense. Advised may also receive vaccine at local pharmacy or Health Dept. Verbalized acceptance and understanding.  Overdue for Flu vaccine. Education has been provided regarding the importance of this vaccine and advised to receive when available. Verbalized acceptance and understanding.  Cancer Screenings: Lung: Low Dose CT Chest recommended if Age 66-80 years, 30 pack-year currently smoking OR have quit w/in 15years. Patient does not qualify. Breast: Up to date on Mammogram? No. Completed 06/12/17. Ordered today. Provided with contact info and advised to schedule appt.   Bone Density/Dexa: Not yet  required Colorectal: Completed 08/28/17. Repeat every 10 years  Additional Screenings: Hepatitis C Screening: Completed 09/06/17   Plan:  I have personally reviewed and addressed the Medicare Annual Wellness questionnaire  and have noted the following in the patient's chart:  A. Medical and social history B. Use of alcohol, tobacco or illicit drugs  C. Current medications and supplements D. Functional ability and status E.  Nutritional status F.  Physical activity G. Advance directives H. List of other physicians I.  Hospitalizations, surgeries, and ER visits in previous 12 months J.  [place] such as hearing and vision if needed, cognitive and depression L. Referrals and appointments  In addition, I have reviewed and discussed with patient certain preventive protocols, quality metrics, and best practice recommendations. A written personalized care plan for preventive services as well as general preventive health recommendations were provided to patient.  See attached scanned questionnaire for additional information.   Signed,  Aleatha Borer, LPN Nurse Health Advisor  I have reviewed this encounter including the documentation in this note and/or discussed this patient with the provider, Aleatha Borer, LPN. I am certifying that I agree with the content of this note as supervising physician.  Steele Sizer, MD [facility] 08/07/2018, 12:52 PM

## 2018-08-07 NOTE — Patient Instructions (Signed)
Ashlee Richardson , Thank you for taking time to come for your Medicare Wellness Visit. I appreciate your ongoing commitment to your health goals. Please review the following plan we discussed and let me know if I can assist you in the future.   Screening recommendations/referrals: Colorectal Screening: Up to date Mammogram: Ordered today Bone Density: Not yet required  Vision and Dental Exams: Recommended annual ophthalmology exams for early detection of glaucoma and other disorders of the eye Recommended annual dental exams for proper oral hygiene  Vaccinations: Influenza vaccine: Overdue Pneumococcal vaccine: Not yet required Tdap vaccine: Up to date Shingles vaccine: Please call your insurance company to determine your out of pocket expense for the Shingrix vaccine. You may also receive this vaccine at your local pharmacy or Health Dept.    Advanced directives: Advance directive discussed with you today. I have provided a copy for you to complete at home and have notarized. Once this is complete please bring a copy in to our office so we can scan it into your chart.  Goals: Recommend to drink at least 6-8 8oz glasses of water per day.  Next appointment: Please schedule your Annual Wellness Visit with your Nurse Health Advisor in one year.  Preventive Care 26 Years and Older, Female Preventive care refers to lifestyle choices and visits with your health care provider that can promote health and wellness. What does preventive care include?  A yearly physical exam. This is also called an annual well check.  Dental exams once or twice a year.  Routine eye exams. Ask your health care provider how often you should have your eyes checked.  Personal lifestyle choices, including:  Daily care of your teeth and gums.  Regular physical activity.  Eating a healthy diet.  Avoiding tobacco and drug use.  Limiting alcohol use.  Practicing safe sex.  Taking low-dose aspirin every  day.  Taking vitamin and mineral supplements as recommended by your health care provider. What happens during an annual well check? The services and screenings done by your health care provider during your annual well check will depend on your age, overall health, lifestyle risk factors, and family history of disease. Counseling  Your health care provider may ask you questions about your:  Alcohol use.  Tobacco use.  Drug use.  Emotional well-being.  Home and relationship well-being.  Sexual activity.  Eating habits.  History of falls.  Memory and ability to understand (cognition).  Work and work Statistician.  Reproductive health. Screening  You may have the following tests or measurements:  Height, weight, and BMI.  Blood pressure.  Lipid and cholesterol levels. These may be checked every 5 years, or more frequently if you are over 48 years old.  Skin check.  Lung cancer screening. You may have this screening every year starting at age 96 if you have a 30-pack-year history of smoking and currently smoke or have quit within the past 15 years.  Fecal occult blood test (FOBT) of the stool. You may have this test every year starting at age 64.  Flexible sigmoidoscopy or colonoscopy. You may have a sigmoidoscopy every 5 years or a colonoscopy every 10 years starting at age 21.  Hepatitis C blood test.  Hepatitis B blood test.  Sexually transmitted disease (STD) testing.  Diabetes screening. This is done by checking your blood sugar (glucose) after you have not eaten for a while (fasting). You may have this done every 1-3 years.  Bone density scan. This is done to  screen for osteoporosis. You may have this done starting at age 18.  Mammogram. This may be done every 1-2 years. Talk to your health care provider about how often you should have regular mammograms. Talk with your health care provider about your test results, treatment options, and if necessary, the need  for more tests. Vaccines  Your health care provider may recommend certain vaccines, such as:  Influenza vaccine. This is recommended every year.  Tetanus, diphtheria, and acellular pertussis (Tdap, Td) vaccine. You may need a Td booster every 10 years.  Zoster vaccine. You may need this after age 29.  Pneumococcal 13-valent conjugate (PCV13) vaccine. One dose is recommended after age 46.  Pneumococcal polysaccharide (PPSV23) vaccine. One dose is recommended after age 13. Talk to your health care provider about which screenings and vaccines you need and how often you need them. This information is not intended to replace advice given to you by your health care provider. Make sure you discuss any questions you have with your health care provider. Document Released: 01/06/2016 Document Revised: 08/29/2016 Document Reviewed: 10/11/2015 Elsevier Interactive Patient Education  2017 [place] Prevention in the Home Falls can cause injuries. They can happen to people of all ages. There are many things you can do to make your home safe and to help prevent falls. What can I do on the outside of my home?  Regularly fix the edges of walkways and driveways and fix any cracks.  Remove anything that might make you trip as you walk through a door, such as a raised step or threshold.  Trim any bushes or trees on the path to your home.  Use bright outdoor lighting.  Clear any walking paths of anything that might make someone trip, such as rocks or tools.  Regularly check to see if handrails are loose or broken. Make sure that both sides of any steps have handrails.  Any raised decks and porches should have guardrails on the edges.  Have any leaves, snow, or ice cleared regularly.  Use sand or salt on walking paths during winter.  Clean up any spills in your garage right away. This includes oil or grease spills. What can I do in the bathroom?  Use night lights.  Install grab bars  by the toilet and in the tub and shower. Do not use towel bars as grab bars.  Use non-skid mats or decals in the tub or shower.  If you need to sit down in the shower, use a plastic, non-slip stool.  Keep the floor dry. Clean up any water that spills on the floor as soon as it happens.  Remove soap buildup in the tub or shower regularly.  Attach bath mats securely with double-sided non-slip rug tape.  Do not have throw rugs and other things on the floor that can make you trip. What can I do in the bedroom?  Use night lights.  Make sure that you have a light by your bed that is easy to reach.  Do not use any sheets or blankets that are too big for your bed. They should not hang down onto the floor.  Have a firm chair that has side arms. You can use this for support while you get dressed.  Do not have throw rugs and other things on the floor that can make you trip. What can I do in the kitchen?  Clean up any spills right away.  Avoid walking on wet floors.  Keep items that  you use a lot in easy-to-reach places.  If you need to reach something above you, use a strong step stool that has a grab bar.  Keep electrical cords out of the way.  Do not use floor polish or wax that makes floors slippery. If you must use wax, use non-skid floor wax.  Do not have throw rugs and other things on the floor that can make you trip. What can I do with my stairs?  Do not leave any items on the stairs.  Make sure that there are handrails on both sides of the stairs and use them. Fix handrails that are broken or loose. Make sure that handrails are as long as the stairways.  Check any carpeting to make sure that it is firmly attached to the stairs. Fix any carpet that is loose or worn.  Avoid having throw rugs at the top or bottom of the stairs. If you do have throw rugs, attach them to the floor with carpet tape.  Make sure that you have a light switch at the top of the stairs and the  bottom of the stairs. If you do not have them, ask someone to add them for you. What else can I do to help prevent falls?  Wear shoes that:  Do not have high heels.  Have rubber bottoms.  Are comfortable and fit you well.  Are closed at the toe. Do not wear sandals.  If you use a stepladder:  Make sure that it is fully opened. Do not climb a closed stepladder.  Make sure that both sides of the stepladder are locked into place.  Ask someone to hold it for you, if possible.  Clearly mark and make sure that you can see:  Any grab bars or handrails.  First and last steps.  Where the edge of each step is.  Use tools that help you move around (mobility aids) if they are needed. These include:  Canes.  Walkers.  Scooters.  Crutches.  Turn on the lights when you go into a dark area. Replace any light bulbs as soon as they burn out.  Set up your furniture so you have a clear path. Avoid moving your furniture around.  If any of your floors are uneven, fix them.  If there are any pets around you, be aware of where they are.  Review your medicines with your doctor. Some medicines can make you feel dizzy. This can increase your chance of falling. Ask your doctor what other things that you can do to help prevent falls. This information is not intended to replace advice given to you by your health care provider. Make sure you discuss any questions you have with your health care provider. Document Released: 10/06/2009 Document Revised: 05/17/2016 Document Reviewed: 01/14/2015 Elsevier Interactive Patient Education  2017 [employer].

## 2018-08-26 ENCOUNTER — Other Ambulatory Visit (HOSPITAL_COMMUNITY)
Admission: RE | Admit: 2018-08-26 | Discharge: 2018-08-26 | Disposition: A | Discharge status: Home / Self Care | Payer: Medicare Other | Source: Ambulatory Visit | Attending: Family Medicine | Admitting: Family Medicine

## 2018-08-26 ENCOUNTER — Ambulatory Visit (INDEPENDENT_AMBULATORY_CARE_PROVIDER_SITE_OTHER): Payer: Medicare Other | Admitting: Family Medicine

## 2018-08-26 ENCOUNTER — Ambulatory Visit: Payer: Medicare Other

## 2018-08-26 ENCOUNTER — Encounter

## 2018-08-26 ENCOUNTER — Encounter: Payer: Self-pay | Admitting: Family Medicine

## 2018-08-26 VITALS — BP 118/62 | HR 74 | Temp 98.0°F | Resp 18 | Ht 67.0 in | Wt 172.5 lb

## 2018-08-26 DIAGNOSIS — E538 Deficiency of other specified B group vitamins: Secondary | ICD-10-CM | POA: Diagnosis not present

## 2018-08-26 DIAGNOSIS — Z23 Encounter for immunization: Secondary | ICD-10-CM

## 2018-08-26 DIAGNOSIS — G2581 Restless legs syndrome: Secondary | ICD-10-CM

## 2018-08-26 DIAGNOSIS — Z124 Encounter for screening for malignant neoplasm of cervix: Secondary | ICD-10-CM | POA: Diagnosis present

## 2018-08-26 DIAGNOSIS — I209 Angina pectoris, unspecified: Secondary | ICD-10-CM | POA: Diagnosis not present

## 2018-08-26 DIAGNOSIS — E559 Vitamin D deficiency, unspecified: Secondary | ICD-10-CM

## 2018-08-26 DIAGNOSIS — R634 Abnormal weight loss: Secondary | ICD-10-CM | POA: Diagnosis not present

## 2018-08-26 DIAGNOSIS — Z113 Encounter for screening for infections with a predominantly sexual mode of transmission: Secondary | ICD-10-CM | POA: Diagnosis not present

## 2018-08-26 DIAGNOSIS — Z131 Encounter for screening for diabetes mellitus: Secondary | ICD-10-CM

## 2018-08-26 DIAGNOSIS — G8929 Other chronic pain: Secondary | ICD-10-CM

## 2018-08-26 DIAGNOSIS — R7303 Prediabetes: Secondary | ICD-10-CM | POA: Diagnosis not present

## 2018-08-26 DIAGNOSIS — R1010 Upper abdominal pain, unspecified: Secondary | ICD-10-CM

## 2018-08-26 DIAGNOSIS — E785 Hyperlipidemia, unspecified: Secondary | ICD-10-CM

## 2018-08-26 DIAGNOSIS — R5383 Other fatigue: Secondary | ICD-10-CM | POA: Diagnosis not present

## 2018-08-26 DIAGNOSIS — Z1231 Encounter for screening mammogram for malignant neoplasm of breast: Secondary | ICD-10-CM

## 2018-08-26 DIAGNOSIS — M5441 Lumbago with sciatica, right side: Secondary | ICD-10-CM

## 2018-08-26 MED ORDER — GABAPENTIN 300 MG PO CAPS: 300.0000 mg | ORAL_CAPSULE | Freq: Every day | ORAL | 0 refills | Status: DC

## 2018-08-26 MED ORDER — ROSUVASTATIN CALCIUM 20 MG PO TABS: 20.0000 mg | ORAL_TABLET | Freq: Every day | ORAL | 1 refills | Status: DC

## 2018-08-26 NOTE — Progress Notes (Signed)
Name: Ashlee Richardson   MRN: [id number]    DOB: Aug 25, 1956   Date:08/26/2018       Progress Note  Subjective  Chief Complaint  Chief Complaint  Patient presents with  . Annual Exam  . Immunizations    patient declines flu shot  . Breast Problem    area under her right breast was swollen  . Fatigue    cannot sleep at night, only sleeping about 4hrs/night  . Leg Pain    patient stated that she gets lots of leg cramps    HPI  Insomnia: she has RLS and chronic low back pain and states has leg numbness and cramping at night that has been affecting her sleep. She states pain wakes her up at night. She has not been taking B12 supplementation She only takes her medication prn, explained that requip and gabapentin can help with symptoms if she takes it every night. Phq 9 can be high secondary to fatigue, she is not taking duloxetine but does not seem to be depressed  Cervical cancer screen: not sure of last pap , she has been dating for the past 15 years. She states she had an abnormal pap many years ago  Breast problem: she states she was lifting something heavy a few weeks ago an developed swelling and pain under right arm, pain and swelling has resolved, she tried scheduling mammogram and was told we had to schedule it for her  Unintentional weight loss: she states her weight was close to 200 lbs one year ago prior to episode of diverticulitis, she still has difficulty eating, she states she has burning sensation on her stomach even with water, not seen by gastroenterologist. She is taking omeprazole without help. We will refer her to GI   Patient Active Problem List   Diagnosis Date Noted  . GERD (gastroesophageal reflux disease) 04/28/2018  . Pure hypercholesterolemia 04/28/2018  . PUD (peptic ulcer disease) 04/28/2018  . Diverticulosis 04/28/2018  . RLS (restless legs syndrome) 04/28/2018  . Angina pectoris ([place]) 04/28/2018  . B12 deficiency 12/27/2017  . Vitamin D deficiency  12/27/2017  . Chronic fatigue, unspecified 12/27/2017  . Intractable migraine without aura and without status migrainosus 10/17/2015  . DDD (degenerative disc disease), cervical 05/23/2015  . Bilateral occipital neuralgia 05/23/2015  . DDD (degenerative disc disease), lumbar 05/23/2015  . Multiple sclerosis ([place]) 05/23/2015  . Cervical disc disorder with radiculopathy of cervical region 04/20/2015  . Right arm numbness 04/20/2015  . Right arm weakness 04/20/2015    Past Surgical History:  Procedure Laterality Date  . BACK SURGERY  1991?   Pt usure of date  . BREAST BIOPSY Right   . COLONOSCOPY  09/2007  . COLONOSCOPY WITH PROPOFOL N/A 08/28/2017   Procedure: COLONOSCOPY WITH PROPOFOL;  Surgeon: Christene Lye, MD;  Location: [facility] ENDOSCOPY;  Service: Endoscopy;  Laterality: N/A;  . Foot sugery Right    3rd toe from the right"    Family History  Problem Relation Age of Onset  . Heart disease Mother   . Diabetes Mother   . Kidney disease Mother   . Hypertension Mother   . Heart disease Father   . Hyperlipidemia Father   . Hypertension Father   . Heart disease Unknown   . Cancer Unknown        Breast  . Diabetes Unknown   . Hyperlipidemia Sister   . Hypertension Sister   . Kidney disease Sister   . Diabetes Sister  Social History   Socioeconomic History  . Marital status: Single    Spouse name: Not on file  . Number of children: 2  . Years of education: Not on file  . Highest education level: Associate degree: academic program  Occupational History  . Occupation: Agricultural consultant: [employer]    Comment: part time    Employer: [employer]  . Financial resource strain: Hard  . Food insecurity:    Worry: Never true    Inability: Sometimes true  . Transportation needs:    Medical: No    Non-medical: No  Tobacco Use  . Smoking status: Never Smoker  . Smokeless tobacco: Never Used  . Tobacco comment: smoking cessation  materials not required  Substance and Sexual Activity  . Alcohol use: No    Alcohol/week: 0.0 standard drinks  . Drug use: No  . Sexual activity: Yes    Birth control/protection: Post-menopausal  Lifestyle  . Physical activity:    Days per week: 0 days    Minutes per session: 0 min  . Stress: To some extent  Relationships  . Social connections:    Talks on phone: More than three times a week    Gets together: More than three times a week    Attends religious service: More than 4 times per year    Active member of club or organization: Yes    Attends meetings of clubs or organizations: More than 4 times per year    Relationship status: Never married  . Intimate partner violence:    Fear of current or ex partner: No    Emotionally abused: No    Physically abused: No    Forced sexual activity: No  Other Topics Concern  . Not on file  Social History Narrative   Lives at home by herself.   Disable from chronic back pain since 1993   Diagnosed with MS in the 2000's     Current Outpatient Medications:  .  CRESTOR 10 MG tablet, Take 10 mg by mouth daily., Disp: , Rfl: 6 .  gabapentin (NEURONTIN) 100 MG capsule, Take 100 mg by mouth 2 (two) times daily., Disp: , Rfl:  .  omeprazole (PRILOSEC) 20 MG capsule, Take 1 capsule (20 mg total) by mouth daily., Disp: 60 capsule, Rfl: 2 .  DULoxetine (CYMBALTA) 60 MG capsule, Take 1 capsule (60 mg total) by mouth daily. (Patient not taking: Reported on 08/26/2018), Disp: 30 capsule, Rfl: 0 .  HYDROcodone-acetaminophen (NORCO/VICODIN) 5-325 MG tablet, Take 1 tablet by mouth every 4 (four) hours as needed. (Patient not taking: Reported on 08/07/2018), Disp: 15 tablet, Rfl: 0 .  meclizine (ANTIVERT) 25 MG tablet, Take 25 mg by mouth 3 (three) times daily as needed for dizziness., Disp: , Rfl:  .  meloxicam (MOBIC) 15 MG tablet, Take 15 mg by mouth daily., Disp: , Rfl:  .  polyethylene glycol (GOLYTELY/NULYTELY) 236 g solution, One bottle for  colonoscopy prep., Disp: 4000 mL, Rfl: 0 .  pregabalin (LYRICA) 75 MG capsule, Take 1 capsule (75 mg total) by mouth 2 (two) times daily. (Patient not taking: Reported on 08/07/2018), Disp: 60 capsule, Rfl: 2 .  rOPINIRole (REQUIP) 0.25 MG tablet, Take 1 tablet (0.25 mg total) by mouth at bedtime. (Patient not taking: Reported on 08/26/2018), Disp: 30 tablet, Rfl: 2  Allergies  Allergen Reactions  . Fenofibric Acid   . Lipitor [Atorvastatin]   . Nsaids      ROS  Constitutional: Negative for fever , positive for weight change.  Respiratory: Negative for cough and shortness of breath.   Cardiovascular: Negative for chest pain or palpitations.  Gastrointestinal: positive for abdominal pain, no bowel changes.  Musculoskeletal: Negative for gait problem or joint swelling.  Skin: Negative for rash.  Neurological: Negative for dizziness or headache.  No other specific complaints in a complete review of systems (except as listed in HPI above).  Objective  Vitals:   08/26/18 1412  BP: 118/62  Pulse: 74  Resp: 18  Temp: 98 F (36.7 C)  TempSrc: Oral  SpO2: 96%  Weight: 172 lb 8 oz (78.2 kg)  Height: 5\' 7"  (1.702 m)    Body mass index is 27.02 kg/m.  Physical Exam  Constitutional: Patient appears well-developed and overweight. No distress.  HENT: Head: Normocephalic and atraumatic. Ears: B TMs ok, no erythema or effusion; Nose: Nose normal. Mouth/Throat: Oropharynx is clear and moist. No oropharyngeal exudate.  Eyes: Conjunctivae and EOM are normal. Pupils are equal, round, and reactive to light. No scleral icterus.  Neck: Normal range of motion. Neck supple. No JVD present. No thyromegaly present.  Cardiovascular: Normal rate, regular rhythm and normal heart sounds.  No murmur heard. No BLE edema. Pulmonary/Chest: Effort normal and breath sounds normal. No respiratory distress. Abdominal: Soft. Bowel sounds are normal, no distension. There is  Tenderness upper abdomen . no  masses Breast: no lumps or masses, no nipple discharge or rashes FEMALE GENITALIA:  External genitalia normal External urethra normal Vaginal vault normal without discharge or lesions Cervix normal without discharge or lesions Bimanual exam normal without masses RECTAL: not  done Musculoskeletal: Normal range of motion, no joint effusions. No gross deformities Neurological: he is alert and oriented to person, place, and time. No cranial nerve deficit. Coordination, balance, strength, speech and gait are normal.  Skin: Skin is warm and dry. No rash noted. No erythema.  Psychiatric: Patient has a normal mood and affect. behavior is normal. Judgment and thought content normal.  PHQ2/9: Depression screen [facility] 2/9 08/26/2018 08/07/2018 04/28/2018 04/28/2018  Decreased Interest 0 1 0 0  Down, Depressed, Hopeless 0 1 0 0  PHQ - 2 Score 0 2 0 0  Altered sleeping 3 1 - -  Tired, decreased energy 3 1 - -  Change in appetite 3 1 - -  Feeling bad or failure about yourself  0 0 - -  Trouble concentrating 0 1 - -  Moving slowly or fidgety/restless 0 1 - -  Suicidal thoughts 0 0 - -  PHQ-9 Score 9 7 - -  Difficult doing work/chores Somewhat difficult Somewhat difficult - -    Fall Risk: Fall Risk  08/26/2018 08/07/2018 04/28/2018 04/28/2018  Falls in the past year? Yes Yes No No  Number falls in past yr: 1 1 - -  Injury with Fall? Yes Yes - -  Comment - concussion - -  Risk Factor Category  High Fall Risk High Fall Risk - -  Risk for fall due to : History of fall(s);Impaired balance/gait;Impaired vision Impaired vision;Impaired balance/gait;Medication side effect - -  Risk for fall due to: Comment - wears eyeglasses; MS; vertigo - -  Follow up Falls evaluation completed Falls evaluation completed;Education provided;Falls prevention discussed - -      Functional Status Survey: Is the patient deaf or have difficulty hearing?: No(denies hearing aids) Does the patient have difficulty seeing, even when  wearing glasses/contacts?: No(wears eyeglasses) Does the patient have difficulty concentrating, remembering, or  making decisions?: No Does the patient have difficulty walking or climbing stairs?: Yes(chronic pain) Does the patient have difficulty dressing or bathing?: No Does the patient have difficulty doing errands alone such as visiting a doctor's office or shopping?: No    Assessment & Plan  1. Unintentional weight loss  - CBC with Differential/Platelet - COMPLETE METABOLIC PANEL WITH GFR - TSH  2. Diabetes mellitus screening  - Hemoglobin A1c  3. Routine screening for STI (sexually transmitted infection)  - HIV antibody - RPR  4. B12 deficiency  - Vitamin B12  5. RLS (restless legs syndrome)  She needs to resume medication   6. Vitamin D deficiency  - VITAMIN D 25 Hydroxy (Vit-D Deficiency, Fractures)  7. Encounter for screening mammogram for breast cancer  Already scheduled   8. Encounter for Papanicolaou smear for cervical cancer screening  - Cytology - PAP  9. Dyslipidemia  - rosuvastatin (CRESTOR) 20 MG tablet; Take 1 tablet (20 mg total) by mouth daily.  Dispense: 90 tablet; Refill: 1  10. Chronic bilateral low back pain with right-sided sciatica  - gabapentin (NEURONTIN) 300 MG capsule; Take 1 capsule (300 mg total) by mouth at bedtime.  Dispense: 90 capsule; Refill: 0  11. Needs flu shot  refused  12. Pain of upper abdomen  - Ambulatory referral to Gastroenterology

## 2018-08-27 LAB — CBC WITH DIFFERENTIAL/PLATELET
Basophils Absolute: 38 cells/uL (ref 0–200)
Basophils Relative: 0.8 %
Eosinophils Absolute: 72 cells/uL (ref 15–500)
Eosinophils Relative: 1.5 %
HCT: 41.5 % (ref 35.0–45.0)
Hemoglobin: 13.6 g/dL (ref 11.7–15.5)
Lymphs Abs: 1949 cells/uL (ref 850–3900)
MCH: 27 pg (ref 27.0–33.0)
MCHC: 32.8 g/dL (ref 32.0–36.0)
MCV: 82.3 fL (ref 80.0–100.0)
MPV: 12.4 fL (ref 7.5–12.5)
Monocytes Relative: 7.1 %
Neutro Abs: 2400 cells/uL (ref 1500–7800)
Neutrophils Relative %: 50 %
Platelets: 227 10*3/uL (ref 140–400)
RBC: 5.04 10*6/uL (ref 3.80–5.10)
RDW: 12.5 % (ref 11.0–15.0)
Total Lymphocyte: 40.6 %
WBC mixed population: 341 cells/uL (ref 200–950)
WBC: 4.8 10*3/uL (ref 3.8–10.8)

## 2018-08-27 LAB — COMPLETE METABOLIC PANEL WITH GFR
AG Ratio: 1.8 (calc) (ref 1.0–2.5)
ALT: 18 U/L (ref 6–29)
AST: 21 U/L (ref 10–35)
Albumin: 4.9 g/dL (ref 3.6–5.1)
Alkaline phosphatase (APISO): 68 U/L (ref 33–130)
BUN: 11 mg/dL (ref 7–25)
CO2: 29 mmol/L (ref 20–32)
Calcium: 10.2 mg/dL (ref 8.6–10.4)
Chloride: 103 mmol/L (ref 98–110)
Creat: 0.92 mg/dL (ref 0.50–0.99)
GFR, Est African American: 78 mL/min/{1.73_m2} (ref >=60)
GFR, Est Non African American: 67 mL/min/{1.73_m2} (ref >=60)
Globulin: 2.7 g/dL (calc) (ref 1.9–3.7)
Glucose, Bld: 82 mg/dL (ref 65–99)
Potassium: 4.1 mmol/L (ref 3.5–5.3)
Sodium: 140 mmol/L (ref 135–146)
Total Bilirubin: 0.5 mg/dL (ref 0.2–1.2)
Total Protein: 7.6 g/dL (ref 6.1–8.1)

## 2018-08-27 LAB — TSH: TSH: 1.63 mIU/L (ref 0.40–4.50)

## 2018-08-27 LAB — HEMOGLOBIN A1C
Hgb A1c MFr Bld: 6.2 % of total Hgb — ABNORMAL HIGH (ref <5.7)
Mean Plasma Glucose: 131 (calc)
eAG (mmol/L): 7.3 (calc)

## 2018-08-27 LAB — VITAMIN B12: Vitamin B-12: 900 pg/mL (ref 200–1100)

## 2018-08-27 LAB — HIV ANTIBODY (ROUTINE TESTING W REFLEX): HIV 1&2 Ab, 4th Generation: NONREACTIVE

## 2018-08-27 LAB — VITAMIN D 25 HYDROXY (VIT D DEFICIENCY, FRACTURES): Vit D, 25-Hydroxy: 38 ng/mL (ref 30–100)

## 2018-08-27 LAB — RPR: RPR Ser Ql: NONREACTIVE

## 2018-08-28 LAB — CYTOLOGY - PAP
Diagnosis: NEGATIVE
HPV: NOT DETECTED

## 2018-09-20 DIAGNOSIS — J069 Acute upper respiratory infection, unspecified: Secondary | ICD-10-CM | POA: Diagnosis not present

## 2018-10-07 DIAGNOSIS — J069 Acute upper respiratory infection, unspecified: Secondary | ICD-10-CM | POA: Diagnosis not present

## 2018-10-09 ENCOUNTER — Ambulatory Visit: Payer: Medicare Other | Admitting: Gastroenterology

## 2018-10-09 ENCOUNTER — Encounter

## 2018-11-04 ENCOUNTER — Encounter: Payer: Self-pay | Admitting: Gastroenterology

## 2018-11-04 ENCOUNTER — Ambulatory Visit (INDEPENDENT_AMBULATORY_CARE_PROVIDER_SITE_OTHER): Payer: Medicare Other | Admitting: Gastroenterology

## 2018-11-04 ENCOUNTER — Other Ambulatory Visit: Payer: Self-pay

## 2018-11-04 ENCOUNTER — Telehealth: Payer: Self-pay | Admitting: Gastroenterology

## 2018-11-04 VITALS — BP 146/75 | HR 64 | Resp 16 | Ht 67.0 in | Wt 180.4 lb

## 2018-11-04 DIAGNOSIS — K5909 Other constipation: Secondary | ICD-10-CM | POA: Diagnosis not present

## 2018-11-04 DIAGNOSIS — K219 Gastro-esophageal reflux disease without esophagitis: Secondary | ICD-10-CM | POA: Diagnosis not present

## 2018-11-04 NOTE — Patient Instructions (Signed)
High-Fiber Diet  Fiber, also called dietary fiber, is a type of carbohydrate found in fruits, vegetables, whole grains, and beans. A high-fiber diet can have many health benefits. Your health care provider may recommend a high-fiber diet to help:  · Prevent constipation. Fiber can make your bowel movements more regular.  · Lower your cholesterol.  · Relieve hemorrhoids, uncomplicated diverticulosis, or irritable bowel syndrome.  · Prevent overeating as part of a weight-loss plan.  · Prevent heart disease, type 2 diabetes, and certain cancers.    What is my plan?  The recommended daily intake of fiber includes:  · 38 grams for men under age 50.  · 30 grams for men over age 50.  · 25 grams for women under age 50.  · 21 grams for women over age 50.    You can get the recommended daily intake of dietary fiber by eating a variety of fruits, vegetables, grains, and beans. Your health care provider may also recommend a fiber supplement if it is not possible to get enough fiber through your diet.  What do I need to know about a high-fiber diet?  · Fiber supplements have not been widely studied for their effectiveness, so it is better to get fiber through food sources.  · Always check the fiber content on the nutrition facts label of any prepackaged food. Look for foods that contain at least 5 grams of fiber per serving.  · Ask your dietitian if you have questions about specific foods that are related to your condition, especially if those foods are not listed in the following section.  · Increase your daily fiber consumption gradually. Increasing your intake of dietary fiber too quickly may cause bloating, cramping, or gas.  · Drink plenty of water. Water helps you to digest fiber.  What foods can I eat?  Grains  Whole-grain breads. Multigrain cereal. Oats and oatmeal. Brown rice. Barley. Bulgur wheat. Millet. Bran muffins. Popcorn. Rye wafer crackers.  Vegetables   Sweet potatoes. Spinach. Kale. Artichokes. Cabbage. Broccoli. Green peas. Carrots. Squash.  Fruits  Berries. Pears. Apples. Oranges. Avocados. Prunes and raisins. Dried figs.  Meats and Other Protein Sources  [place]y, kidney, pinto, and soy beans. Split peas. Lentils. Nuts and seeds.  Dairy  Fiber-fortified yogurt.  Beverages  Fiber-fortified soy milk. Fiber-fortified orange juice.  Other  Fiber bars.  The items listed above may not be a complete list of recommended foods or beverages. Contact your dietitian for more options.  What foods are not recommended?  Grains  White bread. Pasta made with refined flour. White rice.  Vegetables  Fried potatoes. Canned vegetables. Well-cooked vegetables.  Fruits  Fruit juice. Cooked, strained fruit.  Meats and Other Protein Sources  Fatty cuts of meat. Fried poultry or fried fish.  Dairy  Milk. Yogurt. Cream cheese. Sour cream.  Beverages  Soft drinks.  Other  Cakes and pastries. Butter and oils.  The items listed above may not be a complete list of foods and beverages to avoid. Contact your dietitian for more information.  What are some tips for including high-fiber foods in my diet?  · Eat a wide variety of high-fiber foods.  · Make sure that half of all grains consumed each day are whole grains.  · Replace breads and cereals made from refined flour or white flour with whole-grain breads and cereals.  · Replace white rice with brown rice, bulgur wheat, or millet.  · Start the day with a breakfast that is high in fiber,   such as a cereal that contains at least 5 grams of fiber per serving.  · Use beans in place of meat in soups, salads, or pasta.  · Eat high-fiber snacks, such as berries, raw vegetables, nuts, or popcorn.  This information is not intended to replace advice given to you by your health care provider. Make sure you discuss any questions you have with your health care provider.  Document Released: 12/10/2005 Document Revised: 05/17/2016 Document Reviewed: 05/25/2014   Elsevier Interactive Patient Education © 2018 [employer] Inc[employer]

## 2018-11-04 NOTE — Progress Notes (Signed)
Cephas Darby, MD [address]  [place]  [place], [place] [address]  Main: [phone]  Fax: [phone]    Gastroenterology Consultation  Referring Provider:     Steele Sizer, MD Primary Care Physician:  Steele Sizer, MD Primary Gastroenterologist:  Dr. Cephas Darby Reason for Consultation:     GERD, chronic abdominal pain and bloating        HPI:   Ashlee Richardson is a 62 y.o. female referred by Dr. Steele Sizer, MD  for consultation & management of chronic abdominal pain and bloating.  Patient has chronic back pain issues for which she has been taking Mobic and has not been able to tolerate gabapentin. She also reports Chronic constipation, abdominal pain which is more or less generalized to the mid abdomen associated with significant bloating.  She reports her stools are always hard associated with straining and generally every other day.  Her last bowel movement was yesterday which was hard.  She tries to incorporate more fiber in her diet only.  She has not tried any stool softeners or fiber supplements.  She had a CT in 2018 which was fairly unremarkable except for benign cavernous hemangioma in the liver which was stable in size. She also reports severe burning pain in her throat, sensation of food stuck in her chest associated with regurgitation.  She has been taking omeprazole 20 mg once a day which is not helping.  NSAIDs: Mobic for chronic back pain  Antiplts/Anticoagulants/Anti thrombotics: none  GI Procedures: colonoscopy 2018 by Dr Jamal Collin Diverticulosis, otherwise normal colon Colonoscopy in 09/2007  Past Medical History:  Diagnosis Date  . Diverticulitis   . Fibromyalgia   . GERD (gastroesophageal reflux disease)   . High cholesterol   . Migraine     Past Surgical History:  Procedure Laterality Date  . BACK SURGERY  1991?   Pt usure of date  . BREAST BIOPSY Right   . COLONOSCOPY  09/2007  . COLONOSCOPY WITH PROPOFOL N/A 08/28/2017     Procedure: COLONOSCOPY WITH PROPOFOL;  Surgeon: Christene Lye, MD;  Location: [facility] ENDOSCOPY;  Service: Endoscopy;  Laterality: N/A;  . Foot sugery Right    3rd toe from the right"    Current Outpatient Medications:  .  brompheniramine-pseudoephedrine-DM 30-2-10 MG/5ML syrup, TAKE 10 ML BY MOUTH THREE TIMES A DAY AS NEEDED, Disp: , Rfl: 0 .  fluticasone (FLONASE) 50 MCG/ACT nasal spray, SPRAY 1 SPRAY INTO EACH NOSTRIL EVERY DAY, Disp: , Rfl: 0 .  gabapentin (NEURONTIN) 300 MG capsule, Take 1 capsule (300 mg total) by mouth at bedtime., Disp: 90 capsule, Rfl: 0 .  meclizine (ANTIVERT) 25 MG tablet, Take 25 mg by mouth 3 (three) times daily as needed for dizziness., Disp: , Rfl:  .  meloxicam (MOBIC) 15 MG tablet, Take 15 mg by mouth daily., Disp: , Rfl:  .  omeprazole (PRILOSEC) 20 MG capsule, Take 1 capsule (20 mg total) by mouth daily., Disp: 60 capsule, Rfl: 2 .  rOPINIRole (REQUIP) 0.25 MG tablet, Take 1 tablet (0.25 mg total) by mouth at bedtime., Disp: 30 tablet, Rfl: 2 .  rosuvastatin (CRESTOR) 20 MG tablet, Take 1 tablet (20 mg total) by mouth daily., Disp: 90 tablet, Rfl: 1 .  azithromycin (ZITHROMAX) 250 MG tablet, TAKE 2 TABLETS BY MOUTH TODAY, THEN TAKE 1 TABLET DAILY FOR 4 DAYS, Disp: , Rfl: 0 .  polyethylene glycol (GOLYTELY/NULYTELY) 236 g solution, One bottle for colonoscopy prep. (Patient not taking: Reported on  11/04/2018), Disp: 4000 mL, Rfl: 0   Family History  Problem Relation Age of Onset  . Heart disease Mother   . Diabetes Mother   . Kidney disease Mother   . Hypertension Mother   . Heart disease Father   . Hyperlipidemia Father   . Hypertension Father   . Heart disease Unknown   . Cancer Unknown        Breast  . Diabetes Unknown   . Hyperlipidemia Sister   . Hypertension Sister   . Kidney disease Sister   . Diabetes Sister      Social History   Tobacco Use  . Smoking status: Never Smoker  . Smokeless tobacco: Never Used  . Tobacco comment:  smoking cessation materials not required  Substance Use Topics  . Alcohol use: No    Alcohol/week: 0.0 standard drinks  . Drug use: No    Allergies as of 11/04/2018 - Review Complete 11/04/2018  Allergen Reaction Noted  . Fenofibric acid  04/20/2015  . Lipitor [atorvastatin]  04/20/2015  . Nsaids  04/20/2015    Review of Systems:    All systems reviewed and negative except where noted in HPI.   Physical Exam:  BP (!) 146/75 (BP Location: Left Arm, Patient Position: Sitting, Cuff Size: Normal)   Pulse 64   Resp 16   Ht 5\' 7"  (1.702 m)   Wt 180 lb 6.4 oz (81.8 kg)   BMI 28.25 kg/m  No LMP recorded. Patient is postmenopausal.  General:   Alert,  Well-developed, well-nourished, pleasant and cooperative in NAD, patient is uncomfortable sitting due to low back pain Head:  Normocephalic and atraumatic. Eyes:  Sclera clear, no icterus.   Conjunctiva pink. Ears:  Normal auditory acuity. Nose:  No deformity, discharge, or lesions. Mouth:  No deformity or lesions,oropharynx pink & moist. Neck:  Supple; no masses or thyromegaly. Lungs:  Respirations even and unlabored.  Clear throughout to auscultation.   No wheezes, crackles, or rhonchi. No acute distress. Heart:  Regular rate and rhythm; no murmurs, clicks, rubs, or gallops. Abdomen:  Normal bowel sounds. Soft, diffuse tenderness in the midabdomen, moderately distended, tympanic to percussion without masses, hepatosplenomegaly or hernias noted.  No guarding or rebound tenderness.   Rectal: Not performed Msk:  Symmetrical without gross deformities. Good, equal movement & strength bilaterally. Pulses:  Normal pulses noted. Extremities:  No clubbing or edema.  No cyanosis. Neurologic:  Alert and oriented x3;  grossly normal neurologically. Skin:  Intact without significant lesions or rashes. No jaundice. Psych:  Alert and cooperative. Normal mood and affect.  Imaging Studies: Reviewed  Assessment and Plan:   Ashlee Richardson is  a 62 y.o. African-American female with obesity, chronic constipation seen in consultation for chronic abdominal pain associated with bloating, reflux symptoms  Abdominal pain: Secondary to severe constipation CT A/P in 2018 was unremarkable except for 6.8 cm cavernous hemangioma in the liver which was benign and stable in size compared to prior imaging. Discussed with her to manage severe constipation Incorporate high-fiber foods in her diet, information provided Fiber supplements and trial of stool softener Samples for Trulance provided  GERD: Increase omeprazole to 40 mg daily Schedule EGD   Follow up in 4 weeks   Cephas Darby, MD

## 2018-11-04 NOTE — Telephone Encounter (Signed)
Pt is calling she had an apt this morning and was told to call back to confirm availability for Tuesday for a procedure pt is available on tueday

## 2018-11-05 ENCOUNTER — Other Ambulatory Visit: Payer: Self-pay | Admitting: Family Medicine

## 2018-11-05 DIAGNOSIS — M2041 Other hammer toe(s) (acquired), right foot: Secondary | ICD-10-CM | POA: Diagnosis not present

## 2018-11-05 DIAGNOSIS — M797 Fibromyalgia: Secondary | ICD-10-CM

## 2018-11-05 DIAGNOSIS — B351 Tinea unguium: Secondary | ICD-10-CM | POA: Diagnosis not present

## 2018-11-05 DIAGNOSIS — M79674 Pain in right toe(s): Secondary | ICD-10-CM | POA: Diagnosis not present

## 2018-11-05 DIAGNOSIS — M79675 Pain in left toe(s): Secondary | ICD-10-CM | POA: Diagnosis not present

## 2018-11-05 DIAGNOSIS — G8929 Other chronic pain: Secondary | ICD-10-CM

## 2018-11-05 DIAGNOSIS — M5441 Lumbago with sciatica, right side: Principal | ICD-10-CM

## 2018-11-05 DIAGNOSIS — L851 Acquired keratosis [keratoderma] palmaris et plantaris: Secondary | ICD-10-CM | POA: Diagnosis not present

## 2018-11-10 NOTE — Telephone Encounter (Signed)
Patient calling stating someone was suppose to call her and schedule an EGD. Patient states she has not heard from anyone yet and would like to get that set up.

## 2018-11-11 ENCOUNTER — Other Ambulatory Visit: Payer: Self-pay

## 2018-11-11 DIAGNOSIS — K219 Gastro-esophageal reflux disease without esophagitis: Secondary | ICD-10-CM

## 2018-11-11 NOTE — Telephone Encounter (Signed)
Pt has been scheduled for 11/24/18, pt has been notified and verbalized understanding

## 2018-11-18 ENCOUNTER — Other Ambulatory Visit: Payer: Self-pay | Admitting: Family Medicine

## 2018-11-18 DIAGNOSIS — K279 Peptic ulcer, site unspecified, unspecified as acute or chronic, without hemorrhage or perforation: Secondary | ICD-10-CM

## 2018-11-24 ENCOUNTER — Other Ambulatory Visit: Payer: Self-pay

## 2018-11-24 ENCOUNTER — Encounter
Admission: RE | Disposition: A | Discharge status: Home / Self Care | Payer: Self-pay | Source: Ambulatory Visit | Attending: Gastroenterology

## 2018-11-24 ENCOUNTER — Ambulatory Visit
Admission: RE | Admit: 2018-11-24 | Discharge: 2018-11-24 | Disposition: A | Discharge status: Home / Self Care | Payer: Medicare Other | Source: Ambulatory Visit | Attending: Gastroenterology | Admitting: Gastroenterology

## 2018-11-24 ENCOUNTER — Encounter: Payer: Self-pay | Admitting: *Deleted

## 2018-11-24 ENCOUNTER — Ambulatory Visit: Payer: Medicare Other | Admitting: Anesthesiology

## 2018-11-24 DIAGNOSIS — E7800 Pure hypercholesterolemia, unspecified: Secondary | ICD-10-CM | POA: Insufficient documentation

## 2018-11-24 DIAGNOSIS — K219 Gastro-esophageal reflux disease without esophagitis: Secondary | ICD-10-CM | POA: Diagnosis present

## 2018-11-24 DIAGNOSIS — G43909 Migraine, unspecified, not intractable, without status migrainosus: Secondary | ICD-10-CM | POA: Diagnosis not present

## 2018-11-24 DIAGNOSIS — M1990 Unspecified osteoarthritis, unspecified site: Secondary | ICD-10-CM | POA: Insufficient documentation

## 2018-11-24 DIAGNOSIS — M797 Fibromyalgia: Secondary | ICD-10-CM | POA: Insufficient documentation

## 2018-11-24 DIAGNOSIS — K2950 Unspecified chronic gastritis without bleeding: Secondary | ICD-10-CM | POA: Insufficient documentation

## 2018-11-24 DIAGNOSIS — G35 Multiple sclerosis: Secondary | ICD-10-CM | POA: Diagnosis not present

## 2018-11-24 DIAGNOSIS — K449 Diaphragmatic hernia without obstruction or gangrene: Secondary | ICD-10-CM | POA: Insufficient documentation

## 2018-11-24 DIAGNOSIS — I340 Nonrheumatic mitral (valve) insufficiency: Secondary | ICD-10-CM | POA: Insufficient documentation

## 2018-11-24 DIAGNOSIS — Z7951 Long term (current) use of inhaled steroids: Secondary | ICD-10-CM | POA: Insufficient documentation

## 2018-11-24 DIAGNOSIS — K2960 Other gastritis without bleeding: Secondary | ICD-10-CM | POA: Diagnosis not present

## 2018-11-24 HISTORY — PX: ESOPHAGOGASTRODUODENOSCOPY (EGD) WITH PROPOFOL: SHX5813

## 2018-11-24 HISTORY — DX: Multiple sclerosis: G35

## 2018-11-24 SURGERY — ESOPHAGOGASTRODUODENOSCOPY (EGD) WITH PROPOFOL
Anesthesia: General

## 2018-11-24 MED ORDER — ESOMEPRAZOLE MAGNESIUM 40 MG PO PACK: 40.0000 mg | PACK | Freq: Every day | ORAL | 1 refills | Status: DC

## 2018-11-24 MED ORDER — PROPOFOL 500 MG/50ML IV EMUL
INTRAVENOUS | Status: DC | PRN
  Administered 2018-11-24: 160 ug/kg/min via INTRAVENOUS

## 2018-11-24 MED ORDER — SODIUM CHLORIDE 0.9 % IV SOLN
INTRAVENOUS | Status: DC
  Administered 2018-11-24: 10:00:00 via INTRAVENOUS

## 2018-11-24 MED ORDER — PROPOFOL 10 MG/ML IV BOLUS
INTRAVENOUS | Status: DC | PRN
  Administered 2018-11-24: 70 mg via INTRAVENOUS

## 2018-11-24 MED ORDER — LIDOCAINE HCL (CARDIAC) PF 100 MG/5ML IV SOSY
PREFILLED_SYRINGE | INTRAVENOUS | Status: DC | PRN
  Administered 2018-11-24: 100 mg via INTRAVENOUS

## 2018-11-24 NOTE — Op Note (Signed)
[facility] Gastroenterology Patient Name: Ashlee Richardson Procedure Date: 11/24/2018 10:37 AM MRN: [id number] Account #: [id number] Date of Birth: 06-08-1956 Admit Type: Outpatient Age: 62 Room: [facility] ENDO ROOM 2 Gender: Female Note Status: Finalized Procedure:            Upper GI endoscopy Indications:          Suspected esophageal reflux Providers:            Lin Landsman MD, MD Referring MD:         Bethena Roys. Sowles, MD (Referring MD) Medicines:            Monitored Anesthesia Care Complications:        No immediate complications. Estimated blood loss: None. Procedure:            Pre-Anesthesia Assessment:                       - Prior to the procedure, a History and Physical was                        performed, and patient medications and allergies were                        reviewed. The patient is competent. The risks and                        benefits of the procedure and the sedation options and                        risks were discussed with the patient. All questions                        were answered and informed consent was obtained.                        Patient identification and proposed procedure were                        verified by the physician, the nurse, the                        anesthesiologist, the anesthetist and the technician in                        the pre-procedure area in the procedure room in the                        endoscopy suite. Mental Status Examination: alert and                        oriented. Airway Examination: normal oropharyngeal                        airway and neck mobility. Respiratory Examination:                        clear to auscultation. CV Examination: normal.                        Prophylactic Antibiotics: The patient does not require  prophylactic antibiotics. Prior Anticoagulants: The                        patient has taken no previous anticoagulant or                antiplatelet agents. ASA Grade Assessment: II - A                        patient with mild systemic disease. After reviewing the                        risks and benefits, the patient was deemed in                        satisfactory condition to undergo the procedure. The                        anesthesia plan was to use monitored anesthesia care                        (MAC). Immediately prior to administration of                        medications, the patient was re-assessed for adequacy                        to receive sedatives. The heart rate, respiratory rate,                        oxygen saturations, blood pressure, adequacy of                        pulmonary ventilation, and response to care were                        monitored throughout the procedure. The physical status                        of the patient was re-assessed after the procedure.                       After obtaining informed consent, the endoscope was                        passed under direct vision. Throughout the procedure,                        the patient's blood pressure, pulse, and oxygen                        saturations were monitored continuously. The Endoscope                        was introduced through the mouth, and advanced to the                        third part of duodenum. The upper GI endoscopy was                        accomplished without difficulty. The  patient tolerated                        the procedure well. Findings:      The duodenal bulb, second portion of the duodenum and third portion of       the duodenum were normal.      A 1 cm hiatal hernia was present.      The stomach was normal. Biopsies were taken with a cold forceps for       Helicobacter pylori testing.      Esophagogastric landmarks were identified: the gastroesophageal junction       was found at 35 cm from the incisors.      The gastroesophageal junction and examined esophagus were normal.        Biopsies were taken with a cold forceps for histology. Impression:           - Normal duodenal bulb, second portion of the duodenum                        and third portion of the duodenum.                       - 1 cm hiatal hernia.                       - Normal stomach. Biopsied.                       - Esophagogastric landmarks identified.                       - Normal gastroesophageal junction and esophagus.                        Biopsied. Recommendation:       - Discharge patient to home (with escort).                       - Resume previous diet today.                       - Continue present medications.                       - Await pathology results.                       - Follow an antireflux regimen.                       - Use Prilosec (omeprazole) 40 mg PO BID.                       - Return to my office as previously scheduled. Dr. Ulyess Mort Lin Landsman MD, MD 11/24/2018 10:57:15 AM This report has been signed electronically. Number of Addenda: 0 Note Initiated On: 11/24/2018 10:37 AM      [facility]

## 2018-11-24 NOTE — Anesthesia Post-op Follow-up Note (Signed)
Anesthesia QCDR form completed.        

## 2018-11-24 NOTE — H&P (Signed)
Ashlee Darby, MD [address]  [place]  [place], [place] [address]  Main: [phone]  Fax: [phone] Pager: [phone]  Primary Care Physician:  Ashlee Sizer, MD Primary Gastroenterologist:  Dr. Cephas Richardson  Pre-Procedure History & Physical: HPI:  Ashlee Richardson is a 62 y.o. female is here for an endoscopy.   Past Medical History:  Diagnosis Date  . Diverticulitis   . Fibromyalgia   . GERD (gastroesophageal reflux disease)   . High cholesterol   . Migraine   . MS (multiple sclerosis) ([place])     Past Surgical History:  Procedure Laterality Date  . BACK SURGERY  1991?   Pt usure of date  . BREAST BIOPSY Right   . COLONOSCOPY  09/2007  . COLONOSCOPY WITH PROPOFOL N/A 08/28/2017   Procedure: COLONOSCOPY WITH PROPOFOL;  Surgeon: Ashlee Lye, MD;  Location: [facility] ENDOSCOPY;  Service: Endoscopy;  Laterality: N/A;  . Foot sugery Right    3rd toe from the right"    Prior to Admission medications   Medication Sig Start Date End Date Taking? Authorizing Provider  azithromycin (ZITHROMAX) 250 MG tablet TAKE 2 TABLETS BY MOUTH TODAY, THEN TAKE 1 TABLET DAILY FOR 4 DAYS 10/07/18   [provider]  brompheniramine-pseudoephedrine-DM 30-2-10 MG/5ML syrup TAKE 10 ML BY MOUTH THREE TIMES A DAY AS NEEDED 09/20/18   [provider]  fluticasone (FLONASE) 50 MCG/ACT nasal spray SPRAY 1 SPRAY INTO EACH NOSTRIL EVERY DAY 09/20/18   [provider]  gabapentin (NEURONTIN) 300 MG capsule Take 1 capsule (300 mg total) by mouth at bedtime. 08/26/18   Ashlee Sizer, MD  meclizine (ANTIVERT) 25 MG tablet Take 25 mg by mouth 3 (three) times daily as needed for dizziness.    [provider]  meloxicam (MOBIC) 15 MG tablet Take 15 mg by mouth daily.    [provider]  omeprazole (PRILOSEC) 20 MG capsule TAKE 1 CAPSULE BY MOUTH EVERY DAY 11/18/18   Ashlee Richardson, Ashlee Stager, MD  polyethylene glycol (GOLYTELY/NULYTELY) 236 g solution One  bottle for colonoscopy prep. Patient not taking: Reported on 11/04/2018 08/14/17   Ashlee Lye, MD  rOPINIRole (REQUIP) 0.25 MG tablet Take 1 tablet (0.25 mg total) by mouth at bedtime. 04/28/18   Ashlee Sizer, MD  rosuvastatin (CRESTOR) 20 MG tablet Take 1 tablet (20 mg total) by mouth daily. 08/26/18   Ashlee Sizer, MD    Allergies as of 11/11/2018 - Review Complete 11/04/2018  Allergen Reaction Noted  . Fenofibric acid  04/20/2015  . Lipitor [atorvastatin]  04/20/2015  . Nsaids  04/20/2015    Family History  Problem Relation Age of Onset  . Heart disease Mother   . Diabetes Mother   . Kidney disease Mother   . Hypertension Mother   . Heart disease Father   . Hyperlipidemia Father   . Hypertension Father   . Heart disease Unknown   . Cancer Unknown        Breast  . Diabetes Unknown   . Hyperlipidemia Sister   . Hypertension Sister   . Kidney disease Sister   . Diabetes Sister     Social History   Socioeconomic History  . Marital status: Single    Spouse name: Not on file  . Number of children: 2  . Years of education: Not on file  . Highest education level: Associate degree: academic program  Occupational History  . Occupation: Agricultural consultant: [employer]    Comment:  part time    Employer: DISABLED  Social Needs  . Financial resource strain: Hard  . Food insecurity:    Worry: Never true    Inability: Sometimes true  . Transportation needs:    Medical: No    Non-medical: No  Tobacco Use  . Smoking status: Never Smoker  . Smokeless tobacco: Never Used  . Tobacco comment: smoking cessation materials not required  Substance and Sexual Activity  . Alcohol use: No    Alcohol/week: 0.0 standard drinks  . Drug use: No  . Sexual activity: Yes    Birth control/protection: Post-menopausal  Lifestyle  . Physical activity:    Days per week: 0 days    Minutes per session: 0 min  . Stress: To some extent  Relationships  . Social  connections:    Talks on phone: More than three times a week    Gets together: More than three times a week    Attends religious service: More than 4 times per year    Active member of club or organization: Yes    Attends meetings of clubs or organizations: More than 4 times per year    Relationship status: Never married  . Intimate partner violence:    Fear of current or ex partner: No    Emotionally abused: No    Physically abused: No    Forced sexual activity: No  Other Topics Concern  . Not on file  Social History Narrative   Lives at home by herself.   Disable from chronic back pain since 1993   Diagnosed with MS in the 2000's    Review of Systems: See HPI, otherwise negative ROS  Physical Exam: BP (!) 150/69   Pulse 61   Temp 97.7 F (36.5 C) (Tympanic)   Resp 16   Ht 5\' 7"  (1.702 m)   Wt 81.6 kg   SpO2 100%   BMI 28.19 kg/m  General:   Alert,  pleasant and cooperative in NAD Head:  Normocephalic and atraumatic. Neck:  Supple; no masses or thyromegaly. Lungs:  Clear throughout to auscultation.    Heart:  Regular rate and rhythm. Abdomen:  Soft, nontender and nondistended. Normal bowel sounds, without guarding, and without rebound.   Neurologic:  Alert and  oriented x4;  grossly normal neurologically.  Impression/Plan: Ashlee Richardson is here for an endoscopy to be performed for GERD  Risks, benefits, limitations, and alternatives regarding  endoscopy have been reviewed with the patient.  Questions have been answered.  All parties agreeable.   Ashlee Sear, MD  11/24/2018, 10:33 AM

## 2018-11-24 NOTE — Transfer of Care (Signed)
Immediate Anesthesia Transfer of Care Note  Patient: Ashlee Richardson  Procedure(s) Performed: ESOPHAGOGASTRODUODENOSCOPY (EGD) WITH PROPOFOL (N/A )  Patient Location: PACU and Endoscopy Unit  Anesthesia Type:General  Level of Consciousness: drowsy  Airway & Oxygen Therapy: Patient Spontanous Breathing  Post-op Assessment: Report given to RN and Post -op Vital signs reviewed and stable  Post vital signs: Reviewed and stable  Last Vitals:  Vitals Value Taken Time  BP 130/65 11/24/2018 10:59 AM  Temp    Pulse 70 11/24/2018 10:59 AM  Resp 18 11/24/2018 10:59 AM  SpO2 96 % 11/24/2018 10:59 AM  Vitals shown include unvalidated device data.  Last Pain:  Vitals:   11/24/18 1010  TempSrc: Tympanic  PainSc: 0-No pain         Complications: No apparent anesthesia complications

## 2018-11-24 NOTE — Anesthesia Preprocedure Evaluation (Addendum)
Anesthesia Evaluation  Patient identified by MRN, date of birth, ID band Patient awake    Reviewed: Allergy & Precautions, H&P , NPO status , Patient's Chart, lab work & pertinent test results  Airway Mallampati: II       Dental  (+) Poor Dentition   Pulmonary neg pulmonary ROS, neg COPD,           Cardiovascular (-) Past MI and (-) Cardiac Stents (-) dysrhythmias   Echo 2016: NORMAL LEFT VENTRICULAR SYSTOLIC FUNCTION WITH AN ESTIMATED EF = 55-60 % NORMAL RIGHT VENTRICULAR SYSTOLIC FUNCTION TRIVIAL TRICUSPID AND MITRAL VALVE INSUFFICIENCY NO VALVULAR STENOSIS  NM stress 2016: Borderline abnormal myocardial perfusion scan small area of inferior apical septal defect border zone redistribution ejection fraction of 60%.Conclusion borderline myocardial perfusion scan recommend medical therapy for patient's symptoms are stable consider cardiac catheterization if symptoms worsen  No cath seen in chart   Neuro/Psych  Headaches,  Neuromuscular disease (muscular sclerosis) negative psych ROS   GI/Hepatic Neg liver ROS, PUD, GERD  ,  Endo/Other  negative endocrine ROS  Renal/GU negative Renal ROS  negative genitourinary   Musculoskeletal  (+) Arthritis , Fibromyalgia -  Abdominal   Peds  Hematology negative hematology ROS (+)   Anesthesia Other Findings Past Medical History: No date: Diverticulitis No date: Fibromyalgia No date: GERD (gastroesophageal reflux disease) No date: High cholesterol No date: Migraine No date: MS (multiple sclerosis) ([place])  Past Surgical History: 1991?: BACK SURGERY     Comment:  Pt usure of date No date: BREAST BIOPSY; Right 09/2007: COLONOSCOPY 08/28/2017: COLONOSCOPY WITH PROPOFOL; N/A     Comment:  Procedure: COLONOSCOPY WITH PROPOFOL;  Surgeon: Christene Lye, MD;  Location: [facility] ENDOSCOPY;  Service:               Endoscopy;  Laterality: N/A; No date: Foot  sugery; Right     Comment:  3rd toe from the right"  BMI    Body Mass Index:  28.19 kg/m      Reproductive/Obstetrics negative OB ROS                            Anesthesia Physical Anesthesia Plan  ASA: III  Anesthesia Plan: General   Post-op Pain Management:    Induction:   PONV Risk Score and Plan: Propofol infusion and TIVA  Airway Management Planned: Natural Airway and Nasal Cannula  Additional Equipment:   Intra-op Plan:   Post-operative Plan:   Informed Consent: I have reviewed the patients History and Physical, chart, labs and discussed the procedure including the risks, benefits and alternatives for the proposed anesthesia with the patient or authorized representative who has indicated his/her understanding and acceptance.   Dental Advisory Given  Plan Discussed with: Anesthesiologist  Anesthesia Plan Comments:         Anesthesia Quick Evaluation

## 2018-11-24 NOTE — Anesthesia Procedure Notes (Signed)
Performed by: Kaarin Pardy, CRNA Pre-anesthesia Checklist: Patient identified, Emergency Drugs available, Suction available, Patient being monitored and Timeout performed Patient Re-evaluated:Patient Re-evaluated prior to induction Oxygen Delivery Method: Nasal cannula Induction Type: IV induction       

## 2018-11-25 LAB — SURGICAL PATHOLOGY

## 2018-11-25 NOTE — Anesthesia Postprocedure Evaluation (Signed)
Anesthesia Post Note  Patient: Ashlee Richardson  Procedure(s) Performed: ESOPHAGOGASTRODUODENOSCOPY (EGD) WITH PROPOFOL (N/A )  Patient location during evaluation: PACU Anesthesia Type: General Level of consciousness: awake and alert Pain management: pain level controlled Vital Signs Assessment: post-procedure vital signs reviewed and stable Respiratory status: spontaneous breathing, nonlabored ventilation, respiratory function stable and patient connected to nasal cannula oxygen Cardiovascular status: blood pressure returned to baseline and stable Postop Assessment: no apparent nausea or vomiting Anesthetic complications: no     Last Vitals:  Vitals:   11/24/18 1137 11/24/18 1139  BP:  (!) 167/69  Pulse: 62 (!) 54  Resp: 15 10  Temp:    SpO2: 100% 100%    Last Pain:  Vitals:   11/24/18 1137  TempSrc:   PainSc: 0-No pain                 Durenda Hurt

## 2018-11-26 ENCOUNTER — Encounter: Payer: Self-pay | Admitting: Gastroenterology

## 2018-12-03 ENCOUNTER — Ambulatory Visit: Payer: Medicare Other | Admitting: Gastroenterology

## 2018-12-08 DIAGNOSIS — Z1231 Encounter for screening mammogram for malignant neoplasm of breast: Secondary | ICD-10-CM | POA: Diagnosis not present

## 2018-12-08 LAB — HM MAMMOGRAPHY: HM Mammogram: NORMAL (ref 0–4)

## 2019-01-02 ENCOUNTER — Ambulatory Visit: Payer: Medicare Other

## 2019-01-09 DIAGNOSIS — H18831 Recurrent erosion of cornea, right eye: Secondary | ICD-10-CM | POA: Diagnosis not present

## 2019-01-12 ENCOUNTER — Ambulatory Visit (INDEPENDENT_AMBULATORY_CARE_PROVIDER_SITE_OTHER): Payer: Medicare Other | Admitting: Gastroenterology

## 2019-01-12 ENCOUNTER — Other Ambulatory Visit: Payer: Self-pay

## 2019-01-12 ENCOUNTER — Encounter

## 2019-01-12 ENCOUNTER — Encounter: Payer: Self-pay | Admitting: Gastroenterology

## 2019-01-12 VITALS — BP 131/78 | HR 60 | Resp 16 | Ht 67.0 in | Wt 180.2 lb

## 2019-01-12 DIAGNOSIS — K219 Gastro-esophageal reflux disease without esophagitis: Secondary | ICD-10-CM | POA: Diagnosis not present

## 2019-01-12 DIAGNOSIS — K5909 Other constipation: Secondary | ICD-10-CM

## 2019-01-12 MED ORDER — DEXLANSOPRAZOLE 60 MG PO CPDR: 60.0000 mg | DELAYED_RELEASE_CAPSULE | Freq: Every day | ORAL | 0 refills | Status: DC

## 2019-01-12 NOTE — Progress Notes (Signed)
Cephas Darby, MD [address]  [place]  [place], [place] [address]  Main: [phone]  Fax: [phone]    Gastroenterology Consultation  Referring Provider:     Steele Sizer, MD Primary Care Physician:  Steele Sizer, MD Primary Gastroenterologist:  Dr. Cephas Darby Reason for Consultation:     GERD, chronic abdominal pain and bloating        HPI:   Ashlee Richardson is a 63 y.o. female referred by Dr. Steele Sizer, MD  for consultation & management of chronic abdominal pain and bloating.  Patient has chronic back pain issues for which she has been taking Mobic and has not been able to tolerate gabapentin. She also reports Chronic constipation, abdominal pain which is more or less generalized to the mid abdomen associated with significant bloating.  She reports her stools are always hard associated with straining and generally every other day.  Her last bowel movement was yesterday which was hard.  She tries to incorporate more fiber in her diet only.  She has not tried any stool softeners or fiber supplements.  She had a CT in 2018 which was fairly unremarkable except for benign cavernous hemangioma in the liver which was stable in size. She also reports severe burning pain in her throat, sensation of food stuck in her chest associated with regurgitation.  She has been taking omeprazole 20 mg once a day which is not helping.  Follow-up visit 01/12/2019 She underwent EGD which revealed small hiatal hernia.  Esophageal and gastric biopsies unremarkable.  She is currently taking omeprazole 40 mg twice daily with no relief.  She reports that her reflux symptoms have gotten worse since EGD and upset about it.  She reports regurgitation of the food associated with burning in her chest including at night.  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  She also reports that Trulance helped with constipation.  She ran out of samples but  did not call my office for a prescription.  She is currently constipated.  NSAIDs: Mobic for chronic back pain  Antiplts/Anticoagulants/Anti thrombotics: none  GI Procedures: colonoscopy 2018 by Dr Jamal Collin Diverticulosis, otherwise normal colon Colonoscopy in 09/2007  EGD 11/24/2018 - Normal duodenal bulb, second portion of the duodenum and third portion of the duodenum. - 1 cm hiatal hernia. - Normal stomach. Biopsied. - Esophagogastric landmarks identified. - Normal gastroesophageal junction and esophagus. Biopsied.  DIAGNOSIS:  A. STOMACH, RANDOM; COLD BIOPSY:  - FOCAL MILD NON-SPECIFIC CHRONIC GASTRITIS.  - NEGATIVE FOR H. PYLORI, DYSPLASIA, AND MALIGNANCY.   B. ESOPHAGUS; COLD BIOPSY:  - UNREMARKALBE SQUAMOUS MUCOSA.  - NEGATIVE FOR EOSINOPHILS, DYSPLASIA, AND MALIGNANCY.  - FRAGMENT OF UNREMARKABLE GASTRIC MUCOSA.    Past Medical History:  Diagnosis Date  . Diverticulitis   . Fibromyalgia   . GERD (gastroesophageal reflux disease)   . High cholesterol   . Migraine   . MS (multiple sclerosis) ([place])     Past Surgical History:  Procedure Laterality Date  . BACK SURGERY  1991?   Pt usure of date  . BREAST BIOPSY Right   . COLONOSCOPY  09/2007  . COLONOSCOPY WITH PROPOFOL N/A 08/28/2017   Procedure: COLONOSCOPY WITH PROPOFOL;  Surgeon: Christene Lye, MD;  Location: [facility] ENDOSCOPY;  Service: Endoscopy;  Laterality: N/A;  . ESOPHAGOGASTRODUODENOSCOPY (EGD) WITH PROPOFOL N/A 11/24/2018   Procedure: ESOPHAGOGASTRODUODENOSCOPY (EGD) WITH PROPOFOL;  Surgeon: Lin Landsman, MD;  Location: [place];  Service: Gastroenterology;  Laterality: N/A;  . Foot sugery Right    3rd toe from the right"    Current Outpatient Medications:  .  DULoxetine (CYMBALTA) 60 MG capsule, Take 60 mg by mouth daily., Disp: , Rfl:  .  erythromycin ophthalmic ointment, , Disp: , Rfl:  .  fluticasone (FLONASE) 50 MCG/ACT nasal spray, SPRAY 1 SPRAY INTO EACH NOSTRIL EVERY DAY,  Disp: , Rfl: 0 .  meclizine (ANTIVERT) 25 MG tablet, Take 25 mg by mouth 3 (three) times daily as needed for dizziness., Disp: , Rfl:  .  meloxicam (MOBIC) 15 MG tablet, Take 15 mg by mouth daily., Disp: , Rfl:  .  omeprazole (PRILOSEC) 20 MG capsule, Take by mouth., Disp: , Rfl:  .  rOPINIRole (REQUIP) 0.25 MG tablet, Take 1 tablet (0.25 mg total) by mouth at bedtime., Disp: 30 tablet, Rfl: 2 .  rosuvastatin (CRESTOR) 20 MG tablet, Take 1 tablet (20 mg total) by mouth daily., Disp: 90 tablet, Rfl: 1 .  brompheniramine-pseudoephedrine-DM 30-2-10 MG/5ML syrup, TAKE 10 ML BY MOUTH THREE TIMES A DAY AS NEEDED, Disp: , Rfl: 0 .  dexlansoprazole (DEXILANT) 60 MG capsule, Take 1 capsule (60 mg total) by mouth daily., Disp: 90 capsule, Rfl: 0 .  esomeprazole (NEXIUM) 40 MG packet, Take 40 mg by mouth daily before breakfast. (Patient not taking: Reported on 01/12/2019), Disp: 30 each, Rfl: 1 .  gabapentin (NEURONTIN) 300 MG capsule, Take 1 capsule (300 mg total) by mouth at bedtime. (Patient not taking: Reported on 01/12/2019), Disp: 90 capsule, Rfl: 0   Family History  Problem Relation Age of Onset  . Heart disease Mother   . Diabetes Mother   . Kidney disease Mother   . Hypertension Mother   . Heart disease Father   . Hyperlipidemia Father   . Hypertension Father   . Heart disease Unknown   . Cancer Unknown        Breast  . Diabetes Unknown   . Hyperlipidemia Sister   . Hypertension Sister   . Kidney disease Sister   . Diabetes Sister      Social History   Tobacco Use  . Smoking status: Never Smoker  . Smokeless tobacco: Never Used  . Tobacco comment: smoking cessation materials not required  Substance Use Topics  . Alcohol use: No    Alcohol/week: 0.0 standard drinks  . Drug use: No    Allergies as of 01/12/2019 - Review Complete 01/12/2019  Allergen Reaction Noted  . Fenofibric acid  04/20/2015  . Lipitor [atorvastatin]  04/20/2015  . Nsaids  04/20/2015    Review of  Systems:    All systems reviewed and negative except where noted in HPI.   Physical Exam:  BP 131/78 (BP Location: Left Arm, Patient Position: Sitting, Cuff Size: Normal)   Pulse 60   Resp 16   Ht 5\' 7"  (1.702 m)   Wt 180 lb 3.2 oz (81.7 kg)   BMI 28.22 kg/m  No LMP recorded. Patient is postmenopausal.  General:   Alert,  Well-developed, well-nourished, pleasant and cooperative in NAD, patient is uncomfortable sitting due to low back pain Head:  Normocephalic and atraumatic. Eyes:  Sclera clear, no icterus.   Conjunctiva pink. Ears:  Normal auditory acuity. Nose:  No deformity, discharge, or lesions. Mouth:  No deformity or lesions,oropharynx pink & moist. Neck:  Supple; no masses or thyromegaly. Lungs:  Respirations even and unlabored.  Clear throughout to auscultation.   No wheezes, crackles, or rhonchi. No  acute distress. Heart:  Regular rate and rhythm; no murmurs, clicks, rubs, or gallops. Abdomen:  Normal bowel sounds. Soft, diffuse tenderness in the midabdomen, moderately distended, tympanic to percussion without masses, hepatosplenomegaly or hernias noted.  No guarding or rebound tenderness.   Rectal: Not performed Msk:  Symmetrical without gross deformities. Good, equal movement & strength bilaterally. Pulses:  Normal pulses noted. Extremities:  No clubbing or edema.  No cyanosis. Neurologic:  Alert and oriented x3;  grossly normal neurologically. Skin:  Intact without significant lesions or rashes. No jaundice. Psych:  Alert and cooperative. Normal mood and affect.  Imaging Studies: Reviewed  Assessment and Plan:   MAXI CARRERAS is a 63 y.o. African-American female with obesity, chronic constipation seen in consultation for chronic abdominal pain associated with bloating, reflux symptoms  Abdominal pain: Secondary to severe constipation CT A/P in 2018 was unremarkable except for 6.8 cm cavernous hemangioma in the liver which was benign and stable in size compared  to prior imaging. Discussed with her to manage severe constipation Incorporate high-fiber foods in her diet, information provided Fiber supplements and trial of stool softener Samples for Trulance samples, worked but she is not willing to have co-pay. With medicare/medicaid, I will try amitiza 45mcg daily, samples provided  GERD: EGD normal Increased omeprazole to 40 mg daily, no relief Switch to Dexilant 60 mg daily If symptoms are persistent, will obtain pH impedance study and refer for anti-reflux surgery   Follow up in 4 weeks   Cephas Darby, MD

## 2019-02-19 ENCOUNTER — Other Ambulatory Visit: Payer: Self-pay | Admitting: Family Medicine

## 2019-02-19 DIAGNOSIS — E785 Hyperlipidemia, unspecified: Secondary | ICD-10-CM

## 2019-02-19 NOTE — Telephone Encounter (Signed)
She needs follow up, not seen since 07/2018 and had multiple medical problems. Did I code her for CPE or just office visit?

## 2019-02-19 NOTE — Telephone Encounter (Signed)
Refill Request for Cholesterol medication. Crestor to [facility]  Last visit 08/26/2018   Lab Results  Component Value Date   CHOL 217 (H) 04/28/2018   HDL 74 04/28/2018   LDLCALC 121 (H) 04/28/2018   TRIG 114 04/28/2018   CHOLHDL 2.9 04/28/2018   Follow up on 03/25/2019

## 2019-02-19 NOTE — Telephone Encounter (Signed)
LVM for pt to call the office to schedule an appt for medication refill

## 2019-02-19 NOTE — Telephone Encounter (Signed)
Patient stated she does not want to refill the Dyslipidemia medication.

## 2019-03-12 DIAGNOSIS — H04123 Dry eye syndrome of bilateral lacrimal glands: Secondary | ICD-10-CM | POA: Diagnosis not present

## 2019-03-25 ENCOUNTER — Ambulatory Visit: Payer: Medicare Other | Admitting: Family Medicine

## 2019-04-06 ENCOUNTER — Other Ambulatory Visit: Payer: Self-pay | Admitting: Gastroenterology

## 2019-06-25 ENCOUNTER — Telehealth: Payer: Self-pay | Admitting: Orthopaedic Surgery

## 2019-06-25 NOTE — Telephone Encounter (Signed)
Patient came in to office and signed release for records to be obtained from [facility] and [place] for Dr Lorin Mercy

## 2019-07-08 ENCOUNTER — Other Ambulatory Visit: Payer: Self-pay

## 2019-07-08 ENCOUNTER — Ambulatory Visit: Payer: Medicare Other | Admitting: Family Medicine

## 2019-07-08 ENCOUNTER — Encounter: Payer: Self-pay | Admitting: Family Medicine

## 2019-07-08 ENCOUNTER — Ambulatory Visit (INDEPENDENT_AMBULATORY_CARE_PROVIDER_SITE_OTHER): Payer: Medicare Other | Admitting: Family Medicine

## 2019-07-08 VITALS — BP 128/68 | HR 78 | Temp 96.9°F | Resp 16 | Ht 67.0 in | Wt 178.2 lb

## 2019-07-08 DIAGNOSIS — E538 Deficiency of other specified B group vitamins: Secondary | ICD-10-CM | POA: Diagnosis not present

## 2019-07-08 DIAGNOSIS — I209 Angina pectoris, unspecified: Secondary | ICD-10-CM

## 2019-07-08 DIAGNOSIS — R739 Hyperglycemia, unspecified: Secondary | ICD-10-CM

## 2019-07-08 DIAGNOSIS — E559 Vitamin D deficiency, unspecified: Secondary | ICD-10-CM | POA: Diagnosis not present

## 2019-07-08 DIAGNOSIS — R030 Elevated blood-pressure reading, without diagnosis of hypertension: Secondary | ICD-10-CM

## 2019-07-08 DIAGNOSIS — G35 Multiple sclerosis: Secondary | ICD-10-CM

## 2019-07-08 DIAGNOSIS — M797 Fibromyalgia: Secondary | ICD-10-CM

## 2019-07-08 DIAGNOSIS — G8929 Other chronic pain: Secondary | ICD-10-CM

## 2019-07-08 DIAGNOSIS — G2581 Restless legs syndrome: Secondary | ICD-10-CM

## 2019-07-08 DIAGNOSIS — M659 Synovitis and tenosynovitis, unspecified: Secondary | ICD-10-CM

## 2019-07-08 DIAGNOSIS — K2930 Chronic superficial gastritis without bleeding: Secondary | ICD-10-CM

## 2019-07-08 DIAGNOSIS — E785 Hyperlipidemia, unspecified: Secondary | ICD-10-CM

## 2019-07-08 DIAGNOSIS — M5441 Lumbago with sciatica, right side: Secondary | ICD-10-CM

## 2019-07-08 DIAGNOSIS — M7711 Lateral epicondylitis, right elbow: Secondary | ICD-10-CM

## 2019-07-08 LAB — POCT GLYCOSYLATED HEMOGLOBIN (HGB A1C): HbA1c, POC (controlled diabetic range): 6.2 % (ref 0.0–7.0)

## 2019-07-08 MED ORDER — ROPINIROLE HCL 0.25 MG PO TABS: 0.2500 mg | ORAL_TABLET | Freq: Every day | ORAL | 5 refills | Status: DC

## 2019-07-08 MED ORDER — MELOXICAM 15 MG PO TABS: 15.0000 mg | ORAL_TABLET | Freq: Every day | ORAL | 0 refills | Status: DC

## 2019-07-08 MED ORDER — ROSUVASTATIN CALCIUM 20 MG PO TABS: 20.0000 mg | ORAL_TABLET | ORAL | 0 refills | Status: DC

## 2019-07-08 NOTE — Progress Notes (Signed)
Name: Ashlee Richardson   MRN: [id number]    DOB: 07-05-1956   Date:07/08/2019       Progress Note  Subjective  Chief Complaint  Chief Complaint  Patient presents with  . Thumb Pain    Onset-2 months, stuck fingers all on right hand  . Elbow Pain    Right Elbow and has swelled up before  . Follow-up    Gabapentin makes her feel like her bones ache and has headaches    HPI  MS: under the care of Dr. Manuella Ghazi , had a positive IG for herpes on spinal tap but was given reassurance by Dr. Ola Spurr. She denies weakness, but has intermittent tingling an numbness on both arms. Stable and not on medication. MRI showed white plaques. She has some tremors and sometimes decrease in memory, on duloxetine but stopped gabapentin   Chronic back pain and neck pain, also has FMS: used to see Dr. Primus Bravo, currently trying to go to [facility] to see Dr. Lorin Mercy for back pain. She is compliant with Duloxetine but stopped gabapentin because it was making her have dizziness and headache and also joint aches.   GERD and gastritis: seen by Dr. Marius Ditch, weight is up 5 lbs since last visit with me, taking Dexilant and symptoms are controlled   B12 deficiency and Vitamin D deficiency: on oral supplementation for vitamin D and B12 and last levels at [facility]  RLS: taking Requip but not every night, does not like taking medications, she does not sleep well at times because of her legs, advised to try taking medication every night  Hyperglycemia: A1C was elevated, advised to change her diet a little, avoid sweets, sweet beverages and starches.   Angina Pectoris: seen by Dr. Clayborn Bigness on statin therapy , bp elevated today, no history of DM, but has positive family history of diabetes. She still has episodes of chest pain, described as sharp on left side and intermittent throughout the day when it happens. Associated with mild SOB  FMS: she has aches and pains all day, 9/10 on duloxetine, could not tolerate lyrica or  gabapentin, she has been active in her yard  Right elbow pain: working in her yard more often lately, she has noticed pain during touch and also with movement, likely tendinitis, advised brace, topical nsaid's and return if not better   Right Dequervain's tenosynovitis: pain on right thumb, no swelling but very uncomfortable with flexion and palpation   Patient Active Problem List   Diagnosis Date Noted  . GERD (gastroesophageal reflux disease) 04/28/2018  . Pure hypercholesterolemia 04/28/2018  . Diverticulosis 04/28/2018  . RLS (restless legs syndrome) 04/28/2018  . Angina pectoris ([place]) 04/28/2018  . Vitamin D deficiency 12/27/2017  . Chronic fatigue, unspecified 12/27/2017  . Intractable migraine without aura and without status migrainosus 10/17/2015  . DDD (degenerative disc disease), cervical 05/23/2015  . Bilateral occipital neuralgia 05/23/2015  . DDD (degenerative disc disease), lumbar 05/23/2015  . Multiple sclerosis ([place]) 05/23/2015  . Cervical disc disorder with radiculopathy of cervical region 04/20/2015  . Right arm numbness 04/20/2015  . Right arm weakness 04/20/2015    Past Surgical History:  Procedure Laterality Date  . BACK SURGERY  1991?   Pt usure of date  . BREAST BIOPSY Right   . COLONOSCOPY  09/2007  . COLONOSCOPY WITH PROPOFOL N/A 08/28/2017   Procedure: COLONOSCOPY WITH PROPOFOL;  Surgeon: Christene Lye, MD;  Location: [facility] ENDOSCOPY;  Service: Endoscopy;  Laterality: N/A;  . ESOPHAGOGASTRODUODENOSCOPY (EGD) WITH PROPOFOL  N/A 11/24/2018   Procedure: ESOPHAGOGASTRODUODENOSCOPY (EGD) WITH PROPOFOL;  Surgeon: Lin Landsman, MD;  Location: [facility] ENDOSCOPY;  Service: Gastroenterology;  Laterality: N/A;  . Foot sugery Right    3rd toe from the right"    Family History  Problem Relation Age of Onset  . Heart disease Mother   . Diabetes Mother   . Kidney disease Mother   . Hypertension Mother   . Heart disease Father   . Hyperlipidemia Father    . Hypertension Father   . Heart disease Unknown   . Cancer Unknown        Breast  . Diabetes Unknown   . Hyperlipidemia Sister   . Hypertension Sister   . Kidney disease Sister   . Diabetes Sister     Social History   Socioeconomic History  . Marital status: Single    Spouse name: Not on file  . Number of children: 2  . Years of education: Not on file  . Highest education level: Associate degree: academic program  Occupational History  . Occupation: Agricultural consultant: [employer]    Comment: part time    Employer: [employer]  . Financial resource strain: Hard  . Food insecurity    Worry: Never true    Inability: Sometimes true  . Transportation needs    Medical: No    Non-medical: No  Tobacco Use  . Smoking status: Never Smoker  . Smokeless tobacco: Never Used  . Tobacco comment: smoking cessation materials not required  Substance and Sexual Activity  . Alcohol use: No    Alcohol/week: 0.0 standard drinks  . Drug use: No  . Sexual activity: Yes    Birth control/protection: Post-menopausal  Lifestyle  . Physical activity    Days per week: 0 days    Minutes per session: 0 min  . Stress: To some extent  Relationships  . Social connections    Talks on phone: More than three times a week    Gets together: More than three times a week    Attends religious service: More than 4 times per year    Active member of club or organization: Yes    Attends meetings of clubs or organizations: More than 4 times per year    Relationship status: Never married  . Intimate partner violence    Fear of current or ex partner: No    Emotionally abused: No    Physically abused: No    Forced sexual activity: No  Other Topics Concern  . Not on file  Social History Narrative   Lives at home by herself.   Disable from chronic back pain since 1993   Diagnosed with MS in the 2000's     Current Outpatient Medications:  .  DEXILANT 60 MG capsule, TAKE 1  CAPSULE BY MOUTH EVERY DAY, Disp: 90 capsule, Rfl: 0 .  DULoxetine (CYMBALTA) 60 MG capsule, Take 60 mg by mouth daily., Disp: , Rfl:  .  erythromycin ophthalmic ointment, , Disp: , Rfl:  .  fluticasone (FLONASE) 50 MCG/ACT nasal spray, SPRAY 1 SPRAY INTO EACH NOSTRIL EVERY DAY, Disp: , Rfl: 0 .  meclizine (ANTIVERT) 25 MG tablet, Take 25 mg by mouth 3 (three) times daily as needed for dizziness., Disp: , Rfl:  .  meloxicam (MOBIC) 15 MG tablet, Take 1 tablet (15 mg total) by mouth daily., Disp: 90 tablet, Rfl: 0 .  MURO 128 5 % ophthalmic solution, INSTILL 1 DROP  IN RIGHT EYE THREE TIMES PER DAY, Disp: , Rfl:  .  rOPINIRole (REQUIP) 0.25 MG tablet, Take 1 tablet (0.25 mg total) by mouth at bedtime., Disp: 30 tablet, Rfl: 5 .  rosuvastatin (CRESTOR) 20 MG tablet, Take 1 tablet (20 mg total) by mouth every other day., Disp: 45 tablet, Rfl: 0  Allergies  Allergen Reactions  . Fenofibric Acid   . Lipitor [Atorvastatin]   . Nsaids     I personally reviewed active problem list, medication list, allergies, family history, social history with the patient/caregiver today.   ROS  Constitutional: Negative for fever or weight change.  Respiratory: Negative for cough and shortness of breath.   Cardiovascular: Negative for chest pain or palpitations.  Gastrointestinal: Negative for abdominal pain, no bowel changes.  Musculoskeletal: Negative for gait problem or joint swelling.  Skin: Negative for rash.  Neurological: Negative for dizziness or headache.  No other specific complaints in a complete review of systems (except as listed in HPI above).  Objective  Vitals:   07/08/19 0901 07/08/19 0941  BP: (!) 142/62 128/68  Pulse: 78   Resp: 16   Temp: (!) 96.9 F (36.1 C)   TempSrc: Temporal   SpO2: 99%   Weight: 178 lb 3.2 oz (80.8 kg)   Height: 5\' 7"  (1.702 m)     Body mass index is 27.91 kg/m.  Physical Exam  Constitutional: Patient appears well-developed and well-nourished.  Overweight.  No distress.  HEENT: head atraumatic, normocephalic, pupils equal and reactive to light, neck supple Cardiovascular: Normal rate, regular rhythm and normal heart sounds.  No murmur heard. No BLE edema. Pulmonary/Chest: Effort normal and breath sounds normal. No respiratory distress. Abdominal: Soft.  There is no tenderness. Psychiatric: Patient has a normal mood and affect. behavior is normal. Judgment and thought content normal.   PHQ2/9: Depression screen [facility] 2/9 07/08/2019 08/26/2018 08/07/2018 04/28/2018 04/28/2018  Decreased Interest 0 0 1 0 0  Down, Depressed, Hopeless 0 0 1 0 0  PHQ - 2 Score 0 0 2 0 0  Altered sleeping 3 3 1  - -  Tired, decreased energy 1 3 1  - -  Change in appetite 0 3 1 - -  Feeling bad or failure about yourself  0 0 0 - -  Trouble concentrating 0 0 1 - -  Moving slowly or fidgety/restless 0 0 1 - -  Suicidal thoughts 0 0 0 - -  PHQ-9 Score 4 9 7  - -  Difficult doing work/chores Not difficult at all Somewhat difficult Somewhat difficult - -    phq 9 is negative   Fall Risk: Fall Risk  07/08/2019 08/26/2018 08/07/2018 04/28/2018 04/28/2018  Falls in the past year? 0 Yes Yes No No  Number falls in past yr: 0 1 1 - -  Injury with Fall? 0 Yes Yes - -  Comment - - concussion - -  Risk Factor Category  - High Fall Risk High Fall Risk - -  Risk for fall due to : - History of fall(s);Impaired balance/gait;Impaired vision Impaired vision;Impaired balance/gait;Medication side effect - -  Risk for fall due to: Comment - - wears eyeglasses; MS; vertigo - -  Follow up - Falls evaluation completed Falls evaluation completed;Education provided;Falls prevention discussed - -    Functional Status Survey: Is the patient deaf or have difficulty hearing?: No Does the patient have difficulty seeing, even when wearing glasses/contacts?: Yes Does the patient have difficulty concentrating, remembering, or making decisions?: No Does the patient have  difficulty walking or  climbing stairs?: No Does the patient have difficulty dressing or bathing?: No Does the patient have difficulty doing errands alone such as visiting a doctor's office or shopping?: No    Assessment & Plan  1. Hyperglycemia  - POCT glycosylated hemoglobin (Hb A1C)  2. B12 deficiency  Continue supplementation   3. Vitamin D deficiency  Continues supplementation   4. Dyslipidemia  - rosuvastatin (CRESTOR) 20 MG tablet; Take 1 tablet (20 mg total) by mouth every other day.  Dispense: 45 tablet; Refill: 0  5. Chronic bilateral low back pain with right-sided sciatica  On duloxetine , stopped gabapentin   6. Angina pectoris ([place])  - Ambulatory referral to Cardiology  7. Fibromyalgia  On duloxetine  8. Multiple sclerosis ([facility])  Stable, sees Dr. Manuella Ghazi   9. Chronic superficial gastritis without bleeding  Under the care of Dr. Marius Ditch on [place]   10. Elevated BP without diagnosis of hypertension  Monitor for now, she states she was rushing to get here   11. RLS (restless legs syndrome)  - rOPINIRole (REQUIP) 0.25 MG tablet; Take 1 tablet (0.25 mg total) by mouth at bedtime.  Dispense: 30 tablet; Refill: 5  12. Tenosynovitis of thumb  Take Meloxicam  13. Lateral epicondylitis of right elbow

## 2019-07-10 ENCOUNTER — Other Ambulatory Visit: Payer: Self-pay | Admitting: Gastroenterology

## 2019-07-11 ENCOUNTER — Other Ambulatory Visit: Payer: Self-pay | Admitting: Family Medicine

## 2019-07-11 DIAGNOSIS — E785 Hyperlipidemia, unspecified: Secondary | ICD-10-CM

## 2019-07-13 DIAGNOSIS — K219 Gastro-esophageal reflux disease without esophagitis: Secondary | ICD-10-CM | POA: Diagnosis not present

## 2019-07-13 DIAGNOSIS — G43019 Migraine without aura, intractable, without status migrainosus: Secondary | ICD-10-CM | POA: Diagnosis not present

## 2019-07-13 DIAGNOSIS — I208 Other forms of angina pectoris: Secondary | ICD-10-CM | POA: Diagnosis not present

## 2019-07-13 DIAGNOSIS — E7800 Pure hypercholesterolemia, unspecified: Secondary | ICD-10-CM | POA: Diagnosis not present

## 2019-07-13 DIAGNOSIS — R0602 Shortness of breath: Secondary | ICD-10-CM | POA: Diagnosis not present

## 2019-07-13 NOTE — Telephone Encounter (Signed)
lvm for pt to call and schedule an appt °

## 2019-07-23 DIAGNOSIS — I208 Other forms of angina pectoris: Secondary | ICD-10-CM | POA: Diagnosis not present

## 2019-07-23 DIAGNOSIS — R0602 Shortness of breath: Secondary | ICD-10-CM | POA: Diagnosis not present

## 2019-07-30 DIAGNOSIS — R9082 White matter disease, unspecified: Secondary | ICD-10-CM | POA: Diagnosis not present

## 2019-07-30 DIAGNOSIS — G43019 Migraine without aura, intractable, without status migrainosus: Secondary | ICD-10-CM | POA: Diagnosis not present

## 2019-07-30 DIAGNOSIS — R5382 Chronic fatigue, unspecified: Secondary | ICD-10-CM | POA: Diagnosis not present

## 2019-07-30 DIAGNOSIS — R413 Other amnesia: Secondary | ICD-10-CM | POA: Diagnosis not present

## 2019-07-30 DIAGNOSIS — G2581 Restless legs syndrome: Secondary | ICD-10-CM | POA: Diagnosis not present

## 2019-08-03 ENCOUNTER — Other Ambulatory Visit: Payer: Self-pay

## 2019-08-05 ENCOUNTER — Other Ambulatory Visit: Payer: Self-pay | Admitting: Neurology

## 2019-08-05 DIAGNOSIS — R9082 White matter disease, unspecified: Secondary | ICD-10-CM

## 2019-08-08 ENCOUNTER — Ambulatory Visit
Admission: RE | Admit: 2019-08-08 | Discharge: 2019-08-08 | Disposition: A | Discharge status: Home / Self Care | Payer: Medicare Other | Source: Ambulatory Visit | Attending: Neurology | Admitting: Neurology

## 2019-08-08 ENCOUNTER — Other Ambulatory Visit: Payer: Self-pay

## 2019-08-08 DIAGNOSIS — R9082 White matter disease, unspecified: Secondary | ICD-10-CM | POA: Insufficient documentation

## 2019-08-08 DIAGNOSIS — R51 Headache: Secondary | ICD-10-CM | POA: Diagnosis not present

## 2019-08-08 LAB — POCT I-STAT CREATININE: Creatinine, Ser: 0.8 mg/dL (ref 0.44–1.00)

## 2019-08-08 MED ORDER — GADOBUTROL 1 MMOL/ML IV SOLN
8.0000 mL | Freq: Once | INTRAVENOUS | Status: AC | PRN
  Administered 2019-08-08: 8 mL via INTRAVENOUS

## 2019-08-11 ENCOUNTER — Ambulatory Visit (INDEPENDENT_AMBULATORY_CARE_PROVIDER_SITE_OTHER): Payer: Medicare Other

## 2019-08-11 VITALS — Ht 67.0 in | Wt 178.0 lb

## 2019-08-11 DIAGNOSIS — Z0000 Encounter for general adult medical examination without abnormal findings: Secondary | ICD-10-CM | POA: Diagnosis not present

## 2019-08-11 NOTE — Progress Notes (Signed)
Subjective:   Ashlee Richardson is a 63 y.o. female who presents for Medicare Annual (Subsequent) preventive examination.  Virtual Visit via Telephone Note  I connected with Ashlee Richardson on 08/11/19 at  2:10 PM EDT by telephone and verified that I am speaking with the correct person using two identifiers.  Medicare Annual Wellness visit completed telephonically due to Covid-19 pandemic.   Location: Patient: home Provider: office   I discussed the limitations, risks, security and privacy concerns of performing an evaluation and management service by telephone and the availability of in person appointments. The patient expressed understanding and agreed to proceed.  Some vital signs may be absent or patient reported.   Ashlee Marker, LPN    Review of Systems:   Cardiac Risk Factors include: dyslipidemia     Objective:     Vitals: Ht 5\' 7"  (1.702 m)    Wt 178 lb (80.7 kg)    BMI 27.88 kg/m   Body mass index is 27.88 kg/m.  Advanced Directives 08/11/2019 11/24/2018 08/07/2018 08/28/2017 07/30/2017 01/13/2017  Does Patient Have a Medical Advance Directive? No No No No No No  Would patient like information on creating a medical advance directive? No - Patient declined No - Patient declined Yes (MAU/Ambulatory/Procedural Areas - Information given) No - Patient declined No - Patient declined -    Tobacco Social History   Tobacco Use  Smoking Status Never Smoker  Smokeless Tobacco Never Used  Tobacco Comment   smoking cessation materials not required     Counseling given: Not Answered Comment: smoking cessation materials not required   Clinical Intake:  Pre-visit preparation completed: Yes  Pain : 0-10 Pain Score: 8  Pain Type: Chronic pain(awaiting appt with pain management) Pain Location: Back(neck) Pain Orientation: Lower Pain Descriptors / Indicators: Sharp, Aching, Discomfort Pain Onset: More than a month ago Pain Frequency: Constant     BMI - recorded:  27.88 Nutritional Status: BMI 25 -29 Overweight Nutritional Risks: None Diabetes: No  How often do you need to have someone help you when you read instructions, pamphlets, or other written materials from your doctor or pharmacy?: 1 - Never  Interpreter Needed?: No  Information entered by :: Ashlee Marker LPN  Past Medical History:  Diagnosis Date   B12 deficiency 12/27/2017   Diverticulitis    Fibromyalgia    GERD (gastroesophageal reflux disease)    High cholesterol    Migraine    MS (multiple sclerosis) ([place])    PUD (peptic ulcer disease) 04/28/2018   Past Surgical History:  Procedure Laterality Date   BACK SURGERY  1991?   Pt usure of date   BREAST BIOPSY Right    COLONOSCOPY  09/2007   COLONOSCOPY WITH PROPOFOL N/A 08/28/2017   Procedure: COLONOSCOPY WITH PROPOFOL;  Surgeon: Christene Lye, MD;  Location: [facility] ENDOSCOPY;  Service: Endoscopy;  Laterality: N/A;   ESOPHAGOGASTRODUODENOSCOPY (EGD) WITH PROPOFOL N/A 11/24/2018   Procedure: ESOPHAGOGASTRODUODENOSCOPY (EGD) WITH PROPOFOL;  Surgeon: Lin Landsman, MD;  Location: [facility] ENDOSCOPY;  Service: Gastroenterology;  Laterality: N/A;   Foot sugery Right    3rd toe from the right"   Family History  Problem Relation Age of Onset   Heart disease Mother    Diabetes Mother    Kidney disease Mother    Hypertension Mother    Heart disease Father    Hyperlipidemia Father    Hypertension Father    Heart disease Other    Cancer Other  Breast   Diabetes Other    Hyperlipidemia Sister    Hypertension Sister    Kidney disease Sister    Diabetes Sister    Social History   Socioeconomic History   Marital status: Single    Spouse name: Not on file   Number of children: 2   Years of education: Not on file   Highest education level: Associate degree: academic program  Occupational History   Occupation: Agricultural consultant: [employer]    Comment: part time     Employer: DISABLED  Social Designer, fashion/clothing strain: Somewhat hard   Food insecurity    Worry: Never true    Inability: Never true   Transportation needs    Medical: No    Non-medical: No  Tobacco Use   Smoking status: Never Smoker   Smokeless tobacco: Never Used   Tobacco comment: smoking cessation materials not required  Substance and Sexual Activity   Alcohol use: No    Alcohol/week: 0.0 standard drinks   Drug use: No   Sexual activity: Yes    Birth control/protection: Post-menopausal  Lifestyle   Physical activity    Days per week: 3 days    Minutes per session: 60 min   Stress: To some extent  Relationships   Social connections    Talks on phone: More than three times a week    Gets together: More than three times a week    Attends religious service: More than 4 times per year    Active member of club or organization: Yes    Attends meetings of clubs or organizations: More than 4 times per year    Relationship status: Never married  Other Topics Concern   Not on file  Social History Narrative   Lives at home by herself.   Disable from chronic back pain since 1993   Diagnosed with MS in the 2000's    Outpatient Encounter Medications as of 08/11/2019  Medication Sig   Calcium-Magnesium-Vitamin D 300-150-400 MG-MG-UNIT TABS Take by mouth.   cholecalciferol (VITAMIN D3) 25 MCG (1000 UT) tablet Take 1,000 Units by mouth daily.   DEXILANT 60 MG capsule TAKE 1 CAPSULE BY MOUTH EVERY DAY   donepezil (ARICEPT) 5 MG tablet Take 1 tablet by mouth daily.   DULoxetine (CYMBALTA) 60 MG capsule Take 60 mg by mouth daily.   erythromycin ophthalmic ointment    ferrous sulfate 325 (65 FE) MG EC tablet Take 325 mg by mouth daily.   fluticasone (FLONASE) 50 MCG/ACT nasal spray SPRAY 1 SPRAY INTO EACH NOSTRIL EVERY DAY   magnesium oxide (MAG-OX) 400 MG tablet Take 400 mg by mouth daily.   meclizine (ANTIVERT) 25 MG tablet Take 25 mg by mouth 3 (three)  times daily as needed for dizziness.   meloxicam (MOBIC) 15 MG tablet Take 1 tablet (15 mg total) by mouth daily.   MURO 128 5 % ophthalmic solution INSTILL 1 DROP IN RIGHT EYE THREE TIMES PER DAY   rOPINIRole (REQUIP) 0.25 MG tablet Take 1 tablet (0.25 mg total) by mouth at bedtime.   rosuvastatin (CRESTOR) 20 MG tablet TAKE 1 TABLET BY MOUTH EVERY DAY   SUMAtriptan (IMITREX) 100 MG tablet Take as directed PRN migraines   vitamin B-12 (CYANOCOBALAMIN) 1000 MCG tablet Take 1,000 mcg by mouth daily.   No facility-administered encounter medications on file as of 08/11/2019.     Activities of Daily Living In your present state of health, do you  have any difficulty performing the following activities: 08/11/2019 07/08/2019  Hearing? Y N  Comment hearing clinic information provided -  Vision? N Y  Comment - -  Difficulty concentrating or making decisions? Y N  Comment sees neurology -  Walking or climbing stairs? N N  Comment - -  Dressing or bathing? N N  Doing errands, shopping? N N  Preparing Food and eating ? N -  Using the Toilet? N -  In the past six months, have you accidently leaked urine? Y -  Comment wears pads for protection. -  Do you have problems with loss of bowel control? N -  Managing your Medications? N -  Managing your Finances? N -  Housekeeping or managing your Housekeeping? N -  Some recent data might be hidden    Patient Care Team: Steele Sizer, MD as PCP - General (Family Medicine) Christene Lye, MD as Consulting Physician (General Surgery) Vladimir Crofts, MD as Consulting Physician (Neurology) Yolonda Kida, MD as Consulting Physician (Cardiology) Elvina Mattes, Adele Schilder as Consulting Physician (Podiatry)    Assessment:   This is a routine wellness examination for [place].  Exercise Activities and Dietary recommendations Current Exercise Habits: Home exercise routine, Type of exercise: treadmill;walking, Time (Minutes): 60, Frequency  (Times/Week): 3, Weekly Exercise (Minutes/Week): 180, Exercise limited by: neurologic condition(s)  Goals     DIET - INCREASE WATER INTAKE     Recommend to drink at least 6-8 8oz glasses of water per day.       Fall Risk Fall Risk  08/11/2019 07/08/2019 08/26/2018 08/07/2018 04/28/2018  Falls in the past year? 0 0 Yes Yes No  Number falls in past yr: 0 0 1 1 -  Injury with Fall? 0 0 Yes Yes -  Comment - - - concussion -  Risk Factor Category  - - High Fall Risk High Fall Risk -  Risk for fall due to : Impaired balance/gait - History of fall(s);Impaired balance/gait;Impaired vision Impaired vision;Impaired balance/gait;Medication side effect -  Risk for fall due to: Comment - - - wears eyeglasses; MS; vertigo -  Follow up Falls prevention discussed - Falls evaluation completed Falls evaluation completed;Education provided;Falls prevention discussed -   FALL RISK PREVENTION PERTAINING TO THE HOME:  Any stairs in or around the home? Yes  If so, do they handrails? No  steps outside only   Home free of loose throw rugs in walkways, pet beds, electrical cords, etc? Yes  Adequate lighting in your home to reduce risk of falls? Yes   ASSISTIVE DEVICES UTILIZED TO PREVENT FALLS:  Life alert? No  Use of a cane, walker or w/c? No  Grab bars in the bathroom? No  Shower chair or bench in shower? No  Elevated toilet seat or a handicapped toilet? No   DME ORDERS:  DME order needed?  No   TIMED UP AND GO:  Was the test performed? No . Telephonic visit.   Education: Fall risk prevention has been discussed.  Intervention(s) required? Yes  - handrails need to be installed, patient rents home and has notified homeowner.   Depression Screen PHQ 2/9 Scores 08/11/2019 07/08/2019 08/26/2018 08/07/2018  PHQ - 2 Score 0 0 0 2  PHQ- 9 Score - 4 9 7      Cognitive Function pt declined 6CIT, memory function test given by Dr. Manuella Ghazi with neurology.      6CIT Screen 08/07/2018  What Year? 0 points  What  month? 0 points  What time?  0 points  Count back from 20 0 points  Months in reverse 2 points  Repeat phrase 4 points  Total Score 6    Immunization History  Administered Date(s) Administered   Td 12/25/2015    Qualifies for Shingles Vaccine? Yes  . Due for Shingrix. Education has been provided regarding the importance of this vaccine. Pt has been advised to call insurance company to determine out of pocket expense. Advised may also receive vaccine at local pharmacy or Health Dept. Verbalized acceptance and understanding.  Tdap: Up to date  Flu Vaccine: Due for Flu vaccine. Does the patient want to receive this vaccine today?  No . Education has been provided regarding the importance of this vaccine but still declined. Advised may receive this vaccine at local pharmacy or Health Dept. Aware to provide a copy of the vaccination record if obtained from local pharmacy or Health Dept. Verbalized acceptance and understanding.  Pneumococcal Vaccine: Due for Pneumococcal vaccine. Does the patient want to receive this vaccine today?  No . Education has been provided regarding the importance of this vaccine but still declined. Advised may receive this vaccine at local pharmacy or Health Dept. Aware to provide a copy of the vaccination record if obtained from local pharmacy or Health Dept. Verbalized acceptance and understanding.   Screening Tests Health Maintenance  Topic Date Due   MAMMOGRAM  06/12/2018   INFLUENZA VACCINE  07/25/2019   PAP SMEAR-Modifier  08/26/2021   TETANUS/TDAP  12/24/2025   COLONOSCOPY  08/29/2027   Hepatitis C Screening  Completed   HIV Screening  Completed    Cancer Screenings:  Colorectal Screening: Completed 08/28/17. Repeat every 10 years.  Mammogram: Completed 12/08/18. Repeat every year.   Bone Density: Not completed. Due at age 11.   Lung Cancer Screening: (Low Dose CT Chest recommended if Age 47-80 years, 30 pack-year currently smoking OR have  quit w/in 15years.) does not qualify.   Additional Screening:  Hepatitis C Screening: does qualify; Completed 09/06/17.  Vision Screening: Recommended annual ophthalmology exams for early detection of glaucoma and other disorders of the eye. Is the patient up to date with their annual eye exam?  Yes  Who is the provider or what is the name of the office in which the pt attends annual eye exams? [place] Screening: Recommended annual dental exams for proper oral hygiene  Community Resource Referral:  CRR required this visit?  No       Plan:     I have personally reviewed and addressed the Medicare Annual Wellness questionnaire and have noted the following in the patients chart:  A. Medical and social history B. Use of alcohol, tobacco or illicit drugs  C. Current medications and supplements D. Functional ability and status E.  Nutritional status F.  Physical activity G. Advance directives H. List of other physicians I.  Hospitalizations, surgeries, and ER visits in previous 12 months J.  [place] such as hearing and vision if needed, cognitive and depression L. Referrals and appointments   In addition, I have reviewed and discussed with patient certain preventive protocols, quality metrics, and best practice recommendations. A written personalized care plan for preventive services as well as general preventive health recommendations were provided to patient.   Signed,  Ashlee Marker, LPN Nurse Health Advisor   Nurse Notes: pt c/o neck and lower back pain, awaiting appt with pain management. She also c/o urinary urgency/incontinence starting over the last week, wearing pads for protection.  She denies UTI sxs other than mild odor to urine. Advised pt to contact office for appt prior to scheduled visit at the end of September if sxs worsen or persist.

## 2019-08-11 NOTE — Patient Instructions (Signed)
Ashlee Richardson , Thank you for taking time to come for your Medicare Wellness Visit. I appreciate your ongoing commitment to your health goals. Please review the following plan we discussed and let me know if I can assist you in the future.   Screening recommendations/referrals: Colonoscopy: done 08/28/17. Repeat in 2028. Mammogram: done 12/08/18 Bone Density: due age 63 Recommended yearly ophthalmology/optometry visit for glaucoma screening and checkup Recommended yearly dental visit for hygiene and checkup  Vaccinations: Influenza vaccine: postponed Pneumococcal vaccine: postponed Tdap vaccine: done 2017 Shingles vaccine: Shingrix discussed. Please contact your pharmacy for coverage information.   Advanced directives: Advance directive discussed with you today. Even though you declined this today please call our office should you change your mind and we can give you the proper paperwork for you to fill out.  Conditions/risks identified:  Free hearing clinics offered in [place]:   [address] [place], [place], [place] [address] [phone]  [facility], [place], [place] [address] [phone]  Next appointment: Please follow up in one year for your Medicare Annual Wellness visit.    Preventive Care 40-64 Years, Female Preventive care refers to lifestyle choices and visits with your health care provider that can promote health and wellness. What does preventive care include?  A yearly physical exam. This is also called an annual well check.  Dental exams once or twice a year.  Routine eye exams. Ask your health care provider how often you should have your eyes checked.  Personal lifestyle choices, including:  Daily care of your teeth and gums.  Regular physical activity.  Eating a healthy diet.  Avoiding tobacco and drug use.  Limiting alcohol use.  Practicing safe sex.  Taking low-dose aspirin daily  starting at age 27.  Taking vitamin and mineral supplements as recommended by your health care provider. What happens during an annual well check? The services and screenings done by your health care provider during your annual well check will depend on your age, overall health, lifestyle risk factors, and family history of disease. Counseling  Your health care provider may ask you questions about your:  Alcohol use.  Tobacco use.  Drug use.  Emotional well-being.  Home and relationship well-being.  Sexual activity.  Eating habits.  Work and work Statistician.  Method of birth control.  Menstrual cycle.  Pregnancy history. Screening  You may have the following tests or measurements:  Height, weight, and BMI.  Blood pressure.  Lipid and cholesterol levels. These may be checked every 5 years, or more frequently if you are over 58 years old.  Skin check.  Lung cancer screening. You may have this screening every year starting at age 6 if you have a 30-pack-year history of smoking and currently smoke or have quit within the past 15 years.  Fecal occult blood test (FOBT) of the stool. You may have this test every year starting at age 11.  Flexible sigmoidoscopy or colonoscopy. You may have a sigmoidoscopy every 5 years or a colonoscopy every 10 years starting at age 59.  Hepatitis C blood test.  Hepatitis B blood test.  Sexually transmitted disease (STD) testing.  Diabetes screening. This is done by checking your blood sugar (glucose) after you have not eaten for a while (fasting). You may have this done every 1-3 years.  Mammogram. This may be done every 1-2 years. Talk to your health care provider about when you should start having regular mammograms. This  may depend on whether you have a family history of breast cancer.  BRCA-related cancer screening. This may be done if you have a family history of breast, ovarian, tubal, or peritoneal cancers.  Pelvic exam and  Pap test. This may be done every 3 years starting at age 3. Starting at age 37, this may be done every 5 years if you have a Pap test in combination with an HPV test.  Bone density scan. This is done to screen for osteoporosis. You may have this scan if you are at high risk for osteoporosis. Discuss your test results, treatment options, and if necessary, the need for more tests with your health care provider. Vaccines  Your health care provider may recommend certain vaccines, such as:  Influenza vaccine. This is recommended every year.  Tetanus, diphtheria, and acellular pertussis (Tdap, Td) vaccine. You may need a Td booster every 10 years.  Zoster vaccine. You may need this after age 65.  Pneumococcal 13-valent conjugate (PCV13) vaccine. You may need this if you have certain conditions and were not previously vaccinated.  Pneumococcal polysaccharide (PPSV23) vaccine. You may need one or two doses if you smoke cigarettes or if you have certain conditions. Talk to your health care provider about which screenings and vaccines you need and how often you need them. This information is not intended to replace advice given to you by your health care provider. Make sure you discuss any questions you have with your health care provider. Document Released: 01/06/2016 Document Revised: 08/29/2016 Document Reviewed: 10/11/2015 Elsevier Interactive Patient Education  2017 [place] Prevention in the Home Falls can cause injuries. They can happen to people of all ages. There are many things you can do to make your home safe and to help prevent falls. What can I do on the outside of my home?  Regularly fix the edges of walkways and driveways and fix any cracks.  Remove anything that might make you trip as you walk through a door, such as a raised step or threshold.  Trim any bushes or trees on the path to your home.  Use bright outdoor lighting.  Clear any walking paths of  anything that might make someone trip, such as rocks or tools.  Regularly check to see if handrails are loose or broken. Make sure that both sides of any steps have handrails.  Any raised decks and porches should have guardrails on the edges.  Have any leaves, snow, or ice cleared regularly.  Use sand or salt on walking paths during winter.  Clean up any spills in your garage right away. This includes oil or grease spills. What can I do in the bathroom?  Use night lights.  Install grab bars by the toilet and in the tub and shower. Do not use towel bars as grab bars.  Use non-skid mats or decals in the tub or shower.  If you need to sit down in the shower, use a plastic, non-slip stool.  Keep the floor dry. Clean up any water that spills on the floor as soon as it happens.  Remove soap buildup in the tub or shower regularly.  Attach bath mats securely with double-sided non-slip rug tape.  Do not have throw rugs and other things on the floor that can make you trip. What can I do in the bedroom?  Use night lights.  Make sure that you have a light by your bed that is easy to reach.  Do not  use any sheets or blankets that are too big for your bed. They should not hang down onto the floor.  Have a firm chair that has side arms. You can use this for support while you get dressed.  Do not have throw rugs and other things on the floor that can make you trip. What can I do in the kitchen?  Clean up any spills right away.  Avoid walking on wet floors.  Keep items that you use a lot in easy-to-reach places.  If you need to reach something above you, use a strong step stool that has a grab bar.  Keep electrical cords out of the way.  Do not use floor polish or wax that makes floors slippery. If you must use wax, use non-skid floor wax.  Do not have throw rugs and other things on the floor that can make you trip. What can I do with my stairs?  Do not leave any items on the  stairs.  Make sure that there are handrails on both sides of the stairs and use them. Fix handrails that are broken or loose. Make sure that handrails are as long as the stairways.  Check any carpeting to make sure that it is firmly attached to the stairs. Fix any carpet that is loose or worn.  Avoid having throw rugs at the top or bottom of the stairs. If you do have throw rugs, attach them to the floor with carpet tape.  Make sure that you have a light switch at the top of the stairs and the bottom of the stairs. If you do not have them, ask someone to add them for you. What else can I do to help prevent falls?  Wear shoes that:  Do not have high heels.  Have rubber bottoms.  Are comfortable and fit you well.  Are closed at the toe. Do not wear sandals.  If you use a stepladder:  Make sure that it is fully opened. Do not climb a closed stepladder.  Make sure that both sides of the stepladder are locked into place.  Ask someone to hold it for you, if possible.  Clearly mark and make sure that you can see:  Any grab bars or handrails.  First and last steps.  Where the edge of each step is.  Use tools that help you move around (mobility aids) if they are needed. These include:  Canes.  Walkers.  Scooters.  Crutches.  Turn on the lights when you go into a dark area. Replace any light bulbs as soon as they burn out.  Set up your furniture so you have a clear path. Avoid moving your furniture around.  If any of your floors are uneven, fix them.  If there are any pets around you, be aware of where they are.  Review your medicines with your doctor. Some medicines can make you feel dizzy. This can increase your chance of falling. Ask your doctor what other things that you can do to help prevent falls. This information is not intended to replace advice given to you by your health care provider. Make sure you discuss any questions you have with your health care  provider. Document Released: 10/06/2009 Document Revised: 05/17/2016 Document Reviewed: 01/14/2015 Elsevier Interactive Patient Education  2017 [employer].

## 2019-08-13 ENCOUNTER — Ambulatory Visit: Payer: Medicare Other

## 2019-08-13 ENCOUNTER — Other Ambulatory Visit: Payer: Self-pay

## 2019-08-13 ENCOUNTER — Ambulatory Visit (INDEPENDENT_AMBULATORY_CARE_PROVIDER_SITE_OTHER): Payer: Medicare Other | Admitting: Family Medicine

## 2019-08-13 ENCOUNTER — Encounter: Payer: Self-pay | Admitting: Family Medicine

## 2019-08-13 VITALS — BP 128/72 | HR 65 | Temp 96.5°F | Resp 16 | Ht 68.0 in | Wt 178.5 lb

## 2019-08-13 DIAGNOSIS — G8929 Other chronic pain: Secondary | ICD-10-CM

## 2019-08-13 DIAGNOSIS — M5441 Lumbago with sciatica, right side: Secondary | ICD-10-CM | POA: Diagnosis not present

## 2019-08-13 DIAGNOSIS — M542 Cervicalgia: Secondary | ICD-10-CM | POA: Diagnosis not present

## 2019-08-13 DIAGNOSIS — M797 Fibromyalgia: Secondary | ICD-10-CM | POA: Diagnosis not present

## 2019-08-13 MED ORDER — PREGABALIN 50 MG PO CAPS: 50.0000 mg | ORAL_CAPSULE | Freq: Three times a day (TID) | ORAL | 0 refills | Status: DC

## 2019-08-13 MED ORDER — TRAMADOL HCL 50 MG PO TABS: 50.0000 mg | ORAL_TABLET | Freq: Three times a day (TID) | ORAL | 0 refills | Status: AC | PRN

## 2019-08-13 MED ORDER — TIZANIDINE HCL 2 MG PO TABS: 2.0000 mg | ORAL_TABLET | Freq: Three times a day (TID) | ORAL | 0 refills | Status: DC

## 2019-08-13 NOTE — Progress Notes (Signed)
Name: Ashlee Richardson   MRN: [id number]    DOB: January 13, 1956   Date:08/13/2019       Progress Note  Subjective  Chief Complaint  Chief Complaint  Patient presents with  . Neck Pain    Onset-years-woke up and the pain is severe in her left shoulder and radiates to the back of her neck-denies any trauma    HPI  Chronic back pain and neck pain, also has FMS: used to see Dr. Primus Bravo, currently trying to go to [facility] to see Dr. Lorin Mercy for back pain but they are waiting to see her records from [facility] before she can be seen by him. . She is compliant with Duloxetine but stopped gabapentin because it was making her have dizziness and headache and also joint aches. Today she states pain is much worse on her neck , radiating from left shoulder up to nuchal area and has to leave work, pain level is 8/10. She denies tingling numbness or weakness on her hand. She is on duloxetine and meloxicam. Discussed adding Lyrica and she agrees, we will also add a muscle relaxer and give her tramadol for acute pain    Patient Active Problem List   Diagnosis Date Noted  . GERD (gastroesophageal reflux disease) 04/28/2018  . Pure hypercholesterolemia 04/28/2018  . Diverticulosis 04/28/2018  . RLS (restless legs syndrome) 04/28/2018  . Angina pectoris ([place]) 04/28/2018  . Vitamin D deficiency 12/27/2017  . Chronic fatigue, unspecified 12/27/2017  . Intractable migraine without aura and without status migrainosus 10/17/2015  . DDD (degenerative disc disease), cervical 05/23/2015  . Bilateral occipital neuralgia 05/23/2015  . DDD (degenerative disc disease), lumbar 05/23/2015  . Multiple sclerosis ([place]) 05/23/2015  . Cervical disc disorder with radiculopathy of cervical region 04/20/2015  . Right arm numbness 04/20/2015  . Right arm weakness 04/20/2015    Past Surgical History:  Procedure Laterality Date  . BACK SURGERY  1991?   Pt usure of date  . BREAST BIOPSY Right   . COLONOSCOPY  09/2007  . COLONOSCOPY  WITH PROPOFOL N/A 08/28/2017   Procedure: COLONOSCOPY WITH PROPOFOL;  Surgeon: Christene Lye, MD;  Location: [facility] ENDOSCOPY;  Service: Endoscopy;  Laterality: N/A;  . ESOPHAGOGASTRODUODENOSCOPY (EGD) WITH PROPOFOL N/A 11/24/2018   Procedure: ESOPHAGOGASTRODUODENOSCOPY (EGD) WITH PROPOFOL;  Surgeon: Lin Landsman, MD;  Location: [facility] ENDOSCOPY;  Service: Gastroenterology;  Laterality: N/A;  . Foot sugery Right    3rd toe from the right"    Family History  Problem Relation Age of Onset  . Heart disease Mother   . Diabetes Mother   . Kidney disease Mother   . Hypertension Mother   . Heart disease Father   . Hyperlipidemia Father   . Hypertension Father   . Heart disease Other   . Cancer Other        Breast  . Diabetes Other   . Hyperlipidemia Sister   . Hypertension Sister   . Kidney disease Sister   . Diabetes Sister     Social History   Socioeconomic History  . Marital status: Single    Spouse name: Not on file  . Number of children: 2  . Years of education: Not on file  . Highest education level: Associate degree: academic program  Occupational History  . 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 Financial resource strain: Somewhat hard  . Food insecurity  Worry: Never true    Inability: Never true  . Transportation needs    Medical: No    Non-medical: No  Tobacco Use  . Smoking status: Never Smoker  . Smokeless tobacco: Never Used  . Tobacco comment: smoking cessation materials not required  Substance and Sexual Activity  . Alcohol use: No    Alcohol/week: 0.0 standard drinks  . Drug use: No  . Sexual activity: Yes    Birth control/protection: Post-menopausal  Lifestyle  . Physical activity    Days per week: 3 days    Minutes per session: 60 min  . Stress: To some extent  Relationships  . Social connections    Talks on phone: More than three times a week    Gets  together: More than three times a week    Attends religious service: More than 4 times per year    Active member of club or organization: Yes    Attends meetings of clubs or organizations: More than 4 times per year    Relationship status: Never married  . Intimate partner violence    Fear of current or ex partner: No    Emotionally abused: No    Physically abused: No    Forced sexual activity: No  Other Topics Concern  . Not on file  Social History Narrative   Lives at home by herself.   Disable from chronic back pain since 1993   Diagnosed with MS in the 2000's     Current Outpatient Medications:  .  Calcium-Magnesium-Vitamin D 300-150-400 MG-MG-UNIT TABS, Take by mouth., Disp: , Rfl:  .  cholecalciferol (VITAMIN D3) 25 MCG (1000 UT) tablet, Take 1,000 Units by mouth daily., Disp: , Rfl:  .  DEXILANT 60 MG capsule, TAKE 1 CAPSULE BY MOUTH EVERY DAY, Disp: 90 capsule, Rfl: 0 .  donepezil (ARICEPT) 5 MG tablet, Take 1 tablet by mouth daily., Disp: , Rfl:  .  DULoxetine (CYMBALTA) 60 MG capsule, Take 60 mg by mouth daily., Disp: , Rfl:  .  erythromycin ophthalmic ointment, , Disp: , Rfl:  .  ferrous sulfate 325 (65 FE) MG EC tablet, Take 325 mg by mouth daily., Disp: , Rfl:  .  fluticasone (FLONASE) 50 MCG/ACT nasal spray, SPRAY 1 SPRAY INTO EACH NOSTRIL EVERY DAY, Disp: , Rfl: 0 .  magnesium oxide (MAG-OX) 400 MG tablet, Take 400 mg by mouth daily., Disp: , Rfl:  .  meclizine (ANTIVERT) 25 MG tablet, Take 25 mg by mouth 3 (three) times daily as needed for dizziness., Disp: , Rfl:  .  meloxicam (MOBIC) 15 MG tablet, Take 1 tablet (15 mg total) by mouth daily., Disp: 90 tablet, Rfl: 0 .  MURO 128 5 % ophthalmic solution, INSTILL 1 DROP IN RIGHT EYE THREE TIMES PER DAY, Disp: , Rfl:  .  rOPINIRole (REQUIP) 0.25 MG tablet, Take 1 tablet (0.25 mg total) by mouth at bedtime., Disp: 30 tablet, Rfl: 5 .  rosuvastatin (CRESTOR) 20 MG tablet, TAKE 1 TABLET BY MOUTH EVERY DAY, Disp: 90 tablet,  Rfl: 0 .  SUMAtriptan (IMITREX) 100 MG tablet, Take as directed PRN migraines, Disp: , Rfl:  .  vitamin B-12 (CYANOCOBALAMIN) 1000 MCG tablet, Take 1,000 mcg by mouth daily., Disp: , Rfl:   Allergies  Allergen Reactions  . Fenofibric Acid   . Lipitor [Atorvastatin]   . Nsaids     I personally reviewed active problem list, medication list, allergies, family history, social history with the patient/caregiver today.   ROS  Ten systems reviewed and is negative except as mentioned in HPI   Objective  Vitals:   08/13/19 1206  BP: 128/72  Pulse: 65  Resp: 16  Temp: (!) 96.5 F (35.8 C)  TempSrc: Temporal  SpO2: 99%  Weight: 178 lb 8 oz (81 kg)  Height: 5\' 8"  (1.727 m)    Body mass index is 27.14 kg/m.  Physical Exam  Constitutional: Patient appears well-developed overweight. In mild distress secondary to pain  HEENT: head atraumatic, normocephalic, pupils equal and reactive to light Cardiovascular: Normal rate, regular rhythm and normal heart sounds.  No murmur heard. No BLE edema. Pulmonary/Chest: Effort normal and breath sounds normal. No respiratory distress. Abdominal: Soft.  There is no tenderness. Muscular Skeletal: normal grip, normal sensation on both extremities, trapezium muscle tight, decrease rom of neck Psychiatric: Patient has a normal mood and affect. behavior is normal. Judgment and thought content normal.  Recent Results (from the past 2160 hour(s))  POCT glycosylated hemoglobin (Hb A1C)     Status: Normal   Collection Time: 07/08/19  9:49 AM  Result Value Ref Range   Hemoglobin A1C     HbA1c POC (<> result, manual entry)     HbA1c, POC (prediabetic range)     HbA1c, POC (controlled diabetic range) 6.2 0.0 - 7.0 %  I-STAT creatinine     Status: None   Collection Time: 08/08/19 11:34 AM  Result Value Ref Range   Creatinine, Ser 0.80 0.44 - 1.00 mg/dL      PHQ2/9: Depression screen [facility] 2/9 08/13/2019 08/11/2019 07/08/2019 08/26/2018 08/07/2018   Decreased Interest 0 0 0 0 1  Down, Depressed, Hopeless 0 0 0 0 1  PHQ - 2 Score 0 0 0 0 2  Altered sleeping 0 - 3 3 1   Tired, decreased energy 0 - 1 3 1   Change in appetite 0 - 0 3 1  Feeling bad or failure about yourself  0 - 0 0 0  Trouble concentrating 0 - 0 0 1  Moving slowly or fidgety/restless 0 - 0 0 1  Suicidal thoughts 0 - 0 0 0  PHQ-9 Score 0 - 4 9 7   Difficult doing work/chores Not difficult at all Not difficult at all Not difficult at all Somewhat difficult Somewhat difficult    phq 9 is negative   Fall Risk: Fall Risk  08/13/2019 08/11/2019 07/08/2019 08/26/2018 08/07/2018  Falls in the past year? 0 0 0 Yes Yes  Number falls in past yr: 0 0 0 1 1  Injury with Fall? 0 0 0 Yes Yes  Comment - - - - concussion  Risk Factor Category  - - - High Fall Risk High Fall Risk  Risk for fall due to : - Impaired balance/gait - History of fall(s);Impaired balance/gait;Impaired vision Impaired vision;Impaired balance/gait;Medication side effect  Risk for fall due to: Comment - - - - wears eyeglasses; MS; vertigo  Follow up - Falls prevention discussed - Falls evaluation completed Falls evaluation completed;Education provided;Falls prevention discussed    Functional Status Survey: Is the patient deaf or have difficulty hearing?: Yes Does the patient have difficulty seeing, even when wearing glasses/contacts?: Yes Does the patient have difficulty concentrating, remembering, or making decisions?: No Does the patient have difficulty walking or climbing stairs?: No Does the patient have difficulty dressing or bathing?: No Does the patient have difficulty doing errands alone such as visiting a doctor's office or shopping?: No    Assessment & Plan  1. Neck pain, chronic  She is waiting for pain clinic  - pregabalin (LYRICA) 50 MG capsule; Take 1 capsule (50 mg total) by mouth 3 (three) times daily.  Dispense: 90 capsule; Refill: 0 - traMADol (ULTRAM) 50 MG tablet; Take 1 tablet (50 mg  total) by mouth every 8 (eight) hours as needed for up to 5 days.  Dispense: 15 tablet; Refill: 0 - tiZANidine (ZANAFLEX) 2 MG tablet; Take 1 tablet (2 mg total) by mouth 3 (three) times daily.  Dispense: 30 tablet; Refill: 0  2. Chronic bilateral low back pain with right-sided sciatica   3. Fibromyalgia  - pregabalin (LYRICA) 50 MG capsule; Take 1 capsule (50 mg total) by mouth 3 (three) times daily.  Dispense: 90 capsule; Refill: 0 - traMADol (ULTRAM) 50 MG tablet; Take 1 tablet (50 mg total) by mouth every 8 (eight) hours as needed for up to 5 days.  Dispense: 15 tablet; Refill: 0 - tiZANidine (ZANAFLEX) 2 MG tablet; Take 1 tablet (2 mg total) by mouth 3 (three) times daily.  Dispense: 30 tablet; Refill: 0

## 2019-09-01 ENCOUNTER — Ambulatory Visit (INDEPENDENT_AMBULATORY_CARE_PROVIDER_SITE_OTHER): Payer: Medicare Other | Admitting: Family Medicine

## 2019-09-01 ENCOUNTER — Encounter: Payer: Self-pay | Admitting: Family Medicine

## 2019-09-01 ENCOUNTER — Other Ambulatory Visit: Payer: Self-pay

## 2019-09-01 VITALS — BP 128/64 | HR 67 | Temp 97.1°F | Resp 16 | Ht 68.0 in | Wt 176.4 lb

## 2019-09-01 DIAGNOSIS — G8929 Other chronic pain: Secondary | ICD-10-CM

## 2019-09-01 DIAGNOSIS — Z23 Encounter for immunization: Secondary | ICD-10-CM

## 2019-09-01 DIAGNOSIS — R319 Hematuria, unspecified: Secondary | ICD-10-CM | POA: Diagnosis not present

## 2019-09-01 DIAGNOSIS — N3941 Urge incontinence: Secondary | ICD-10-CM | POA: Diagnosis not present

## 2019-09-01 DIAGNOSIS — M542 Cervicalgia: Secondary | ICD-10-CM

## 2019-09-01 DIAGNOSIS — R1032 Left lower quadrant pain: Secondary | ICD-10-CM

## 2019-09-01 LAB — POCT URINALYSIS DIPSTICK
Bilirubin, UA: NEGATIVE
Glucose, UA: NEGATIVE
Ketones, UA: NEGATIVE
Nitrite, UA: NEGATIVE
Protein, UA: POSITIVE — AB
Spec Grav, UA: 1.02 (ref 1.010–1.025)
Urobilinogen, UA: 0.2 E.U./dL
pH, UA: 6 (ref 5.0–8.0)

## 2019-09-01 MED ORDER — CIPROFLOXACIN HCL 250 MG PO TABS: 250.0000 mg | ORAL_TABLET | Freq: Two times a day (BID) | ORAL | 0 refills | Status: DC

## 2019-09-01 NOTE — Progress Notes (Signed)
Name: Ashlee Richardson   MRN: [id number]    DOB: August 10, 1956   Date:09/01/2019       Progress Note  Subjective  Chief Complaint  Chief Complaint  Patient presents with  . Urinary Incontinence    Onset-2 weeks, lower left abdominal pain and has been having to wear a depends due to incontinence and states she seen a small amount of blood in her urine.  . Neck Pain    Medication has not helped.    HPI  Dysuria: she noticed acute onset of incontinence, no nocturnal enuresis, urgency and last night some hematuria and also some intermittent left lower quadrant pain, no personal history of kidney stones, no nausea or vomiting  Neck pain: not any better with medication, waiting for Dr. Lorin Mercy, but not sure she can wait any longer, having constant pain on her neck and not responding to medications. Lyrica, tramadol or zanaflex  Patient Active Problem List   Diagnosis Date Noted  . GERD (gastroesophageal reflux disease) 04/28/2018  . Pure hypercholesterolemia 04/28/2018  . Diverticulosis 04/28/2018  . RLS (restless legs syndrome) 04/28/2018  . Angina pectoris ([place]) 04/28/2018  . Vitamin D deficiency 12/27/2017  . Chronic fatigue, unspecified 12/27/2017  . Intractable migraine without aura and without status migrainosus 10/17/2015  . DDD (degenerative disc disease), cervical 05/23/2015  . Bilateral occipital neuralgia 05/23/2015  . DDD (degenerative disc disease), lumbar 05/23/2015  . Multiple sclerosis ([place]) 05/23/2015  . Cervical disc disorder with radiculopathy of cervical region 04/20/2015  . Right arm numbness 04/20/2015  . Right arm weakness 04/20/2015    Past Surgical History:  Procedure Laterality Date  . BACK SURGERY  1991?   Pt usure of date  . BREAST BIOPSY Right   . COLONOSCOPY  09/2007  . COLONOSCOPY WITH PROPOFOL N/A 08/28/2017   Procedure: COLONOSCOPY WITH PROPOFOL;  Surgeon: Christene Lye, MD;  Location: [facility] ENDOSCOPY;  Service: Endoscopy;  Laterality: N/A;   . ESOPHAGOGASTRODUODENOSCOPY (EGD) WITH PROPOFOL N/A 11/24/2018   Procedure: ESOPHAGOGASTRODUODENOSCOPY (EGD) WITH PROPOFOL;  Surgeon: Lin Landsman, MD;  Location: [facility] ENDOSCOPY;  Service: Gastroenterology;  Laterality: N/A;  . Foot sugery Right    3rd toe from the right"    Family History  Problem Relation Age of Onset  . Heart disease Mother   . Diabetes Mother   . Kidney disease Mother   . Hypertension Mother   . Heart disease Father   . Hyperlipidemia Father   . Hypertension Father   . Heart disease Other   . Cancer Other        Breast  . Diabetes Other   . Hyperlipidemia Sister   . Hypertension Sister   . Kidney disease Sister   . Diabetes Sister     Social History   Socioeconomic History  . Marital status: Single    Spouse name: Not on file  . Number of children: 2  . Years of education: Not on file  . Highest education level: Associate degree: academic program  Occupational History  . 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 Financial resource strain: Somewhat hard  . Food insecurity    Worry: Never true    Inability: Never true  . Transportation needs    Medical: No    Non-medical: No  Tobacco Use  . Smoking status: Never Smoker  . Smokeless tobacco: Never Used  . Tobacco comment: smoking  cessation materials not required  Substance and Sexual Activity  . Alcohol use: No    Alcohol/week: 0.0 standard drinks  . Drug use: No  . Sexual activity: Yes    Birth control/protection: Post-menopausal  Lifestyle  . Physical activity    Days per week: 3 days    Minutes per session: 60 min  . Stress: To some extent  Relationships  . Social connections    Talks on phone: More than three times a week    Gets together: More than three times a week    Attends religious service: More than 4 times per year    Active member of club or organization: Yes    Attends meetings of  clubs or organizations: More than 4 times per year    Relationship status: Never married  . Intimate partner violence    Fear of current or ex partner: No    Emotionally abused: No    Physically abused: No    Forced sexual activity: No  Other Topics Concern  . Not on file  Social History Narrative   Lives at home by herself.   Disable from chronic back pain since 1993   Diagnosed with MS in the 2000's     Current Outpatient Medications:  .  Calcium-Magnesium-Vitamin D 300-150-400 MG-MG-UNIT TABS, Take by mouth., Disp: , Rfl:  .  cholecalciferol (VITAMIN D3) 25 MCG (1000 UT) tablet, Take 1,000 Units by mouth daily., Disp: , Rfl:  .  DEXILANT 60 MG capsule, TAKE 1 CAPSULE BY MOUTH EVERY DAY, Disp: 90 capsule, Rfl: 0 .  donepezil (ARICEPT) 5 MG tablet, Take 1 tablet by mouth daily., Disp: , Rfl:  .  DULoxetine (CYMBALTA) 60 MG capsule, Take 60 mg by mouth daily., Disp: , Rfl:  .  erythromycin ophthalmic ointment, , Disp: , Rfl:  .  ferrous sulfate 325 (65 FE) MG EC tablet, Take 325 mg by mouth daily., Disp: , Rfl:  .  fluticasone (FLONASE) 50 MCG/ACT nasal spray, SPRAY 1 SPRAY INTO EACH NOSTRIL EVERY DAY, Disp: , Rfl: 0 .  magnesium oxide (MAG-OX) 400 MG tablet, Take 400 mg by mouth daily., Disp: , Rfl:  .  meclizine (ANTIVERT) 25 MG tablet, Take 25 mg by mouth 3 (three) times daily as needed for dizziness., Disp: , Rfl:  .  meloxicam (MOBIC) 15 MG tablet, Take 1 tablet (15 mg total) by mouth daily., Disp: 90 tablet, Rfl: 0 .  MURO 128 5 % ophthalmic solution, INSTILL 1 DROP IN RIGHT EYE THREE TIMES PER DAY, Disp: , Rfl:  .  pregabalin (LYRICA) 50 MG capsule, Take 1 capsule (50 mg total) by mouth 3 (three) times daily., Disp: 90 capsule, Rfl: 0 .  rOPINIRole (REQUIP) 0.25 MG tablet, Take 1 tablet (0.25 mg total) by mouth at bedtime., Disp: 30 tablet, Rfl: 5 .  rosuvastatin (CRESTOR) 20 MG tablet, TAKE 1 TABLET BY MOUTH EVERY DAY, Disp: 90 tablet, Rfl: 0 .  SUMAtriptan (IMITREX) 100 MG  tablet, Take as directed PRN migraines, Disp: , Rfl:  .  tiZANidine (ZANAFLEX) 2 MG tablet, Take 1 tablet (2 mg total) by mouth 3 (three) times daily., Disp: 30 tablet, Rfl: 0 .  vitamin B-12 (CYANOCOBALAMIN) 1000 MCG tablet, Take 1,000 mcg by mouth daily., Disp: , Rfl:   Allergies  Allergen Reactions  . Fenofibric Acid   . Lipitor [Atorvastatin]   . Nsaids     I personally reviewed active problem list, medication list, allergies, family history, social history with the  patient/caregiver today.   ROS  Ten systems reviewed and is negative except as mentioned in HPI   Objective  Vitals:   09/01/19 0946  BP: 128/64  Pulse: 67  Resp: 16  Temp: (!) 97.1 F (36.2 C)  TempSrc: Temporal  SpO2: 98%  Weight: 176 lb 6.4 oz (80 kg)  Height: 5\' 8"  (1.727 m)    Body mass index is 26.82 kg/m.  Physical Exam  Constitutional: Patient appears well-developed and well-nourished. Overweight. No distress.  HEENT: head atraumatic, normocephalic, pupils equal and reactive to light, Cardiovascular: Normal rate, regular rhythm and normal heart sounds.  No murmur heard. No BLE edema. Pulmonary/Chest: Effort normal and breath sounds normal. No respiratory distress. Abdominal: Soft.  There is mild supra pubic discomfort, not sure if CV tenderness since she has chronic back pain  Muscular Skeletal: neck is very sore, decrease in rom, also pain during palpation of both backs, not CVA tenderness Psychiatric: Patient has a normal mood and affect. behavior is normal. Judgment and thought content normal.  Recent Results (from the past 2160 hour(s))  POCT glycosylated hemoglobin (Hb A1C)     Status: Normal   Collection Time: 07/08/19  9:49 AM  Result Value Ref Range   Hemoglobin A1C     HbA1c POC (<> result, manual entry)     HbA1c, POC (prediabetic range)     HbA1c, POC (controlled diabetic range) 6.2 0.0 - 7.0 %  I-STAT creatinine     Status: None   Collection Time: 08/08/19 11:34 AM  Result  Value Ref Range   Creatinine, Ser 0.80 0.44 - 1.00 mg/dL  POCT urinalysis dipstick     Status: Abnormal   Collection Time: 09/01/19  9:50 AM  Result Value Ref Range   Color, UA amber    Clarity, UA clear    Glucose, UA Negative Negative   Bilirubin, UA neg    Ketones, UA neg    Spec Grav, UA 1.020 1.010 - 1.025   Blood, UA Large    pH, UA 6.0 5.0 - 8.0   Protein, UA Positive (A) Negative    Comment: 100++   Urobilinogen, UA 0.2 0.2 or 1.0 E.U./dL   Nitrite, UA neg    Leukocytes, UA Trace (A) Negative   Appearance     Odor      PHQ2/9: Depression screen [facility] 2/9 09/01/2019 08/13/2019 08/11/2019 07/08/2019 08/26/2018  Decreased Interest 0 0 0 0 0  Down, Depressed, Hopeless 0 0 0 0 0  PHQ - 2 Score 0 0 0 0 0  Altered sleeping 0 0 - 3 3  Tired, decreased energy 0 0 - 1 3  Change in appetite 0 0 - 0 3  Feeling bad or failure about yourself  0 0 - 0 0  Trouble concentrating 0 0 - 0 0  Moving slowly or fidgety/restless 0 0 - 0 0  Suicidal thoughts 0 0 - 0 0  PHQ-9 Score 0 0 - 4 9  Difficult doing work/chores Not difficult at all Not difficult at all Not difficult at all Not difficult at all Somewhat difficult    phq 9 is negative   Fall Risk: Fall Risk  09/01/2019 08/13/2019 08/11/2019 07/08/2019 08/26/2018  Falls in the past year? 0 0 0 0 Yes  Number falls in past yr: 0 0 0 0 1  Injury with Fall? 0 0 0 0 Yes  Comment - - - - -  Risk Factor Category  - - - - High Fall  Risk  Risk for fall due to : - - Impaired balance/gait - History of fall(s);Impaired balance/gait;Impaired vision  Risk for fall due to: Comment - - - - -  Follow up - - Falls prevention discussed - Falls evaluation completed     Functional Status Survey: Is the patient deaf or have difficulty hearing?: No Does the patient have difficulty seeing, even when wearing glasses/contacts?: Yes Does the patient have difficulty concentrating, remembering, or making decisions?: No Does the patient have difficulty walking or  climbing stairs?: No Does the patient have difficulty dressing or bathing?: No Does the patient have difficulty doing errands alone such as visiting a doctor's office or shopping?: No   Assessment & Plan   1. Urge incontinence of urine  - POCT urinalysis dipstick - Urine Culture - ciprofloxacin (CIPRO) 250 MG tablet; Take 1 tablet (250 mg total) by mouth 2 (two) times daily.  Dispense: 6 tablet; Refill: 0  2. Left lower quadrant abdominal pain  - POCT urinalysis dipstick - Urine Culture - ciprofloxacin (CIPRO) 250 MG tablet; Take 1 tablet (250 mg total) by mouth 2 (two) times daily.  Dispense: 6 tablet; Refill: 0  3. Hematuria, unspecified type  - POCT urinalysis dipstick - Urine Culture - ciprofloxacin (CIPRO) 250 MG tablet; Take 1 tablet (250 mg total) by mouth 2 (two) times daily.  Dispense: 6 tablet; Refill: 0  Discussed symptoms of kidney stone and importance of returning if worsening of symptoms , nausea, vomiting, fever or chills.   4. Needs flu shot  refused  5. Neck pain, chronic  - Ambulatory referral to Neurosurgery

## 2019-09-03 LAB — URINE CULTURE
MICRO NUMBER:: 856897
SPECIMEN QUALITY:: ADEQUATE

## 2019-09-18 ENCOUNTER — Ambulatory Visit: Payer: Medicare Other | Admitting: Family Medicine

## 2019-09-23 ENCOUNTER — Ambulatory Visit: Payer: Medicare Other | Admitting: Family Medicine

## 2019-09-29 ENCOUNTER — Other Ambulatory Visit: Payer: Self-pay | Admitting: Family Medicine

## 2019-10-06 ENCOUNTER — Other Ambulatory Visit: Payer: Self-pay | Admitting: Family Medicine

## 2019-10-06 DIAGNOSIS — E785 Hyperlipidemia, unspecified: Secondary | ICD-10-CM

## 2019-10-23 ENCOUNTER — Encounter: Payer: Self-pay | Admitting: Family Medicine

## 2019-10-23 ENCOUNTER — Other Ambulatory Visit: Payer: Self-pay

## 2019-10-23 ENCOUNTER — Ambulatory Visit (INDEPENDENT_AMBULATORY_CARE_PROVIDER_SITE_OTHER): Payer: Medicare Other | Admitting: Family Medicine

## 2019-10-23 VITALS — BP 132/62 | HR 81 | Temp 97.1°F | Resp 16 | Ht 68.0 in | Wt 180.5 lb

## 2019-10-23 DIAGNOSIS — R739 Hyperglycemia, unspecified: Secondary | ICD-10-CM

## 2019-10-23 DIAGNOSIS — E559 Vitamin D deficiency, unspecified: Secondary | ICD-10-CM

## 2019-10-23 DIAGNOSIS — R350 Frequency of micturition: Secondary | ICD-10-CM

## 2019-10-23 DIAGNOSIS — R32 Unspecified urinary incontinence: Secondary | ICD-10-CM

## 2019-10-23 DIAGNOSIS — E785 Hyperlipidemia, unspecified: Secondary | ICD-10-CM

## 2019-10-23 DIAGNOSIS — E538 Deficiency of other specified B group vitamins: Secondary | ICD-10-CM | POA: Diagnosis not present

## 2019-10-23 DIAGNOSIS — Z79899 Other long term (current) drug therapy: Secondary | ICD-10-CM | POA: Diagnosis not present

## 2019-10-23 DIAGNOSIS — G35 Multiple sclerosis: Secondary | ICD-10-CM | POA: Diagnosis not present

## 2019-10-23 LAB — POCT URINALYSIS DIPSTICK
Bilirubin, UA: NEGATIVE
Blood, UA: NEGATIVE
Glucose, UA: NEGATIVE
Ketones, UA: NEGATIVE
Nitrite, UA: NEGATIVE
Protein, UA: NEGATIVE
Spec Grav, UA: 1.015 (ref 1.010–1.025)
Urobilinogen, UA: 0.2 E.U./dL
pH, UA: 6 (ref 5.0–8.0)

## 2019-10-23 NOTE — Progress Notes (Signed)
Name: Ashlee Richardson   MRN: [id number]    DOB: Oct 25, 1956   Date:10/23/2019       Progress Note  Subjective  Chief Complaint  Chief Complaint  Patient presents with  . Urinary Frequency    Unable to hold her urine  . Joint Swelling    Right Ankle Edema-intermittently    HPI  Recurrent UTI: she was treated 08/2019, she states symptoms of dysuria, left lower quadrant pain and hematuria resolved but she continues to have stress incontinence and also incontinence when she goes from sitting to stand position and has to wear pads at work. We will recheck urine culture and refer to urologist for further evaluation. She seems to have urge, stress and overflow incontinence   Right ankle edema: she has intermittent swelling on both ankles, but right worse than left, episodes are intermittent about once week, resolves by the morning.   Hyperglycemia: last A1C was up to 6.2 %, she denies polyphagia or polydipsia but she likes to drink water. We will recheck labs  Vitamin D and B12 deficiency: she would like to be rechecked  MS: under the care of Dr. Manuella Ghazi , had a positive IG for herpes on spinal tap but was given reassurance by Dr. Ola Spurr. She denies weakness, but has intermittent tingling an numbness but no longer on arms, states only on her toes now.  Stable and not on medication. MRI showed white plaques. She has some tremors and sometimes decrease in memory, on duloxetine and lyrica  Epigastric pain: she seemed uncomfortable , she states it is because of her hiatal hernia, no diaphoresis or nausea, explained females can have atypical chest pain, offered an EKG and labs, but she refused, she states she just saw Dr. Clayborn Bigness and her heart is fine, she had this pain before.   Patient Active Problem List   Diagnosis Date Noted  . GERD (gastroesophageal reflux disease) 04/28/2018  . Pure hypercholesterolemia 04/28/2018  . Diverticulosis 04/28/2018  . RLS (restless legs syndrome) 04/28/2018   . Angina pectoris ([place]) 04/28/2018  . Vitamin D deficiency 12/27/2017  . Chronic fatigue, unspecified 12/27/2017  . Intractable migraine without aura and without status migrainosus 10/17/2015  . DDD (degenerative disc disease), cervical 05/23/2015  . Bilateral occipital neuralgia 05/23/2015  . DDD (degenerative disc disease), lumbar 05/23/2015  . Multiple sclerosis ([place]) 05/23/2015  . Cervical disc disorder with radiculopathy of cervical region 04/20/2015  . Right arm numbness 04/20/2015  . Right arm weakness 04/20/2015    Past Surgical History:  Procedure Laterality Date  . BACK SURGERY  1991?   Pt usure of date  . BREAST BIOPSY Right   . COLONOSCOPY  09/2007  . COLONOSCOPY WITH PROPOFOL N/A 08/28/2017   Procedure: COLONOSCOPY WITH PROPOFOL;  Surgeon: Christene Lye, MD;  Location: [facility] ENDOSCOPY;  Service: Endoscopy;  Laterality: N/A;  . ESOPHAGOGASTRODUODENOSCOPY (EGD) WITH PROPOFOL N/A 11/24/2018   Procedure: ESOPHAGOGASTRODUODENOSCOPY (EGD) WITH PROPOFOL;  Surgeon: Lin Landsman, MD;  Location: [facility] ENDOSCOPY;  Service: Gastroenterology;  Laterality: N/A;  . Foot sugery Right    3rd toe from the right"    Family History  Problem Relation Age of Onset  . Heart disease Mother   . Diabetes Mother   . Kidney disease Mother   . Hypertension Mother   . Heart disease Father   . Hyperlipidemia Father   . Hypertension Father   . Heart disease Other   . Cancer Other        Breast  .  Diabetes Other   . Hyperlipidemia Sister   . Hypertension Sister   . Kidney disease Sister   . Diabetes Sister     Social History   Socioeconomic History  . Marital status: Single    Spouse name: Not on file  . Number of children: 2  . Years of education: Not on file  . Highest education level: Associate degree: academic program  Occupational History  . 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  Financial resource strain: Somewhat hard  . Food insecurity    Worry: Never true    Inability: Never true  . Transportation needs    Medical: No    Non-medical: No  Tobacco Use  . Smoking status: Never Smoker  . Smokeless tobacco: Never Used  . Tobacco comment: smoking cessation materials not required  Substance and Sexual Activity  . Alcohol use: No    Alcohol/week: 0.0 standard drinks  . Drug use: No  . Sexual activity: Yes    Birth control/protection: Post-menopausal  Lifestyle  . Physical activity    Days per week: 3 days    Minutes per session: 60 min  . Stress: To some extent  Relationships  . Social connections    Talks on phone: More than three times a week    Gets together: More than three times a week    Attends religious service: More than 4 times per year    Active member of club or organization: Yes    Attends meetings of clubs or organizations: More than 4 times per year    Relationship status: Never married  . Intimate partner violence    Fear of current or ex partner: No    Emotionally abused: No    Physically abused: No    Forced sexual activity: No  Other Topics Concern  . Not on file  Social History Narrative   Lives at home by herself.   Disable from chronic back pain since 1993   Diagnosed with MS in the 2000's     Current Outpatient Medications:  .  Calcium-Magnesium-Vitamin D 300-150-400 MG-MG-UNIT TABS, Take by mouth., Disp: , Rfl:  .  cholecalciferol (VITAMIN D3) 25 MCG (1000 UT) tablet, Take 1,000 Units by mouth daily., Disp: , Rfl:  .  DEXILANT 60 MG capsule, TAKE 1 CAPSULE BY MOUTH EVERY DAY, Disp: 90 capsule, Rfl: 0 .  donepezil (ARICEPT) 5 MG tablet, Take 1 tablet by mouth daily., Disp: , Rfl:  .  DULoxetine (CYMBALTA) 60 MG capsule, Take 60 mg by mouth daily., Disp: , Rfl:  .  erythromycin ophthalmic ointment, , Disp: , Rfl:  .  ferrous sulfate 325 (65 FE) MG EC tablet, Take 325 mg by mouth daily., Disp: , Rfl:  .  fluticasone  (FLONASE) 50 MCG/ACT nasal spray, SPRAY 1 SPRAY INTO EACH NOSTRIL EVERY DAY, Disp: , Rfl: 0 .  magnesium oxide (MAG-OX) 400 MG tablet, Take 400 mg by mouth daily., Disp: , Rfl:  .  meclizine (ANTIVERT) 25 MG tablet, Take 25 mg by mouth 3 (three) times daily as needed for dizziness., Disp: , Rfl:  .  meloxicam (MOBIC) 15 MG tablet, TAKE 1 TABLET BY MOUTH EVERY DAY, Disp: 90 tablet, Rfl: 0 .  MURO 128 5 % ophthalmic solution, INSTILL 1 DROP IN RIGHT EYE THREE TIMES PER DAY, Disp: , Rfl:  .  pregabalin (LYRICA) 50 MG capsule, Take  1 capsule (50 mg total) by mouth 3 (three) times daily., Disp: 90 capsule, Rfl: 0 .  rOPINIRole (REQUIP) 0.25 MG tablet, Take 1 tablet (0.25 mg total) by mouth at bedtime., Disp: 30 tablet, Rfl: 5 .  rosuvastatin (CRESTOR) 20 MG tablet, TAKE 1 TABLET BY MOUTH EVERY DAY, Disp: 90 tablet, Rfl: 0 .  SUMAtriptan (IMITREX) 100 MG tablet, Take as directed PRN migraines, Disp: , Rfl:  .  tiZANidine (ZANAFLEX) 2 MG tablet, Take 1 tablet (2 mg total) by mouth 3 (three) times daily., Disp: 30 tablet, Rfl: 0 .  vitamin B-12 (CYANOCOBALAMIN) 1000 MCG tablet, Take 1,000 mcg by mouth daily., Disp: , Rfl:  .  ciprofloxacin (CIPRO) 250 MG tablet, Take 1 tablet (250 mg total) by mouth 2 (two) times daily. (Patient not taking: Reported on 10/23/2019), Disp: 6 tablet, Rfl: 0  Allergies  Allergen Reactions  . Fenofibric Acid   . Lipitor [Atorvastatin]   . Nsaids     I personally reviewed active problem list, medication list, allergies, family history, social history, health maintenance with the patient/caregiver today.   ROS  Constitutional: Negative for fever or weight change.  Respiratory: Negative for cough and shortness of breath.   Cardiovascular: Negative for chest pain or palpitations.  Gastrointestinal: Positive for epigastric  abdominal pain, no bowel changes.  Musculoskeletal: Negative for gait problem or joint swelling.  Skin: Negative for rash.  Neurological: Negative  for dizziness or headache.  No other specific complaints in a complete review of systems (except as listed in HPI above).  Objective  Vitals:   10/23/19 1004  BP: 132/62  Pulse: 81  Resp: 16  Temp: (!) 97.1 F (36.2 C)  TempSrc: Temporal  SpO2: 99%  Weight: 180 lb 8 oz (81.9 kg)  Height: 5\' 8"  (1.727 m)    Body mass index is 27.44 kg/m.  Physical Exam  Constitutional: Patient appears well-developed and well-nourished. No distress.  HEENT: head atraumatic, normocephalic, pupils equal and reactive to light Cardiovascular: Normal rate, regular rhythm and normal heart sounds.  No murmur heard. No BLE edema. Pulmonary/Chest: Effort normal and breath sounds normal. No respiratory distress. Abdominal: Soft.  There is epigastric  tenderness. Psychiatric: Patient has a normal mood and affect. behavior is normal. Judgment and thought content normal.  Recent Results (from the past 2160 hour(s))  I-STAT creatinine     Status: None   Collection Time: 08/08/19 11:34 AM  Result Value Ref Range   Creatinine, Ser 0.80 0.44 - 1.00 mg/dL  Urine Culture     Status: Abnormal   Collection Time: 09/01/19 12:00 AM   Specimen: Urine  Result Value Ref Range   MICRO NUMBER: [id number]    SPECIMEN QUALITY: Adequate    Sample Source URINE, CLEAN CATCH    STATUS: FINAL    ISOLATE 1: Proteus mirabilis (A)     Comment: Greater than 100,000 CFU/mL of Proteus mirabilis      Susceptibility   Proteus mirabilis - URINE CULTURE, REFLEX    AMOX/CLAVULANIC <=2 Sensitive     AMPICILLIN <=2 Sensitive     AMPICILLIN/SULBACTAM <=2 Sensitive     CEFAZOLIN* <=4 Not Reportable      * For infections other than uncomplicated UTIcaused by E. coli, K. pneumoniae or P. mirabilis:Cefazolin is resistant if MIC > or = 8 mcg/mL.(Distinguishing susceptible versus intermediatefor isolates with MIC < or = 4 mcg/mL requiresadditional testing.)For uncomplicated UTI caused by E. coli,K. pneumoniae or P. mirabilis: Cefazolin  issusceptible if MIC <32 mcg/mL and  predictssusceptible to the oral agents cefaclor, cefdinir,cefpodoxime, cefprozil, cefuroxime, cephalexinand loracarbef.    CEFEPIME <=1 Sensitive     CEFTRIAXONE <=1 Sensitive     CIPROFLOXACIN <=0.25 Sensitive     LEVOFLOXACIN <=0.12 Sensitive     ERTAPENEM <=0.5 Sensitive     GENTAMICIN <=1 Sensitive     IMIPENEM 1 Sensitive     NITROFURANTOIN 128 Resistant     PIP/TAZO <=4 Sensitive     TOBRAMYCIN <=1 Sensitive     TRIMETH/SULFA* <=20 Sensitive      * For infections other than uncomplicated UTIcaused by E. coli, K. pneumoniae or P. mirabilis:Cefazolin is resistant if MIC > or = 8 mcg/mL.(Distinguishing susceptible versus intermediatefor isolates with MIC < or = 4 mcg/mL requiresadditional testing.)For uncomplicated UTI caused by E. coli,K. pneumoniae or P. mirabilis: Cefazolin issusceptible if MIC <32 mcg/mL and predictssusceptible to the oral agents cefaclor, cefdinir,cefpodoxime, cefprozil, cefuroxime, cephalexinand loracarbef.Legend:S = Susceptible  I = IntermediateR = Resistant  NS = Not susceptible* = Not tested  NR = Not reported**NN = See antimicrobic comments  POCT urinalysis dipstick     Status: Abnormal   Collection Time: 09/01/19  9:50 AM  Result Value Ref Range   Color, UA amber    Clarity, UA clear    Glucose, UA Negative Negative   Bilirubin, UA neg    Ketones, UA neg    Spec Grav, UA 1.020 1.010 - 1.025   Blood, UA Large    pH, UA 6.0 5.0 - 8.0   Protein, UA Positive (A) Negative    Comment: 100++   Urobilinogen, UA 0.2 0.2 or 1.0 E.U./dL   Nitrite, UA neg    Leukocytes, UA Trace (A) Negative   Appearance     Odor    POCT urinalysis dipstick     Status: Abnormal   Collection Time: 10/23/19 10:13 AM  Result Value Ref Range   Color, UA amber    Clarity, UA cloudy    Glucose, UA Negative Negative   Bilirubin, UA neg    Ketones, UA neg    Spec Grav, UA 1.015 1.010 - 1.025   Blood, UA neg    pH, UA 6.0 5.0 - 8.0   Protein, UA  Negative Negative   Urobilinogen, UA 0.2 0.2 or 1.0 E.U./dL   Nitrite, UA neg    Leukocytes, UA Moderate (2+) (A) Negative   Appearance     Odor        PHQ2/9: Depression screen [facility] 2/9 10/23/2019 09/01/2019 08/13/2019 08/11/2019 07/08/2019  Decreased Interest 0 0 0 0 0  Down, Depressed, Hopeless 0 0 0 0 0  PHQ - 2 Score 0 0 0 0 0  Altered sleeping 3 0 0 - 3  Tired, decreased energy 3 0 0 - 1  Change in appetite 0 0 0 - 0  Feeling bad or failure about yourself  0 0 0 - 0  Trouble concentrating 0 0 0 - 0  Moving slowly or fidgety/restless 0 0 0 - 0  Suicidal thoughts 0 0 0 - 0  PHQ-9 Score 6 0 0 - 4  Difficult doing work/chores Not difficult at all Not difficult at all Not difficult at all Not difficult at all Not difficult at all    phq 9 is negative   Fall Risk: Fall Risk  10/23/2019 09/01/2019 08/13/2019 08/11/2019 07/08/2019  Falls in the past year? 0 0 0 0 0  Number falls in past yr: 0 0 0 0 0  Injury with  Fall? 0 0 0 0 0  Comment - - - - -  Risk Factor Category  - - - - -  Risk for fall due to : - - - Impaired balance/gait -  Risk for fall due to: Comment - - - - -  Follow up - - - Falls prevention discussed -     Functional Status Survey: Is the patient deaf or have difficulty hearing?: No Does the patient have difficulty seeing, even when wearing glasses/contacts?: Yes Does the patient have difficulty concentrating, remembering, or making decisions?: No Does the patient have difficulty walking or climbing stairs?: No Does the patient have difficulty dressing or bathing?: No Does the patient have difficulty doing errands alone such as visiting a doctor's office or shopping?: No    Assessment & Plan  1. Frequency of urination  - POCT urinalysis dipstick  2. B12 deficiency  - CBC with Differential/Platelet - Vitamin B12  3. Dyslipidemia  - Lipid panel  4. Multiple sclerosis (HCC)  - COMPLETE METABOLIC PANEL WITH GFR  5. Hyperglycemia  - COMPLETE  METABOLIC PANEL WITH GFR - Hemoglobin A1c  6. Vitamin D deficiency  - VITAMIN D 25 Hydroxy (Vit-D Deficiency, Fractures)  7. Incontinence of urine in female  - Urine Culture - Ambulatory referral to Urology  8. Encounter for long-term (current) use of high-risk medication  - COMPLETE METABOLIC PANEL WITH GFR - CBC with Differential/Platelet

## 2019-10-25 LAB — LIPID PANEL
Cholesterol: 201 mg/dL — ABNORMAL HIGH (ref <200)
HDL: 68 mg/dL (ref >=50)
LDL Cholesterol (Calc): 110 mg/dL (calc) — ABNORMAL HIGH
Non-HDL Cholesterol (Calc): 133 mg/dL (calc) — ABNORMAL HIGH (ref <130)
Total CHOL/HDL Ratio: 3 (calc) (ref <5.0)
Triglycerides: 115 mg/dL (ref <150)

## 2019-10-25 LAB — URINE CULTURE
MICRO NUMBER:: 1050206
SPECIMEN QUALITY:: ADEQUATE

## 2019-10-25 LAB — CBC WITH DIFFERENTIAL/PLATELET
Absolute Monocytes: 296 cells/uL (ref 200–950)
Basophils Absolute: 68 cells/uL (ref 0–200)
Basophils Relative: 1.7 %
Eosinophils Absolute: 108 cells/uL (ref 15–500)
Eosinophils Relative: 2.7 %
HCT: 38.9 % (ref 35.0–45.0)
Hemoglobin: 12.5 g/dL (ref 11.7–15.5)
Lymphs Abs: 1792 cells/uL (ref 850–3900)
MCH: 27.1 pg (ref 27.0–33.0)
MCHC: 32.1 g/dL (ref 32.0–36.0)
MCV: 84.2 fL (ref 80.0–100.0)
MPV: 12.4 fL (ref 7.5–12.5)
Monocytes Relative: 7.4 %
Neutro Abs: 1736 cells/uL (ref 1500–7800)
Neutrophils Relative %: 43.4 %
Platelets: 198 10*3/uL (ref 140–400)
RBC: 4.62 10*6/uL (ref 3.80–5.10)
RDW: 12.3 % (ref 11.0–15.0)
Total Lymphocyte: 44.8 %
WBC: 4 10*3/uL (ref 3.8–10.8)

## 2019-10-25 LAB — COMPLETE METABOLIC PANEL WITH GFR
AG Ratio: 2.1 (calc) (ref 1.0–2.5)
ALT: 13 U/L (ref 6–29)
AST: 14 U/L (ref 10–35)
Albumin: 4.5 g/dL (ref 3.6–5.1)
Alkaline phosphatase (APISO): 55 U/L (ref 37–153)
BUN: 12 mg/dL (ref 7–25)
CO2: 28 mmol/L (ref 20–32)
Calcium: 9.7 mg/dL (ref 8.6–10.4)
Chloride: 106 mmol/L (ref 98–110)
Creat: 0.72 mg/dL (ref 0.50–0.99)
GFR, Est African American: 104 mL/min/{1.73_m2} (ref >=60)
GFR, Est Non African American: 90 mL/min/{1.73_m2} (ref >=60)
Globulin: 2.1 g/dL (calc) (ref 1.9–3.7)
Glucose, Bld: 105 mg/dL — ABNORMAL HIGH (ref 65–99)
Potassium: 4.1 mmol/L (ref 3.5–5.3)
Sodium: 143 mmol/L (ref 135–146)
Total Bilirubin: 0.5 mg/dL (ref 0.2–1.2)
Total Protein: 6.6 g/dL (ref 6.1–8.1)

## 2019-10-25 LAB — VITAMIN B12: Vitamin B-12: 782 pg/mL (ref 200–1100)

## 2019-10-25 LAB — HEMOGLOBIN A1C
Hgb A1c MFr Bld: 6 % of total Hgb — ABNORMAL HIGH (ref <5.7)
Mean Plasma Glucose: 126 (calc)
eAG (mmol/L): 7 (calc)

## 2019-10-25 LAB — VITAMIN D 25 HYDROXY (VIT D DEFICIENCY, FRACTURES): Vit D, 25-Hydroxy: 32 ng/mL (ref 30–100)

## 2019-10-26 ENCOUNTER — Encounter: Payer: Self-pay | Admitting: Family Medicine

## 2019-10-26 DIAGNOSIS — R7303 Prediabetes: Secondary | ICD-10-CM | POA: Insufficient documentation

## 2019-10-27 ENCOUNTER — Ambulatory Visit: Payer: Medicare Other | Admitting: Orthopaedic Surgery

## 2019-11-17 ENCOUNTER — Ambulatory Visit: Payer: Self-pay

## 2019-11-17 ENCOUNTER — Ambulatory Visit (INDEPENDENT_AMBULATORY_CARE_PROVIDER_SITE_OTHER): Payer: Medicare Other | Admitting: Orthopaedic Surgery

## 2019-11-17 ENCOUNTER — Other Ambulatory Visit: Payer: Self-pay

## 2019-11-17 ENCOUNTER — Encounter: Payer: Self-pay | Admitting: Orthopaedic Surgery

## 2019-11-17 DIAGNOSIS — M5030 Other cervical disc degeneration, unspecified cervical region: Secondary | ICD-10-CM

## 2019-11-17 DIAGNOSIS — M5136 Other intervertebral disc degeneration, lumbar region: Secondary | ICD-10-CM

## 2019-11-17 DIAGNOSIS — M542 Cervicalgia: Secondary | ICD-10-CM

## 2019-11-17 DIAGNOSIS — M5010 Cervical disc disorder with radiculopathy, unspecified cervical region: Secondary | ICD-10-CM | POA: Diagnosis not present

## 2019-11-17 DIAGNOSIS — M5441 Lumbago with sciatica, right side: Secondary | ICD-10-CM | POA: Diagnosis not present

## 2019-11-17 DIAGNOSIS — G8929 Other chronic pain: Secondary | ICD-10-CM

## 2019-11-17 DIAGNOSIS — Z981 Arthrodesis status: Secondary | ICD-10-CM

## 2019-11-17 MED ORDER — PREDNISONE 10 MG (21) PO TBPK: ORAL_TABLET | Freq: Once | ORAL | 0 refills | Status: AC

## 2019-11-17 NOTE — Progress Notes (Addendum)
Office Visit Note   Patient: Ashlee Richardson           Date of Birth: 1956-03-23           MRN: [id number] Visit Date: 11/17/2019              Requested by: Steele Sizer, [address] [place],  [place] [address] PCP: Steele Sizer, MD   Assessment & Plan: Visit Diagnoses:  1. Neck pain   2. Chronic low back pain with right-sided sciatica, unspecified back pain laterality   3. Cervical disc disorder with radiculopathy of cervical region   4. DDD (degenerative disc disease), cervical   5. DDD (degenerative disc disease), lumbar   6. History of lumbar fusion     Plan: We discussed anti-inflammatories continue muscle relaxants walking program, core strengthening.  Activity modification avoiding repetitive bending turning twisting and stooping.  Recheck 5 weeks.  Follow-Up Instructions: Return in about 5 weeks (around 12/22/2019).   Orders:  Orders Placed This Encounter  Procedures  . XR Cervical Spine 2 or 3 views  . XR Lumbar Spine 2-3 Views   Meds ordered this encounter  Medications  . predniSONE (STERAPRED UNI-PAK 21 TAB) 10 MG (21) TBPK tablet    Sig: Take by mouth once for 1 dose. TAKE AND TAPER  AS INSTRUCTED WITH FOOD.    Dispense:  21 tablet    Refill:  0      Procedures: No procedures performed   Clinical Data: No additional findings.   Subjective: Chief Complaint  Patient presents with  . Neck - Pain  . Lower Back - Pain    HPI 63 year old female seen with neck pain for 3 months and previous lumbar fusion by Dr. Raquel Sarna in the 90s with U right instrumented fusion.  Patient was in pain management with Dr. Gerald Stabs for several years since then he is moved to this clinic has been closed.  She states the last 5 years she has had some increased back pain and right leg pain.  Neck pain bothers her more on the left than right side.  She has some numbness radiates into her left arm no history of injury to her neck.  She denies associated chest  pain or shortness of breath no chills or fever.  Positive history of multiple sclerosis. Patient been treated with anti-inflammatories muscle relaxants, gabapentin first and then later Lyrica.  She is on chronic Cymbalta. Review of Systems plus for vitamin D deficiency with osteopenia, GERD, history of multiple sclerosis, previous lumbar  fusion increased cholesterol restless leg syndrome.  Prediabetes.   Objective: Vital Signs: There were no vitals taken for this visit.  Physical Exam Constitutional:      Appearance: She is well-developed.  HENT:     Head: Normocephalic.     Right Ear: External ear normal.     Left Ear: External ear normal.  Eyes:     Pupils: Pupils are equal, round, and reactive to light.  Neck:     Thyroid: No thyromegaly.     Trachea: No tracheal deviation.  Cardiovascular:     Rate and Rhythm: Normal rate.  Pulmonary:     Effort: Pulmonary effort is normal.  Abdominal:     Palpations: Abdomen is soft.  Skin:    General: Skin is warm and dry.  Neurological:     Mental Status: She is alert and oriented to person, place, and time.  Psychiatric:  Behavior: Behavior normal.     Ortho Exam well-healed lumbar incision.  Lower extremity reflexes are intact.  No quad or hamstring weakness anterior tib is intact.  Specialty Comments:  No specialty comments available.  Imaging: No results found.   PMFS History: Patient Active Problem List   Diagnosis Date Noted  . History of lumbar fusion 11/22/2019  . Pre-diabetes 10/26/2019  . GERD (gastroesophageal reflux disease) 04/28/2018  . Pure hypercholesterolemia 04/28/2018  . Diverticulosis 04/28/2018  . RLS (restless legs syndrome) 04/28/2018  . Angina pectoris ([employer]) 04/28/2018  . Vitamin D deficiency 12/27/2017  . Chronic fatigue, unspecified 12/27/2017  . Intractable migraine without aura and without status migrainosus 10/17/2015  . DDD (degenerative disc disease), cervical 05/23/2015  .  Bilateral occipital neuralgia 05/23/2015  . DDD (degenerative disc disease), lumbar 05/23/2015  . Multiple sclerosis ([place]) 05/23/2015  . Cervical disc disorder with radiculopathy of cervical region 04/20/2015  . Right arm numbness 04/20/2015  . Right arm weakness 04/20/2015   Past Medical History:  Diagnosis Date  . B12 deficiency 12/27/2017  . Diverticulitis   . Fibromyalgia   . GERD (gastroesophageal reflux disease)   . High cholesterol   . Migraine   . MS (multiple sclerosis) ([place])   . PUD (peptic ulcer disease) 04/28/2018    Family History  Problem Relation Age of Onset  . Heart disease Mother   . Diabetes Mother   . Kidney disease Mother   . Hypertension Mother   . Heart disease Father   . Hyperlipidemia Father   . Hypertension Father   . Heart disease Other   . Cancer Other        Breast  . Diabetes Other   . Hyperlipidemia Sister   . Hypertension Sister   . Kidney disease Sister   . Diabetes Sister     Past Surgical History:  Procedure Laterality Date  . BACK SURGERY  1991?   Pt usure of date  . BREAST BIOPSY Right   . COLONOSCOPY  09/2007  . COLONOSCOPY WITH PROPOFOL N/A 08/28/2017   Procedure: COLONOSCOPY WITH PROPOFOL;  Surgeon: Christene Lye, MD;  Location: [facility] ENDOSCOPY;  Service: Endoscopy;  Laterality: N/A;  . ESOPHAGOGASTRODUODENOSCOPY (EGD) WITH PROPOFOL N/A 11/24/2018   Procedure: ESOPHAGOGASTRODUODENOSCOPY (EGD) WITH PROPOFOL;  Surgeon: Lin Landsman, MD;  Location: [facility] ENDOSCOPY;  Service: Gastroenterology;  Laterality: N/A;  . Foot sugery Right    3rd toe from the right"   Social History   Occupational History  . 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 Smoking status: Never Smoker  . Smokeless tobacco: Never Used  . Tobacco comment: smoking cessation materials not required  Substance and Sexual Activity  . Alcohol use: No    Alcohol/week: 0.0 standard  drinks  . Drug use: No  . Sexual activity: Yes    Birth control/protection: Post-menopausal

## 2019-11-22 DIAGNOSIS — Z981 Arthrodesis status: Secondary | ICD-10-CM | POA: Insufficient documentation

## 2019-11-23 ENCOUNTER — Ambulatory Visit (INDEPENDENT_AMBULATORY_CARE_PROVIDER_SITE_OTHER): Payer: Medicare Other | Admitting: Urology

## 2019-11-23 ENCOUNTER — Other Ambulatory Visit: Payer: Self-pay

## 2019-11-23 ENCOUNTER — Encounter: Payer: Self-pay | Admitting: Urology

## 2019-11-23 VITALS — BP 162/68 | HR 68 | Ht 68.0 in | Wt 179.0 lb

## 2019-11-23 DIAGNOSIS — R32 Unspecified urinary incontinence: Secondary | ICD-10-CM | POA: Diagnosis not present

## 2019-11-23 DIAGNOSIS — N3946 Mixed incontinence: Secondary | ICD-10-CM | POA: Diagnosis not present

## 2019-11-23 LAB — URINALYSIS, COMPLETE
Bilirubin, UA: NEGATIVE
Glucose, UA: NEGATIVE
Ketones, UA: NEGATIVE
Leukocytes,UA: NEGATIVE
Nitrite, UA: NEGATIVE
Protein,UA: NEGATIVE
RBC, UA: NEGATIVE
Specific Gravity, UA: <1.005 — ABNORMAL LOW (ref 1.005–1.030)
Urobilinogen, Ur: 0.2 mg/dL (ref 0.2–1.0)
pH, UA: 6.5 (ref 5.0–7.5)

## 2019-11-23 LAB — MICROSCOPIC EXAMINATION
RBC, Urine: NONE SEEN /hpf (ref 0–2)
WBC, UA: NONE SEEN /hpf (ref 0–5)

## 2019-11-23 LAB — BLADDER SCAN AMB NON-IMAGING

## 2019-11-23 MED ORDER — MIRABEGRON ER 50 MG PO TB24: 50.0000 mg | ORAL_TABLET | Freq: Every day | ORAL | 11 refills | Status: DC

## 2019-11-23 NOTE — Progress Notes (Signed)
11/23/2019 1:44 PM   Philmore Pali 01-Dec-1956 [id number]  Referring provider: Steele Sizer, MD [address] [place],  [place] [address]  Chief Complaint  Patient presents with  . Urinary Incontinence    HPI: Was consulted to assess the patient's incontinence worsening over many years.  She leaks with coughing sneezing bending lifting.  She has urge incontinence and leaks when she goes from a sitting to standing position.  She wears 3 pads a day moderately wet and has no bedwetting  She can void every 30 minutes after drinking coffee and has difficulty holding it for 2 hours.  Gets up 3 times a night to urinate.  Flow was good  She has had neck surgery.  She gets occasional bladder infections  She is prone to constipation.  She has had a hysterectomy.  No kidney stone or bladder surgery.  No previous medical treatment  Patient was concerned about her renal health since her sister is on dialysis but her recent GFR was 90 mils per minute  Modifying factors: There are no other modifying factors  Associated signs and symptoms: There are no other associated signs and symptoms Aggravating and relieving factors: There are no other aggravating or relieving factors Severity: Moderate Duration: Persistent   PMH: Past Medical History:  Diagnosis Date  . B12 deficiency 12/27/2017  . Diverticulitis   . Fibromyalgia   . GERD (gastroesophageal reflux disease)   . High cholesterol   . Migraine   . MS (multiple sclerosis) ([place])   . PUD (peptic ulcer disease) 04/28/2018    Surgical History: Past Surgical History:  Procedure Laterality Date  . BACK SURGERY  1991?   Pt usure of date  . BREAST BIOPSY Right   . COLONOSCOPY  09/2007  . COLONOSCOPY WITH PROPOFOL N/A 08/28/2017   Procedure: COLONOSCOPY WITH PROPOFOL;  Surgeon: Christene Lye, MD;  Location: [facility] ENDOSCOPY;  Service: Endoscopy;  Laterality: N/A;  . ESOPHAGOGASTRODUODENOSCOPY (EGD) WITH PROPOFOL N/A  11/24/2018   Procedure: ESOPHAGOGASTRODUODENOSCOPY (EGD) WITH PROPOFOL;  Surgeon: Lin Landsman, MD;  Location: [facility] ENDOSCOPY;  Service: Gastroenterology;  Laterality: N/A;  . Foot sugery Right    3rd toe from the right"    Home Medications:  Allergies as of 11/23/2019      Reactions   Fenofibric Acid    Lipitor [atorvastatin]    Nsaids       Medication List       Accurate as of November 23, 2019  1:44 PM. If you have any questions, ask your nurse or doctor.        Calcium-Magnesium-Vitamin D 300-150-400 MG-MG-UNIT Tabs Take by mouth.   cholecalciferol 25 MCG (1000 UT) tablet Commonly known as: VITAMIN D3 Take 1,000 Units by mouth daily.   ciprofloxacin 250 MG tablet Commonly known as: Cipro Take 1 tablet (250 mg total) by mouth 2 (two) times daily.   Dexilant 60 MG capsule Generic drug: dexlansoprazole TAKE 1 CAPSULE BY MOUTH EVERY DAY   donepezil 5 MG tablet Commonly known as: ARICEPT Take 1 tablet by mouth daily.   DULoxetine 60 MG capsule Commonly known as: CYMBALTA Take 60 mg by mouth daily.   erythromycin ophthalmic ointment   ferrous sulfate 325 (65 FE) MG EC tablet Take 325 mg by mouth daily.   fluticasone 50 MCG/ACT nasal spray Commonly known as: FLONASE SPRAY 1 SPRAY INTO EACH NOSTRIL EVERY DAY   magnesium oxide 400 MG tablet Commonly known as: MAG-OX Take 400 mg by mouth  daily.   meclizine 25 MG tablet Commonly known as: ANTIVERT Take 25 mg by mouth 3 (three) times daily as needed for dizziness.   meloxicam 15 MG tablet Commonly known as: MOBIC TAKE 1 TABLET BY MOUTH EVERY DAY   Muro 128 5 % ophthalmic solution Generic drug: sodium chloride INSTILL 1 DROP IN RIGHT EYE THREE TIMES PER DAY   predniSONE 10 MG (21) Tbpk tablet Commonly known as: STERAPRED UNI-PAK 21 TAB TAKE AND TAPER AS INSTRUCTED WITH FOOD.   pregabalin 50 MG capsule Commonly known as: Lyrica Take 1 capsule (50 mg total) by mouth 3 (three) times daily.    rOPINIRole 0.25 MG tablet Commonly known as: REQUIP Take 1 tablet (0.25 mg total) by mouth at bedtime.   rosuvastatin 20 MG tablet Commonly known as: CRESTOR TAKE 1 TABLET BY MOUTH EVERY DAY   SUMAtriptan 100 MG tablet Commonly known as: IMITREX Take as directed PRN migraines   tiZANidine 2 MG tablet Commonly known as: ZANAFLEX Take 1 tablet (2 mg total) by mouth 3 (three) times daily.   vitamin B-12 1000 MCG tablet Commonly known as: CYANOCOBALAMIN Take 1,000 mcg by mouth daily.       Allergies:  Allergies  Allergen Reactions  . Fenofibric Acid   . Lipitor [Atorvastatin]   . Nsaids     Family History: Family History  Problem Relation Age of Onset  . Heart disease Mother   . Diabetes Mother   . Kidney disease Mother   . Hypertension Mother   . Heart disease Father   . Hyperlipidemia Father   . Hypertension Father   . Heart disease Other   . Cancer Other        Breast  . Diabetes Other   . Hyperlipidemia Sister   . Hypertension Sister   . Kidney disease Sister   . Diabetes Sister     Social History:  reports that she has never smoked. She has never used smokeless tobacco. She reports that she does not drink alcohol or use drugs.  ROS: UROLOGY Frequent Urination?: Yes Hard to postpone urination?: Yes Burning/pain with urination?: No Get up at night to urinate?: No Leakage of urine?: Yes Urine stream starts and stops?: No Trouble starting stream?: No Do you have to strain to urinate?: No Blood in urine?: No Urinary tract infection?: No Sexually transmitted disease?: No Injury to kidneys or bladder?: No Painful intercourse?: No Weak stream?: No Currently pregnant?: No Vaginal bleeding?: No Last menstrual period?: N  Gastrointestinal Nausea?: No Vomiting?: No Indigestion/heartburn?: No Diarrhea?: No Constipation?: No  Constitutional Fever: No Night sweats?: No Weight loss?: No Fatigue?: No  Skin Skin rash/lesions?: No Itching?: No   Eyes Blurred vision?: No Double vision?: No  Ears/Nose/Throat Sore throat?: No Sinus problems?: No  Hematologic/Lymphatic Swollen glands?: No Easy bruising?: No  Cardiovascular Leg swelling?: No Chest pain?: No  Respiratory Cough?: No Shortness of breath?: No  Endocrine Excessive thirst?: No  Musculoskeletal Back pain?: Yes Joint pain?: No  Neurological Headaches?: No Dizziness?: No  Psychologic Depression?: No Anxiety?: No  Physical Exam: BP (!) 162/68   Pulse 68   Ht 5\' 8"  (1.727 m)   Wt 81.2 kg   BMI 27.22 kg/m   Constitutional:  Alert and oriented, No acute distress. HEENT: [place] AT, moist mucus membranes.  Trachea midline, no masses. Cardiovascular: No clubbing, cyanosis, or edema. Respiratory: Normal respiratory effort, no increased work of breathing. GI: Abdomen is soft, nontender, nondistended, no abdominal masses GU: Mild grade 2  hypermobility of the bladder neck and negative cough test and no prolapse Skin: No rashes, bruises or suspicious lesions. Lymph: No cervical or inguinal adenopathy. Neurologic: Grossly intact, no focal deficits, moving all 4 extremities. Psychiatric: Normal mood and affect.  Laboratory Data: Lab Results  Component Value Date   WBC 4.0 10/23/2019   HGB 12.5 10/23/2019   HCT 38.9 10/23/2019   MCV 84.2 10/23/2019   PLT 198 10/23/2019    Lab Results  Component Value Date   CREATININE 0.72 10/23/2019    No results found for: PSA  No results found for: TESTOSTERONE  Lab Results  Component Value Date   HGBA1C 6.0 (H) 10/23/2019    Urinalysis    Component Value Date/Time   COLORURINE Yellow 07/12/2014 2239   APPEARANCEUR Clear 05/07/2016 1522   LABSPEC 1.011 07/12/2014 2239   PHURINE 7.0 07/12/2014 2239   GLUCOSEU Negative 05/07/2016 1522   GLUCOSEU Negative 07/12/2014 2239   HGBUR 1+ 07/12/2014 2239   BILIRUBINUR neg 10/23/2019 1013   BILIRUBINUR Negative 05/07/2016 1522   BILIRUBINUR Negative  07/12/2014 2239   KETONESUR Negative 07/12/2014 2239   PROTEINUR Negative 10/23/2019 1013   PROTEINUR Negative 05/07/2016 1522   PROTEINUR Negative 07/12/2014 2239   UROBILINOGEN 0.2 10/23/2019 1013   NITRITE neg 10/23/2019 1013   NITRITE Negative 05/07/2016 1522   NITRITE Negative 07/12/2014 2239   LEUKOCYTESUR Moderate (2+) (A) 10/23/2019 1013   LEUKOCYTESUR 2+ (A) 05/07/2016 1522   LEUKOCYTESUR Negative 07/12/2014 2239    Pertinent Imaging:   Assessment & Plan: Patient has mixed incontinence.  She has significant frequency to moderate nocturia.  Role of urodynamics discussed.  Clarified the patient that her kidney function was good and this was her main concern.  She does not want urodynamics but was willing to try at least some medications.  I will reassess in 6 weeks on Myrbetriq 50 mg samples and prescription.  He is hoping delete last but also wanted reassurance about her kidney function  1. Urinary incontinence, unspecified type  - Urinalysis, Complete   No follow-ups on file.  Reece Packer, MD  [facility] [address] [place], [place] [address] [phone]

## 2019-11-30 ENCOUNTER — Other Ambulatory Visit: Payer: Self-pay | Admitting: Family Medicine

## 2019-11-30 DIAGNOSIS — Z1231 Encounter for screening mammogram for malignant neoplasm of breast: Secondary | ICD-10-CM

## 2019-12-02 ENCOUNTER — Telehealth: Payer: Self-pay

## 2019-12-02 NOTE — Telephone Encounter (Signed)
Copied from [id number] [phone]. Topic: Appointment Scheduling - Scheduling Inquiry for Clinic >> Dec 02, 2019  7:45 AM Ashlee Richardson wrote: Reason for CRM: pt is having to urinating a lot and has a foul urine odor and would like to see dr Ancil Boozer today. There is no opening with any provider today. Pt saw the last time she went to urgent care was told to see her PCP

## 2019-12-02 NOTE — Telephone Encounter (Signed)
lvm for scheduling °

## 2019-12-04 ENCOUNTER — Encounter: Payer: Self-pay | Admitting: Family Medicine

## 2019-12-04 ENCOUNTER — Ambulatory Visit (INDEPENDENT_AMBULATORY_CARE_PROVIDER_SITE_OTHER): Payer: Medicare Other | Admitting: Family Medicine

## 2019-12-04 ENCOUNTER — Other Ambulatory Visit: Payer: Self-pay

## 2019-12-04 VITALS — BP 170/70 | HR 70 | Temp 97.1°F | Resp 16 | Ht 68.0 in | Wt 177.7 lb

## 2019-12-04 DIAGNOSIS — K449 Diaphragmatic hernia without obstruction or gangrene: Secondary | ICD-10-CM | POA: Diagnosis not present

## 2019-12-04 DIAGNOSIS — I209 Angina pectoris, unspecified: Secondary | ICD-10-CM

## 2019-12-04 DIAGNOSIS — F4321 Adjustment disorder with depressed mood: Secondary | ICD-10-CM

## 2019-12-04 DIAGNOSIS — R350 Frequency of micturition: Secondary | ICD-10-CM

## 2019-12-04 DIAGNOSIS — F5104 Psychophysiologic insomnia: Secondary | ICD-10-CM

## 2019-12-04 LAB — POCT URINALYSIS DIPSTICK
Appearance: NORMAL
Bilirubin, UA: NEGATIVE
Glucose, UA: NEGATIVE
Nitrite, UA: NEGATIVE
Odor: NORMAL
Protein, UA: NEGATIVE
Spec Grav, UA: >=1.03 — AB (ref 1.010–1.025)
Urobilinogen, UA: 0.2 E.U./dL
pH, UA: 6 (ref 5.0–8.0)

## 2019-12-04 MED ORDER — TRAZODONE HCL 50 MG PO TABS: 25.0000 mg | ORAL_TABLET | Freq: Every evening | ORAL | 3 refills | Status: DC | PRN

## 2019-12-04 NOTE — Progress Notes (Signed)
Name: Ashlee Richardson   MRN: [id number]    DOB: Jun 30, 1956   Date:12/04/2019       Progress Note  Subjective  Chief Complaint  Chief Complaint  Patient presents with  . Urinary Tract Infection    Frequent urination and slight burning when urination onset for a month    HPI  Urinary frequency: she was referred to Urologist , seen by Dr. Matilde Sprang and was advised to have further testing in [place], gave her Myrbetriq, she states mild improvement of symptoms, she refuses surgery. She is still wearing pads, she wants to be checked for UTI again, last urine culture was negative. She has noticed very mild dysuria intermittent for the past week, no hematuria or fever, chills. Urine was dark and had an odor last week but resolved since.   Angina/Indigestion : seen by Dr. Clayborn Bigness, and Dr.Vanga, she has mild hiatal hernia, unremarkable stress test and echo. She is taking medications, still has daily epigastric pain with indigestion. Continue medications  FINDINGS:  Regional wall motion: reveals normal myocardial thickening and wall   motion.  The overall quality of the study is good.   Artifacts noted: no  Left ventricular cavity: normal.    Perfusion Analysis: SPECT images demonstrate homogeneous tracer   distribution throughout the myocardium. Defect type: Normal   Grieving: grand-daughter died in a car accident , she left two children behind. She is still grieving, advised to seek grieve counseling through hospice but she states she can grieve on her own . She is not sleeping well, waking up feeling tired  Elevated bp : improved with rest, not sleeping well, no palpitation.   Patient Active Problem List   Diagnosis Date Noted  . History of lumbar fusion 11/22/2019  . Pre-diabetes 10/26/2019  . GERD (gastroesophageal reflux disease) 04/28/2018  . Pure hypercholesterolemia 04/28/2018  . Diverticulosis 04/28/2018  . RLS (restless legs syndrome) 04/28/2018  . Angina  pectoris ([place]) 04/28/2018  . Vitamin D deficiency 12/27/2017  . Chronic fatigue, unspecified 12/27/2017  . Intractable migraine without aura and without status migrainosus 10/17/2015  . DDD (degenerative disc disease), cervical 05/23/2015  . Bilateral occipital neuralgia 05/23/2015  . DDD (degenerative disc disease), lumbar 05/23/2015  . Multiple sclerosis ([place]) 05/23/2015  . Cervical disc disorder with radiculopathy of cervical region 04/20/2015  . Right arm numbness 04/20/2015  . Right arm weakness 04/20/2015    Social History   Tobacco Use  . Smoking status: Never Smoker  . Smokeless tobacco: Never Used  . Tobacco comment: smoking cessation materials not required  Substance Use Topics  . Alcohol use: No    Alcohol/week: 0.0 standard drinks     Current Outpatient Medications:  .  Calcium-Magnesium-Vitamin D 300-150-400 MG-MG-UNIT TABS, Take by mouth., Disp: , Rfl:  .  cholecalciferol (VITAMIN D3) 25 MCG (1000 UT) tablet, Take 1,000 Units by mouth daily., Disp: , Rfl:  .  DEXILANT 60 MG capsule, TAKE 1 CAPSULE BY MOUTH EVERY DAY, Disp: 90 capsule, Rfl: 0 .  donepezil (ARICEPT) 5 MG tablet, Take 1 tablet by mouth daily., Disp: , Rfl:  .  DULoxetine (CYMBALTA) 60 MG capsule, Take 60 mg by mouth daily., Disp: , Rfl:  .  erythromycin ophthalmic ointment, , Disp: , Rfl:  .  ferrous sulfate 325 (65 FE) MG EC tablet, Take 325 mg by mouth daily., Disp: , Rfl:  .  fluticasone (FLONASE) 50 MCG/ACT nasal spray, SPRAY 1 SPRAY INTO EACH NOSTRIL EVERY DAY, Disp: , Rfl: 0 .  magnesium oxide (MAG-OX) 400 MG tablet, Take 400 mg by mouth daily., Disp: , Rfl:  .  meclizine (ANTIVERT) 25 MG tablet, Take 25 mg by mouth 3 (three) times daily as needed for dizziness., Disp: , Rfl:  .  meloxicam (MOBIC) 15 MG tablet, TAKE 1 TABLET BY MOUTH EVERY DAY, Disp: 90 tablet, Rfl: 0 .  mirabegron ER (MYRBETRIQ) 50 MG TB24 tablet, Take 1 tablet (50 mg total) by mouth daily., Disp: 30 tablet, Rfl: 11 .  MURO 128  5 % ophthalmic solution, INSTILL 1 DROP IN RIGHT EYE THREE TIMES PER DAY, Disp: , Rfl:  .  pregabalin (LYRICA) 50 MG capsule, Take 1 capsule (50 mg total) by mouth 3 (three) times daily., Disp: 90 capsule, Rfl: 0 .  rOPINIRole (REQUIP) 0.25 MG tablet, Take 1 tablet (0.25 mg total) by mouth at bedtime., Disp: 30 tablet, Rfl: 5 .  rosuvastatin (CRESTOR) 20 MG tablet, TAKE 1 TABLET BY MOUTH EVERY DAY, Disp: 90 tablet, Rfl: 0 .  SUMAtriptan (IMITREX) 100 MG tablet, Take as directed PRN migraines, Disp: , Rfl:  .  tiZANidine (ZANAFLEX) 2 MG tablet, Take 1 tablet (2 mg total) by mouth 3 (three) times daily., Disp: 30 tablet, Rfl: 0 .  vitamin B-12 (CYANOCOBALAMIN) 1000 MCG tablet, Take 1,000 mcg by mouth daily., Disp: , Rfl:   Allergies  Allergen Reactions  . Fenofibric Acid   . Lipitor [Atorvastatin]   . Nsaids     ROS  Ten systems reviewed and is negative except as mentioned in HPI    Objective  Vitals:   12/04/19 1418 12/04/19 1424  BP: 140/60 (!) 170/70  Pulse: 70   Resp: 16   Temp: (!) 97.1 F (36.2 C)   SpO2: 98%   Weight: 177 lb 11.2 oz (80.6 kg)   Height: 5\' 8"  (1.727 m)     Body mass index is 27.02 kg/m.    Physical Exam  Constitutional: Patient appears well-developed and well-nourished. Obese  No distress.  HEENT: head atraumatic, normocephalic, pupils equal and reactive to light Cardiovascular: Normal rate, regular rhythm and normal heart sounds.  No murmur heard. No BLE edema. Pulmonary/Chest: Effort normal and breath sounds normal. No respiratory distress. Abdominal: Soft.  There is epigastric tenderness. Negative CVA  Psychiatric: Patient has a normal mood and affect. behavior is normal. Judgment and thought content normal.  Recent Results (from the past 2160 hour(s))  Urine Culture     Status: None   Collection Time: 10/23/19 12:00 AM   Specimen: Urine  Result Value Ref Range   MICRO NUMBER: [id number]    SPECIMEN QUALITY: Adequate    Sample Source URINE     STATUS: FINAL    Result:      Less than 10,000 CFU/mL of single Gram positive organism isolated. No further testing will be performed. If clinically indicated, recollection using a method to minimize contamination, with prompt transfer to Urine Culture Transport Tube, is recommended.  Lipid panel     Status: Abnormal   Collection Time: 10/23/19 12:00 AM  Result Value Ref Range   Cholesterol 201 (H) <200 mg/dL   HDL 68 > OR = 50 mg/dL   Triglycerides 115 <150 mg/dL   LDL Cholesterol (Calc) 110 (H) mg/dL (calc)    Comment: Reference range: <100 . Desirable range <100 mg/dL for primary prevention;   <70 mg/dL for patients with CHD or diabetic patients  with > or = 2 CHD risk factors. Marland Kitchen LDL-C is now calculated using the Martin-Hopkins  calculation, which is a validated novel method providing  better accuracy than the Friedewald equation in the  estimation of LDL-C.  Cresenciano Genre et al. Annamaria Helling. [id number]): [id number]  (http://education.[facility].com/faq/FAQ164)    Total CHOL/HDL Ratio 3.0 <5.0 (calc)   Non-HDL Cholesterol (Calc) 133 (H) <130 mg/dL (calc)    Comment: For patients with diabetes plus 1 major ASCVD risk  factor, treating to a non-HDL-C goal of <100 mg/dL  (LDL-C of <70 mg/dL) is considered a therapeutic  option.   COMPLETE METABOLIC PANEL WITH GFR     Status: Abnormal   Collection Time: 10/23/19 12:00 AM  Result Value Ref Range   Glucose, Bld 105 (H) 65 - 99 mg/dL    Comment: .            Fasting reference interval . For someone without known diabetes, a glucose value between 100 and 125 mg/dL is consistent with prediabetes and should be confirmed with a follow-up test. .    BUN 12 7 - 25 mg/dL   Creat 0.72 0.50 - 0.99 mg/dL    Comment: For patients >23 years of age, the reference limit for Creatinine is approximately 13% higher for people identified as African-American. .    GFR, Est Non African American 90 > OR = 60 mL/min/1.28m2   GFR, Est African  American 104 > OR = 60 mL/min/1.85m2   BUN/Creatinine Ratio NOT APPLICABLE 6 - 22 (calc)   Sodium 143 135 - 146 mmol/L   Potassium 4.1 3.5 - 5.3 mmol/L   Chloride 106 98 - 110 mmol/L   CO2 28 20 - 32 mmol/L   Calcium 9.7 8.6 - 10.4 mg/dL   Total Protein 6.6 6.1 - 8.1 g/dL   Albumin 4.5 3.6 - 5.1 g/dL   Globulin 2.1 1.9 - 3.7 g/dL (calc)   AG Ratio 2.1 1.0 - 2.5 (calc)   Total Bilirubin 0.5 0.2 - 1.2 mg/dL   Alkaline phosphatase (APISO) 55 37 - 153 U/L   AST 14 10 - 35 U/L   ALT 13 6 - 29 U/L  Hemoglobin A1c     Status: Abnormal   Collection Time: 10/23/19 12:00 AM  Result Value Ref Range   Hgb A1c MFr Bld 6.0 (H) <5.7 % of total Hgb    Comment: For someone without known diabetes, a hemoglobin  A1c value between 5.7% and 6.4% is consistent with prediabetes and should be confirmed with a  follow-up test. . For someone with known diabetes, a value <7% indicates that their diabetes is well controlled. A1c targets should be individualized based on duration of diabetes, age, comorbid conditions, and other considerations. . This assay result is consistent with an increased risk of diabetes. . Currently, no consensus exists regarding use of hemoglobin A1c for diagnosis of diabetes for children. .    Mean Plasma Glucose 126 (calc)   eAG (mmol/L) 7.0 (calc)  VITAMIN D 25 Hydroxy (Vit-D Deficiency, Fractures)     Status: None   Collection Time: 10/23/19 12:00 AM  Result Value Ref Range   Vit D, 25-Hydroxy 32 30 - 100 ng/mL    Comment: Vitamin D Status         25-OH Vitamin D: . Deficiency:                    <20 ng/mL Insufficiency:             20 - 29 ng/mL Optimal:                 >  or = 30 ng/mL . For 25-OH Vitamin D testing on patients on  D2-supplementation and patients for whom quantitation  of D2 and D3 fractions is required, the [facility](TM) 25-OH VIT D, (D2,D3), LC/MS/MS is recommended: order  code [phone] (patients >21yrs). See Note 1 . Note 1 . For  additional information, please refer to  http://education.[facility].com/faq/FAQ199  (This link is being provided for informational/ educational purposes only.)   CBC with Differential/Platelet     Status: None   Collection Time: 10/23/19 12:00 AM  Result Value Ref Range   WBC 4.0 3.8 - 10.8 Thousand/uL   RBC 4.62 3.80 - 5.10 Million/uL   Hemoglobin 12.5 11.7 - 15.5 g/dL   HCT 38.9 35.0 - 45.0 %   MCV 84.2 80.0 - 100.0 fL   MCH 27.1 27.0 - 33.0 pg   MCHC 32.1 32.0 - 36.0 g/dL   RDW 12.3 11.0 - 15.0 %   Platelets 198 140 - 400 Thousand/uL   MPV 12.4 7.5 - 12.5 fL   Neutro Abs 1,736 1,500 - 7,800 cells/uL   Lymphs Abs 1,792 850 - 3,900 cells/uL   Absolute Monocytes 296 200 - 950 cells/uL   Eosinophils Absolute 108 15 - 500 cells/uL   Basophils Absolute 68 0 - 200 cells/uL   Neutrophils Relative % 43.4 %   Total Lymphocyte 44.8 %   Monocytes Relative 7.4 %   Eosinophils Relative 2.7 %   Basophils Relative 1.7 %  Vitamin B12     Status: None   Collection Time: 10/23/19 12:00 AM  Result Value Ref Range   Vitamin B-12 782 200 - 1,100 pg/mL  POCT urinalysis dipstick     Status: Abnormal   Collection Time: 10/23/19 10:13 AM  Result Value Ref Range   Color, UA amber    Clarity, UA cloudy    Glucose, UA Negative Negative   Bilirubin, UA neg    Ketones, UA neg    Spec Grav, UA 1.015 1.010 - 1.025   Blood, UA neg    pH, UA 6.0 5.0 - 8.0   Protein, UA Negative Negative   Urobilinogen, UA 0.2 0.2 or 1.0 E.U./dL   Nitrite, UA neg    Leukocytes, UA Moderate (2+) (A) Negative   Appearance     Odor    Bladder Scan (Post Void Residual) in office     Status: None   Collection Time: 11/23/19  1:27 PM  Result Value Ref Range   Scan Result 50ML   Urinalysis, Complete     Status: Abnormal   Collection Time: 11/23/19  1:29 PM  Result Value Ref Range   Specific Gravity, UA <1.005 (L) 1.005 - 1.030   pH, UA 6.5 5.0 - 7.5   Color, UA Yellow Yellow   Appearance Ur Clear Clear    Leukocytes,UA Negative Negative   Protein,UA Negative Negative/Trace   Glucose, UA Negative Negative   Ketones, UA Negative Negative   RBC, UA Negative Negative   Bilirubin, UA Negative Negative   Urobilinogen, Ur 0.2 0.2 - 1.0 mg/dL   Nitrite, UA Negative Negative   Microscopic Examination See below:   Microscopic Examination     Status: None   Collection Time: 11/23/19  1:29 PM   URINE  Result Value Ref Range   WBC, UA None seen 0 - 5 /hpf   RBC None seen 0 - 2 /hpf   Epithelial Cells (non renal) 0-10 0 - 10 /hpf   Bacteria, UA Few None seen/Few  POCT urinalysis  dipstick     Status: Abnormal   Collection Time: 12/04/19  2:44 PM  Result Value Ref Range   Color, UA yellow    Clarity, UA clear    Glucose, UA Negative Negative   Bilirubin, UA Neg    Ketones, UA Trace    Spec Grav, UA >=1.030 (A) 1.010 - 1.025   Blood, UA Trace    pH, UA 6.0 5.0 - 8.0   Protein, UA Negative Negative   Urobilinogen, UA 0.2 0.2 or 1.0 E.U./dL   Nitrite, UA neg    Leukocytes, UA Trace (A) Negative   Appearance normal    Odor Normal      Assessment & Plan    1. Frequency of urination  - POCT urinalysis dipstick - CULTURE, URINE COMPREHENSIVE  2. Angina pectoris ([place])  Epigastric pain, not sure if heart or GI related, being treated for both   3. Hiatal hernia  Keep follow up with Dr. Marius Ditch  4. Grieving  Not interested in counseling

## 2019-12-06 LAB — CULTURE, URINE COMPREHENSIVE
MICRO NUMBER:: 1191195
RESULT:: NO GROWTH
SPECIMEN QUALITY:: ADEQUATE

## 2019-12-22 ENCOUNTER — Encounter: Payer: Self-pay | Admitting: Orthopaedic Surgery

## 2019-12-22 ENCOUNTER — Other Ambulatory Visit: Payer: Self-pay

## 2019-12-22 ENCOUNTER — Ambulatory Visit (INDEPENDENT_AMBULATORY_CARE_PROVIDER_SITE_OTHER): Payer: Medicare Other | Admitting: Orthopaedic Surgery

## 2019-12-22 VITALS — BP 144/70 | HR 58 | Ht 68.0 in | Wt 179.0 lb

## 2019-12-22 DIAGNOSIS — M5010 Cervical disc disorder with radiculopathy, unspecified cervical region: Secondary | ICD-10-CM

## 2019-12-22 DIAGNOSIS — M542 Cervicalgia: Secondary | ICD-10-CM

## 2019-12-22 NOTE — Progress Notes (Signed)
Office Visit Note   Patient: Ashlee Richardson           Date of Birth: 1956/02/06           MRN: [id number] Visit Date: 12/22/2019              Requested by: Steele Sizer, [place] [address] [place],  [place] [address] PCP: Steele Sizer, MD   Assessment & Plan: Visit Diagnoses:  1. Neck pain   2. Cervical disc disorder with radiculopathy of cervical region     Plan: We will proceed with cervical MRI scan due to persistent symptoms failed conservative treatment.  He does have previous diagnosis of MS.  Office follow-up after scan for review.  She has had several months of conservative treatment without improvement.  Follow-Up Instructions: No follow-ups on file.   Orders:  Orders Placed This Encounter  Procedures  . MR Cervical Spine w/o contrast   No orders of the defined types were placed in this encounter.     Procedures: No procedures performed   Clinical Data: No additional findings.   Subjective: Chief Complaint  Patient presents with  . Neck - Follow-up, Pain  . Lower Back - Pain, Follow-up    HPI 63 year old female returns with persistent problems with her neck and she states it is "killing me".  Previous lumbar fusion done in [place].  She has been in pain management.  Prednisone Dosepak was used last month without any improvement.  She has persistent pain that radiates from her neck into her left arm she states her hand goes numb when she drives.  She woke up this morning not able to move her hand and took several minutes for the feeling returned.  She does have history of multiple sclerosis.  Patient has been on gabapentin originally then Lyrica.  Chronic Cymbalta.  Anti-inflammatories, muscle relaxants were not effective as well as no relief with prednisone Dosepak.  Previous cervical spine x-ray showed spondylosis with posterior endplate spurring particularly at the C5-6 level.  Patient denies any myelopathic gait problems.  Review of Systems  14 point review of systems positive for vitamin D deficiency with osteopenia.  Possible GERD controlled medications.  History of MS.  Previous lumbar fusion, restless leg syndrome.  Positive for prediabetes.   Objective: Vital Signs: BP (!) 144/70   Pulse (!) 58   Ht 5\' 8"  (1.727 m)   Wt 179 lb (81.2 kg)   BMI 27.22 kg/m   Physical Exam Constitutional:      Appearance: She is well-developed.  HENT:     Head: Normocephalic.     Right Ear: External ear normal.     Left Ear: External ear normal.  Eyes:     Pupils: Pupils are equal, round, and reactive to light.  Neck:     Thyroid: No thyromegaly.     Trachea: No tracheal deviation.  Cardiovascular:     Rate and Rhythm: Normal rate.  Pulmonary:     Effort: Pulmonary effort is normal.  Abdominal:     Palpations: Abdomen is soft.  Skin:    General: Skin is warm and dry.  Neurological:     Mental Status: She is alert and oriented to person, place, and time.  Psychiatric:        Behavior: Behavior normal.     Ortho Exam well-healed lumbar incision.  Upper extremity reflexes are 1+ and symmetrical.  Negative impingement right and left shoulder.  Significant brachial plexus tenderness on  the left mild on the right.  No lower extremity clonus.  Specialty Comments:  No specialty comments available.  Imaging: Cervical x-rays 11/17/2019 showed multilevel spondylosis most pronounced at C5-6 with anterior posterior spurring from C4-C7 without listhesis.   PMFS History: Patient Active Problem List   Diagnosis Date Noted  . History of lumbar fusion 11/22/2019  . Pre-diabetes 10/26/2019  . GERD (gastroesophageal reflux disease) 04/28/2018  . Pure hypercholesterolemia 04/28/2018  . Diverticulosis 04/28/2018  . RLS (restless legs syndrome) 04/28/2018  . Angina pectoris ([place]) 04/28/2018  . Vitamin D deficiency 12/27/2017  . Chronic fatigue, unspecified 12/27/2017  . Intractable migraine without aura and without status migrainosus  10/17/2015  . DDD (degenerative disc disease), cervical 05/23/2015  . Bilateral occipital neuralgia 05/23/2015  . DDD (degenerative disc disease), lumbar 05/23/2015  . Multiple sclerosis ([place]) 05/23/2015  . Cervical disc disorder with radiculopathy of cervical region 04/20/2015  . Right arm numbness 04/20/2015  . Right arm weakness 04/20/2015   Past Medical History:  Diagnosis Date  . B12 deficiency 12/27/2017  . Diverticulitis   . Fibromyalgia   . GERD (gastroesophageal reflux disease)   . High cholesterol   . Migraine   . MS (multiple sclerosis) ([place])   . PUD (peptic ulcer disease) 04/28/2018    Family History  Problem Relation Age of Onset  . Heart disease Mother   . Diabetes Mother   . Kidney disease Mother   . Hypertension Mother   . Heart disease Father   . Hyperlipidemia Father   . Hypertension Father   . Heart disease Other   . Cancer Other        Breast  . Diabetes Other   . Hyperlipidemia Sister   . Hypertension Sister   . Kidney disease Sister   . Diabetes Sister     Past Surgical History:  Procedure Laterality Date  . BACK SURGERY  1991?   Pt usure of date  . BREAST BIOPSY Right   . COLONOSCOPY  09/2007  . COLONOSCOPY WITH PROPOFOL N/A 08/28/2017   Procedure: COLONOSCOPY WITH PROPOFOL;  Surgeon: Christene Lye, MD;  Location: [facility] ENDOSCOPY;  Service: Endoscopy;  Laterality: N/A;  . ESOPHAGOGASTRODUODENOSCOPY (EGD) WITH PROPOFOL N/A 11/24/2018   Procedure: ESOPHAGOGASTRODUODENOSCOPY (EGD) WITH PROPOFOL;  Surgeon: Lin Landsman, MD;  Location: [facility] ENDOSCOPY;  Service: Gastroenterology;  Laterality: N/A;  . Foot sugery Right    3rd toe from the right"   Social History   Occupational History  . 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 Smoking status: Never Smoker  . Smokeless tobacco: Never Used  . Tobacco comment: smoking cessation materials not required    Substance and Sexual Activity  . Alcohol use: No    Alcohol/week: 0.0 standard drinks  . Drug use: No  . Sexual activity: Yes    Birth control/protection: Post-menopausal

## 2019-12-26 ENCOUNTER — Other Ambulatory Visit: Payer: Self-pay | Admitting: Family Medicine

## 2019-12-26 DIAGNOSIS — F5104 Psychophysiologic insomnia: Secondary | ICD-10-CM

## 2019-12-27 ENCOUNTER — Ambulatory Visit
Admission: RE | Admit: 2019-12-27 | Discharge: 2019-12-27 | Disposition: A | Discharge status: Home / Self Care | Payer: Medicare Other | Source: Ambulatory Visit | Attending: Orthopaedic Surgery | Admitting: Orthopaedic Surgery

## 2019-12-27 ENCOUNTER — Other Ambulatory Visit: Payer: Self-pay

## 2019-12-27 DIAGNOSIS — M542 Cervicalgia: Secondary | ICD-10-CM | POA: Diagnosis not present

## 2019-12-29 ENCOUNTER — Ambulatory Visit (INDEPENDENT_AMBULATORY_CARE_PROVIDER_SITE_OTHER): Payer: Medicare Other | Admitting: Orthopaedic Surgery

## 2019-12-29 ENCOUNTER — Encounter: Payer: Self-pay | Admitting: Orthopaedic Surgery

## 2019-12-29 ENCOUNTER — Other Ambulatory Visit: Payer: Self-pay | Admitting: Family Medicine

## 2019-12-29 ENCOUNTER — Other Ambulatory Visit: Payer: Self-pay

## 2019-12-29 VITALS — Ht 68.0 in | Wt 179.0 lb

## 2019-12-29 DIAGNOSIS — M542 Cervicalgia: Secondary | ICD-10-CM

## 2019-12-29 DIAGNOSIS — M5030 Other cervical disc degeneration, unspecified cervical region: Secondary | ICD-10-CM | POA: Diagnosis not present

## 2019-12-29 DIAGNOSIS — M5010 Cervical disc disorder with radiculopathy, unspecified cervical region: Secondary | ICD-10-CM | POA: Diagnosis not present

## 2019-12-29 NOTE — Progress Notes (Signed)
Office Visit Note   Patient: Ashlee Richardson           Date of Birth: Nov 09, 1956           MRN: [id number] Visit Date: 12/29/2019              Requested by: Steele Sizer, [address] [place],  [place] [address] PCP: Steele Sizer, MD   Assessment & Plan: Visit Diagnoses:  1. Neck pain   2. DDD (degenerative disc disease), cervical   3. Cervical disc disorder with radiculopathy of cervical region     Plan: We reviewed MRI scan.  Spondylosis and she has effacement touching the cord but no severe cord compression.  She does have moderate foraminal narrowing.  We will set her up for some physical therapy I will recheck her again in 5 to 6 weeks.  We discussed home cervical traction but states she did not want to use it.  Follow-Up Instructions: Return in about 5 weeks (around 02/02/2020).   Orders:  Orders Placed This Encounter  Procedures  . Ambulatory referral to Physical Therapy   No orders of the defined types were placed in this encounter.     Procedures: No procedures performed   Clinical Data: No additional findings.   Subjective: Chief Complaint  Patient presents with  . Neck - Pain, Follow-up    MRI Review    HPI  64 year old female returns with ongoing problems with cervical spondylosis she states "my neck is killing me".  Cervical MRI scan shows spondylosis at C4-5 and C5-6 primarily without cord compression or cord abnormality.  She does have mild contact on the ventral spinal cord at C4-5 and moderate by foraminal narrowing C5-6 and C4-5.  Patient's been in pain management.  Previous lumbar fusion done in [facility].  No myelopathic symptoms. Review of Systems 14 point systems update unchanged from 12/22/2019 office visit.  Of note is her history of multiple sclerosis.  Treated with gabapentin and Lyrica in the past.  Currently on Cymbalta.  Positive for prediabetes.     Objective: Vital Signs: Ht 5\' 8"  (1.727 m)   Wt  179 lb (81.2 kg)   BMI 27.22 kg/m   Physical Exam Constitutional:      Appearance: She is well-developed.  HENT:     Head: Normocephalic.     Right Ear: External ear normal.     Left Ear: External ear normal.  Eyes:     Pupils: Pupils are equal, round, and reactive to light.  Neck:     Thyroid: No thyromegaly.     Trachea: No tracheal deviation.  Cardiovascular:     Rate and Rhythm: Normal rate.  Pulmonary:     Effort: Pulmonary effort is normal.  Abdominal:     Palpations: Abdomen is soft.  Skin:    General: Skin is warm and dry.  Neurological:     Mental Status: She is alert and oriented to person, place, and time.  Psychiatric:        Behavior: Behavior normal.     Ortho Exam well-healed lumbar incision upper extremity reflexes are symmetrical and 1+.  No isolated motor weakness to upper extremities.  She does have significant brachial plexus tenderness both right and left.  No lower extremity clonus normal heel toe gait. Patient did get relief with cervical distraction manually. Specialty Comments:  No specialty comments available.  Imaging: No results found. CLINICAL DATA:  Neck pain. Spondylosis C5-C6 with a  left arm pain. Additional history provided: Left-sided neck pain for 1 year, worsening.  EXAM: MRI CERVICAL SPINE WITHOUT CONTRAST  TECHNIQUE: Multiplanar, multisequence MR imaging of the cervical spine was performed. No intravenous contrast was administered.  COMPARISON:  Cervical spine radiographs 11/17/2019, cervical spine MRI 05/08/2015  FINDINGS: Alignment: Reversal of the expected cervical lordosis centered at C4-C5. Trace C4-C5 grade 1 retrolisthesis. 2 mm C5-C6 grade 1 retrolisthesis.  Vertebrae: Vertebral body height is maintained. No marrow edema or suspicious osseous lesion. Degenerative endplate irregularity and fatty degenerative endplate marrow signal at the C4-C5 through C6-C7 levels.  Cord: No spinal cord signal  abnormality.  Posterior Fossa, vertebral arteries, paraspinal tissues: No abnormality identified within included portions of the posterior fossa. Paraspinal soft tissues within normal limits. Preserved flow voids within the imaged cervical vertebral arteries.  Disc levels:  Unless otherwise stated, the level by level findings below have not significantly changed since prior MRI 11/17/2019.  Moderate C5-C6 and C6-C7 disc degeneration. Mild disc degeneration at the remaining levels.  C2-C3: Mild facet hypertrophy on the left. No disc herniation. No significant canal or foraminal stenosis.  C3-C4: Mild facet hypertrophy. No disc herniation. No significant canal or foraminal stenosis.  C4-C5: Shallow disc bulge with bilateral disc osteophyte ridge/uncinate hypertrophy. Mild facet hypertrophy. As before, there is mild effacement of the ventral thecal sac with possible contact upon the ventral spinal cord, but no significant central canal stenosis. Mild bilateral neural foraminal narrowing.  C5-C6: Posterior disc osteophyte complex. Uncinate/facet hypertrophy. Mild effacement of the ventral thecal sac without significant central canal stenosis. Moderate bilateral neural foraminal narrowing (greater on the left).  C6-C7: Posterior disc osteophyte complex. Uncinate/facet hypertrophy. Mild effacement of the ventral thecal sac without significant central canal stenosis. Moderate/advanced right with mild left neural foraminal narrowing.  C7-T1: No disc herniation. No significant canal or foraminal stenosis.  IMPRESSION: Cervical spondylosis has not significantly changed since prior MRI 05/08/2015  Disc bulges and posterior disc osteophytes mildly efface the ventral thecal sac at several levels, and there may be mild contact upon the ventral spinal cord at C4-C5. However, no significant central canal stenosis at any level.  Multilevel neural foraminal narrowing  greatest bilaterally at C5-C6 (moderate) and on the right at C6-C7 (moderate/severe).   Electronically Signed   By: Kellie Simmering DO   On: 12/27/2019 13:49   PMFS History: Patient Active Problem List   Diagnosis Date Noted  . History of lumbar fusion 11/22/2019  . Pre-diabetes 10/26/2019  . GERD (gastroesophageal reflux disease) 04/28/2018  . Pure hypercholesterolemia 04/28/2018  . Diverticulosis 04/28/2018  . RLS (restless legs syndrome) 04/28/2018  . Angina pectoris ([place]) 04/28/2018  . Vitamin D deficiency 12/27/2017  . Chronic fatigue, unspecified 12/27/2017  . Intractable migraine without aura and without status migrainosus 10/17/2015  . DDD (degenerative disc disease), cervical 05/23/2015  . Bilateral occipital neuralgia 05/23/2015  . DDD (degenerative disc disease), lumbar 05/23/2015  . Multiple sclerosis ([place]) 05/23/2015  . Cervical disc disorder with radiculopathy of cervical region 04/20/2015  . Right arm numbness 04/20/2015  . Right arm weakness 04/20/2015   Past Medical History:  Diagnosis Date  . B12 deficiency 12/27/2017  . Diverticulitis   . Fibromyalgia   . GERD (gastroesophageal reflux disease)   . High cholesterol   . Migraine   . MS (multiple sclerosis) ([place])   . PUD (peptic ulcer disease) 04/28/2018    Family History  Problem Relation Age of Onset  . Heart disease Mother   . Diabetes  Mother   . Kidney disease Mother   . Hypertension Mother   . Heart disease Father   . Hyperlipidemia Father   . Hypertension Father   . Heart disease Other   . Cancer Other        Breast  . Diabetes Other   . Hyperlipidemia Sister   . Hypertension Sister   . Kidney disease Sister   . Diabetes Sister     Past Surgical History:  Procedure Laterality Date  . BACK SURGERY  1991?   Pt usure of date  . BREAST BIOPSY Right   . COLONOSCOPY  09/2007  . COLONOSCOPY WITH PROPOFOL N/A 08/28/2017   Procedure: COLONOSCOPY WITH PROPOFOL;  Surgeon: Christene Lye,  MD;  Location: [facility] ENDOSCOPY;  Service: Endoscopy;  Laterality: N/A;  . ESOPHAGOGASTRODUODENOSCOPY (EGD) WITH PROPOFOL N/A 11/24/2018   Procedure: ESOPHAGOGASTRODUODENOSCOPY (EGD) WITH PROPOFOL;  Surgeon: Lin Landsman, MD;  Location: [facility] ENDOSCOPY;  Service: Gastroenterology;  Laterality: N/A;  . Foot sugery Right    3rd toe from the right"   Social History   Occupational History  . 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 Smoking status: Never Smoker  . Smokeless tobacco: Never Used  . Tobacco comment: smoking cessation materials not required  Substance and Sexual Activity  . Alcohol use: No    Alcohol/week: 0.0 standard drinks  . Drug use: No  . Sexual activity: Yes    Birth control/protection: Post-menopausal

## 2020-01-04 ENCOUNTER — Other Ambulatory Visit: Payer: Self-pay

## 2020-01-04 ENCOUNTER — Ambulatory Visit (INDEPENDENT_AMBULATORY_CARE_PROVIDER_SITE_OTHER): Payer: Medicare Other | Admitting: Urology

## 2020-01-04 ENCOUNTER — Encounter: Payer: Self-pay | Admitting: Urology

## 2020-01-04 VITALS — BP 143/80 | HR 65 | Ht 62.0 in | Wt 179.0 lb

## 2020-01-04 DIAGNOSIS — N3946 Mixed incontinence: Secondary | ICD-10-CM | POA: Diagnosis not present

## 2020-01-04 MED ORDER — MIRABEGRON ER 50 MG PO TB24: 50.0000 mg | ORAL_TABLET | Freq: Every day | ORAL | 11 refills | Status: DC

## 2020-01-04 NOTE — Progress Notes (Signed)
01/04/2020 2:49 PM   Ashlee Richardson 1956-09-04 [id number]  Referring provider: Steele Sizer, MD [address] [place],  [place] [address]  Chief Complaint  Patient presents with  . Urinary Incontinence    HPI: Was consulted to assess the patient's incontinence worsening over many years.  She leaks with coughing sneezing bending lifting.  She has urge incontinence and leaks when she goes from a sitting to standing position.  She wears 3 pads a day moderately wet and has no bedwetting  She can void every 30 minutes after drinking coffee and has difficulty holding it for 2 hours.  Gets up 3 times a night to urinate.    Mild grade 2 hypermobility of the bladder neck and negative cough test and no prolapse  Patient has mixed incontinence.  She has significant frequency to moderate nocturia.  Role of urodynamics discussed.  Clarified the patient that her kidney function was good and this was her main concern.  She does not want urodynamics but was willing to try at least some medications.  I will reassess in 6 weeks on Myrbetriq 50 mg samples and prescription.   Today Urgency incontinence better.  Frequency less.  Please.   MH: Past Medical History:  Diagnosis Date  . B12 deficiency 12/27/2017  . Diverticulitis   . Fibromyalgia   . GERD (gastroesophageal reflux disease)   . High cholesterol   . Migraine   . MS (multiple sclerosis) ([place])   . PUD (peptic ulcer disease) 04/28/2018    Surgical History: Past Surgical History:  Procedure Laterality Date  . BACK SURGERY  1991?   Pt usure of date  . BREAST BIOPSY Right   . COLONOSCOPY  09/2007  . COLONOSCOPY WITH PROPOFOL N/A 08/28/2017   Procedure: COLONOSCOPY WITH PROPOFOL;  Surgeon: Christene Lye, MD;  Location: [facility] ENDOSCOPY;  Service: Endoscopy;  Laterality: N/A;  . ESOPHAGOGASTRODUODENOSCOPY (EGD) WITH PROPOFOL N/A 11/24/2018   Procedure: ESOPHAGOGASTRODUODENOSCOPY (EGD) WITH PROPOFOL;  Surgeon:  Lin Landsman, MD;  Location: [facility] ENDOSCOPY;  Service: Gastroenterology;  Laterality: N/A;  . Foot sugery Right    3rd toe from the right"    Home Medications:  Allergies as of 01/04/2020      Reactions   Fenofibric Acid    Lipitor [atorvastatin]    Nsaids       Medication List       Accurate as of January 04, 2020  2:49 PM. If you have any questions, ask your nurse or doctor.        Calcium-Magnesium-Vitamin D 300-150-400 MG-MG-UNIT Tabs Take by mouth.   cholecalciferol 25 MCG (1000 UNIT) tablet Commonly known as: VITAMIN D3 Take 1,000 Units by mouth daily.   Dexilant 60 MG capsule Generic drug: dexlansoprazole TAKE 1 CAPSULE BY MOUTH EVERY DAY   donepezil 5 MG tablet Commonly known as: ARICEPT Take 1 tablet by mouth daily.   DULoxetine 60 MG capsule Commonly known as: CYMBALTA Take 60 mg by mouth daily.   erythromycin ophthalmic ointment   ferrous sulfate 325 (65 FE) MG EC tablet Take 325 mg by mouth daily.   fluticasone 50 MCG/ACT nasal spray Commonly known as: FLONASE SPRAY 1 SPRAY INTO EACH NOSTRIL EVERY DAY   magnesium oxide 400 MG tablet Commonly known as: MAG-OX Take 400 mg by mouth daily.   meclizine 25 MG tablet Commonly known as: ANTIVERT Take 25 mg by mouth 3 (three) times daily as needed for dizziness.   meloxicam 15 MG tablet Commonly  known as: MOBIC TAKE 1 TABLET BY MOUTH EVERY DAY   mirabegron ER 50 MG Tb24 tablet Commonly known as: MYRBETRIQ Take 1 tablet (50 mg total) by mouth daily.   Muro 128 5 % ophthalmic solution Generic drug: sodium chloride INSTILL 1 DROP IN RIGHT EYE THREE TIMES PER DAY   pregabalin 50 MG capsule Commonly known as: Lyrica Take 1 capsule (50 mg total) by mouth 3 (three) times daily.   rOPINIRole 0.25 MG tablet Commonly known as: REQUIP Take 1 tablet (0.25 mg total) by mouth at bedtime.   rosuvastatin 20 MG tablet Commonly known as: CRESTOR TAKE 1 TABLET BY MOUTH EVERY DAY   SUMAtriptan 100  MG tablet Commonly known as: IMITREX Take as directed PRN migraines   tiZANidine 2 MG tablet Commonly known as: ZANAFLEX Take 1 tablet (2 mg total) by mouth 3 (three) times daily.   traZODone 50 MG tablet Commonly known as: DESYREL TAKE 1/2 TO 1 TABLET BY MOUTH AT BEDTIME AS NEEDED FOR SLEEP.   vitamin B-12 1000 MCG tablet Commonly known as: CYANOCOBALAMIN Take 1,000 mcg by mouth daily.       Allergies:  Allergies  Allergen Reactions  . Fenofibric Acid   . Lipitor [Atorvastatin]   . Nsaids     Family History: Family History  Problem Relation Age of Onset  . Heart disease Mother   . Diabetes Mother   . Kidney disease Mother   . Hypertension Mother   . Heart disease Father   . Hyperlipidemia Father   . Hypertension Father   . Heart disease Other   . Cancer Other        Breast  . Diabetes Other   . Hyperlipidemia Sister   . Hypertension Sister   . Kidney disease Sister   . Diabetes Sister     Social History:  reports that she has never smoked. She has never used smokeless tobacco. She reports that she does not drink alcohol or use drugs.  ROS: UROLOGY Frequent Urination?: No Hard to postpone urination?: Yes Burning/pain with urination?: No Get up at night to urinate?: No Leakage of urine?: Yes Urine stream starts and stops?: No Trouble starting stream?: No Do you have to strain to urinate?: No Blood in urine?: No Urinary tract infection?: No Sexually transmitted disease?: No Injury to kidneys or bladder?: No Painful intercourse?: No Weak stream?: No Currently pregnant?: No Vaginal bleeding?: No Last menstrual period?: n  Gastrointestinal Nausea?: No Vomiting?: No Indigestion/heartburn?: No Diarrhea?: No Constipation?: No  Constitutional Fever: No Night sweats?: No Weight loss?: No Fatigue?: No  Skin Skin rash/lesions?: No Itching?: No  Eyes Blurred vision?: No Double vision?: No  Ears/Nose/Throat Sore throat?: No Sinus  problems?: No  Hematologic/Lymphatic Swollen glands?: No Easy bruising?: No  Cardiovascular Leg swelling?: No Chest pain?: No  Respiratory Cough?: No Shortness of breath?: No  Endocrine Excessive thirst?: No  Musculoskeletal Back pain?: Yes Joint pain?: Yes  Neurological Headaches?: No Dizziness?: No  Psychologic Depression?: No Anxiety?: No  Physical Exam: BP (!) 143/80   Pulse 65   Ht 5\' 2"  (1.575 m)   Wt 179 lb (81.2 kg)   BMI 32.74 kg/m    Laboratory Data: Lab Results  Component Value Date   WBC 4.0 10/23/2019   HGB 12.5 10/23/2019   HCT 38.9 10/23/2019   MCV 84.2 10/23/2019   PLT 198 10/23/2019    Lab Results  Component Value Date   CREATININE 0.72 10/23/2019    No results found  for: PSA  No results found for: TESTOSTERONE  Lab Results  Component Value Date   HGBA1C 6.0 (H) 10/23/2019    Urinalysis    Component Value Date/Time   COLORURINE Yellow 07/12/2014 2239   APPEARANCEUR Clear 11/23/2019 1329   LABSPEC 1.011 07/12/2014 2239   PHURINE 7.0 07/12/2014 2239   GLUCOSEU Negative 11/23/2019 1329   GLUCOSEU Negative 07/12/2014 2239   HGBUR 1+ 07/12/2014 2239   BILIRUBINUR Neg 12/04/2019 1444   BILIRUBINUR Negative 11/23/2019 1329   BILIRUBINUR Negative 07/12/2014 2239   KETONESUR Negative 07/12/2014 2239   PROTEINUR Negative 12/04/2019 1444   PROTEINUR Negative 11/23/2019 1329   PROTEINUR Negative 07/12/2014 2239   UROBILINOGEN 0.2 12/04/2019 1444   NITRITE neg 12/04/2019 1444   NITRITE Negative 11/23/2019 1329   NITRITE Negative 07/12/2014 2239   LEUKOCYTESUR Trace (A) 12/04/2019 1444   LEUKOCYTESUR Negative 11/23/2019 1329   LEUKOCYTESUR Negative 07/12/2014 2239    Pertinent Imaging:   Assessment & Plan: Reassess durability in 4 months  There are no diagnoses linked to this encounter.  No follow-ups on file.  Reece Packer, MD  [facility] [address] [place], [place]  [id number] [phone]

## 2020-01-04 NOTE — Addendum Note (Signed)
Addended by: Verlene Mayer A on: 01/04/2020 02:58 PM   Modules accepted: Orders

## 2020-01-08 ENCOUNTER — Ambulatory Visit (INDEPENDENT_AMBULATORY_CARE_PROVIDER_SITE_OTHER): Payer: Medicare Other | Admitting: Family Medicine

## 2020-01-08 ENCOUNTER — Encounter: Payer: Self-pay | Admitting: Family Medicine

## 2020-01-08 ENCOUNTER — Other Ambulatory Visit (HOSPITAL_COMMUNITY)
Admission: RE | Admit: 2020-01-08 | Discharge: 2020-01-08 | Disposition: A | Discharge status: Home / Self Care | Payer: Medicare Other | Source: Ambulatory Visit | Attending: Family Medicine | Admitting: Family Medicine

## 2020-01-08 ENCOUNTER — Other Ambulatory Visit: Payer: Self-pay

## 2020-01-08 VITALS — BP 120/60 | HR 84 | Temp 97.1°F | Resp 16 | Ht 68.0 in | Wt 179.8 lb

## 2020-01-08 DIAGNOSIS — Z124 Encounter for screening for malignant neoplasm of cervix: Secondary | ICD-10-CM | POA: Insufficient documentation

## 2020-01-08 DIAGNOSIS — N898 Other specified noninflammatory disorders of vagina: Secondary | ICD-10-CM

## 2020-01-08 DIAGNOSIS — G35 Multiple sclerosis: Secondary | ICD-10-CM

## 2020-01-08 DIAGNOSIS — M797 Fibromyalgia: Secondary | ICD-10-CM

## 2020-01-08 DIAGNOSIS — E785 Hyperlipidemia, unspecified: Secondary | ICD-10-CM

## 2020-01-08 DIAGNOSIS — E559 Vitamin D deficiency, unspecified: Secondary | ICD-10-CM | POA: Diagnosis not present

## 2020-01-08 DIAGNOSIS — M791 Myalgia, unspecified site: Secondary | ICD-10-CM

## 2020-01-08 DIAGNOSIS — I209 Angina pectoris, unspecified: Secondary | ICD-10-CM | POA: Diagnosis not present

## 2020-01-08 DIAGNOSIS — Z1151 Encounter for screening for human papillomavirus (HPV): Secondary | ICD-10-CM | POA: Insufficient documentation

## 2020-01-08 DIAGNOSIS — R32 Unspecified urinary incontinence: Secondary | ICD-10-CM

## 2020-01-08 DIAGNOSIS — M542 Cervicalgia: Secondary | ICD-10-CM

## 2020-01-08 DIAGNOSIS — F5104 Psychophysiologic insomnia: Secondary | ICD-10-CM

## 2020-01-08 DIAGNOSIS — G8929 Other chronic pain: Secondary | ICD-10-CM

## 2020-01-08 DIAGNOSIS — E538 Deficiency of other specified B group vitamins: Secondary | ICD-10-CM

## 2020-01-08 DIAGNOSIS — T466X5A Adverse effect of antihyperlipidemic and antiarteriosclerotic drugs, initial encounter: Secondary | ICD-10-CM

## 2020-01-08 MED ORDER — EZETIMIBE 10 MG PO TABS: 10.0000 mg | ORAL_TABLET | Freq: Every day | ORAL | 1 refills | Status: DC

## 2020-01-08 NOTE — Progress Notes (Addendum)
Name: Ashlee Richardson   MRN: [id number]    DOB: 1956/11/13   Date:01/08/2020       Progress Note  Subjective  Chief Complaint  Chief Complaint  Patient presents with  . Medication Refill    6 month F/U  . Grieving    Lost grand daughter to a car accident in November 2020 and she left behind two young children. Paternal aunt died in 29-Dec-2023 from cancer    HPI  MS: under the care of Dr. Manuella Ghazi , had a positive IG for herpes on spinal tap but was given reassurance by Dr. Ola Spurr. She denies weakness, but has intermittent tingling an numbness on both arms. Stable and not on medication. MRI showed white plaques. She has some tremors and sometimes decrease in memory, on duloxetine but stopped gabapentin, she is now on low dose lyrica but only takes it prn  She also has Aricept on her active medication list but not taking it daily   Chronic back pain and neck pain, also has FMS: used to see Dr. Primus Bravo, currently trying to go to [facility] to see Dr. Lorin Mercy for back pain. She is compliant with Duloxetine but stopped gabapentin because it was making her have dizziness and headache , she is now on lyrica, states she will have PT because having radiculitis down her left arm  GERD and gastritis: seen by Dr. Marius Ditch, taking Dexilant and symptoms are controlled . She was diagnosed with hiatal hernia also   B12 deficiency and Vitamin D deficiency: on oral supplementation for vitamin D and B12 and last levels at goal   Insomnia: she states Trazodone helps her sleep, no side effects   RLS: taking Requip but not every night, she states medication seems to help with symptoms   Hyperglycemia: A1C was 6% , she denies polyphagia, polydipsia but she has urinary frequency   Angina Pectoris: seen by Dr. Clayborn Bigness on statin therapy but states unable to take it daily because increases muscle aches, explained last LDL was not at goal, we will add zetia . She still has episodes of chest pain, described as  sharp on left side and intermittent throughout the day when it happens. Associated with mild SOB, but not recently   FMS: she has aches and pains all day, 8/10 on duloxetine, could not tolerate gabapentin, she takes Tramadol to decrease pain at night, she is back on lower of lyrica and seems to be helping with pain , she takes it prn   Mixed incontinence: she was seen here in Dec 29, 2023 and negative urine culture, she was seen by Urologist last week and states she needs an antibiotic to clear it up, explained not an urinary tract infection at this time. She states she has noticed vaginal itching, some times has a foul smell and brown discharge. She states she is getting tired of seeing doctors and nobody is doing anything about it. " I think I need an antibiotic to clear it up"   Grieving: she is still struggling but does not want to see a counselor at this time, states " too much stress"  Patient Active Problem List   Diagnosis Date Noted  . History of lumbar fusion 11/22/2019  . Pre-diabetes 10/26/2019  . GERD (gastroesophageal reflux disease) 04/28/2018  . Pure hypercholesterolemia 04/28/2018  . Diverticulosis 04/28/2018  . RLS (restless legs syndrome) 04/28/2018  . Angina pectoris ([place]) 04/28/2018  . Vitamin D deficiency 12/27/2017  . Chronic fatigue, unspecified 12/27/2017  . Intractable migraine without aura  and without status migrainosus 10/17/2015  . DDD (degenerative disc disease), cervical 05/23/2015  . Bilateral occipital neuralgia 05/23/2015  . DDD (degenerative disc disease), lumbar 05/23/2015  . Multiple sclerosis ([place]) 05/23/2015  . Cervical disc disorder with radiculopathy of cervical region 04/20/2015  . Right arm numbness 04/20/2015  . Right arm weakness 04/20/2015    Past Surgical History:  Procedure Laterality Date  . BACK SURGERY  1991?   Pt usure of date  . BREAST BIOPSY Right   . COLONOSCOPY  09/2007  . COLONOSCOPY WITH PROPOFOL N/A 08/28/2017   Procedure:  COLONOSCOPY WITH PROPOFOL;  Surgeon: Christene Lye, MD;  Location: [facility] ENDOSCOPY;  Service: Endoscopy;  Laterality: N/A;  . ESOPHAGOGASTRODUODENOSCOPY (EGD) WITH PROPOFOL N/A 11/24/2018   Procedure: ESOPHAGOGASTRODUODENOSCOPY (EGD) WITH PROPOFOL;  Surgeon: Lin Landsman, MD;  Location: [facility] ENDOSCOPY;  Service: Gastroenterology;  Laterality: N/A;  . Foot sugery Right    3rd toe from the right"    Family History  Problem Relation Age of Onset  . Heart disease Mother   . Diabetes Mother   . Kidney disease Mother   . Hypertension Mother   . Heart disease Father   . Hyperlipidemia Father   . Hypertension Father   . Heart disease Other   . Cancer Other        Breast  . Diabetes Other   . Hyperlipidemia Sister   . Hypertension Sister   . Kidney disease Sister   . Diabetes Sister   . Lung cancer Paternal Aunt     Social History   Socioeconomic History  . Marital status: Single    Spouse name: Not on file  . Number of children: 2  . Years of education: Not on file  . Highest education level: Associate degree: academic program  Occupational History  . 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 Smoking status: Never Smoker  . Smokeless tobacco: Never Used  . Tobacco comment: smoking cessation materials not required  Substance and Sexual Activity  . Alcohol use: No    Alcohol/week: 0.0 standard drinks  . Drug use: No  . Sexual activity: Yes    Birth control/protection: Post-menopausal  Other Topics Concern  . Not on file  Social History Narrative   Lives at home by herself.   Disable from chronic back pain since 1993   Diagnosed with MS in the 2000's   Social Determinants of Health   Financial Resource Strain: Medium Risk  . Difficulty of Paying Living Expenses: Somewhat hard  Food Insecurity: No Food Insecurity  . Worried About Charity fundraiser in the Last Year: Never true   . Ran Out of Food in the Last Year: Never true  Transportation Needs: No Transportation Needs  . Lack of Transportation (Medical): No  . Lack of Transportation (Non-Medical): No  Physical Activity: Sufficiently Active  . Days of Exercise per Week: 3 days  . Minutes of Exercise per Session: 60 min  Stress:   . Feeling of Stress : Not on file  Social Connections:   . Frequency of Communication with Friends and Family: Not on file  . Frequency of Social Gatherings with Friends and Family: Not on file  . Attends Religious Services: Not on file  . Active Member of Clubs or Organizations: Not on file  . Attends Archivist Meetings: Not on file  . Marital Status: Not  on file  Intimate Partner Violence:   . Fear of Current or Ex-Partner: Not on file  . Emotionally Abused: Not on file  . Physically Abused: Not on file  . Sexually Abused: Not on file     Current Outpatient Medications:  .  Calcium-Magnesium-Vitamin D 300-150-400 MG-MG-UNIT TABS, Take by mouth., Disp: , Rfl:  .  cholecalciferol (VITAMIN D3) 25 MCG (1000 UT) tablet, Take 1,000 Units by mouth daily., Disp: , Rfl:  .  DEXILANT 60 MG capsule, TAKE 1 CAPSULE BY MOUTH EVERY DAY, Disp: 90 capsule, Rfl: 0 .  donepezil (ARICEPT) 5 MG tablet, Take 1 tablet by mouth daily., Disp: , Rfl:  .  DULoxetine (CYMBALTA) 60 MG capsule, Take 60 mg by mouth daily., Disp: , Rfl:  .  erythromycin ophthalmic ointment, , Disp: , Rfl:  .  ferrous sulfate 325 (65 FE) MG EC tablet, Take 325 mg by mouth daily., Disp: , Rfl:  .  fluticasone (FLONASE) 50 MCG/ACT nasal spray, SPRAY 1 SPRAY INTO EACH NOSTRIL EVERY DAY, Disp: , Rfl: 0 .  magnesium oxide (MAG-OX) 400 MG tablet, Take 400 mg by mouth daily., Disp: , Rfl:  .  meclizine (ANTIVERT) 25 MG tablet, Take 25 mg by mouth 3 (three) times daily as needed for dizziness., Disp: , Rfl:  .  meloxicam (MOBIC) 15 MG tablet, TAKE 1 TABLET BY MOUTH EVERY DAY, Disp: 90 tablet, Rfl: 0 .  mirabegron ER  (MYRBETRIQ) 50 MG TB24 tablet, Take 1 tablet (50 mg total) by mouth daily., Disp: 30 tablet, Rfl: 11 .  MURO 128 5 % ophthalmic solution, INSTILL 1 DROP IN RIGHT EYE THREE TIMES PER DAY, Disp: , Rfl:  .  pregabalin (LYRICA) 50 MG capsule, Take 1 capsule (50 mg total) by mouth 3 (three) times daily., Disp: 90 capsule, Rfl: 0 .  rOPINIRole (REQUIP) 0.25 MG tablet, Take 1 tablet (0.25 mg total) by mouth at bedtime., Disp: 30 tablet, Rfl: 5 .  rosuvastatin (CRESTOR) 20 MG tablet, TAKE 1 TABLET BY MOUTH EVERY DAY, Disp: 90 tablet, Rfl: 0 .  SUMAtriptan (IMITREX) 100 MG tablet, Take as directed PRN migraines, Disp: , Rfl:  .  traZODone (DESYREL) 50 MG tablet, TAKE 1/2 TO 1 TABLET BY MOUTH AT BEDTIME AS NEEDED FOR SLEEP., Disp: 90 tablet, Rfl: 0 .  vitamin B-12 (CYANOCOBALAMIN) 1000 MCG tablet, Take 1,000 mcg by mouth daily., Disp: , Rfl:  .  ezetimibe (ZETIA) 10 MG tablet, Take 1 tablet (10 mg total) by mouth daily., Disp: 90 tablet, Rfl: 1  Allergies  Allergen Reactions  . Fenofibric Acid   . Lipitor [Atorvastatin]   . Nsaids     I personally reviewed active problem list, medication list, allergies, family history, social history with the patient/caregiver today.   ROS  Constitutional: Negative for fever or weight change.  Respiratory: Negative for cough but has  shortness of breath with activity    Cardiovascular: Negative for chest pain or palpitations.  Gastrointestinal: Negative for abdominal pain, no bowel changes.  Musculoskeletal: Negative for gait problem or joint swelling.  Skin: Negative for rash.  Neurological: Negative for dizziness or headache.  No other specific complaints in a complete review of systems (except as listed in HPI above).  Objective  Vitals:   01/08/20 1032  BP: 120/60  Pulse: 84  Resp: 16  Temp: (!) 97.1 F (36.2 C)  TempSrc: Temporal  SpO2: 99%  Weight: 179 lb 12.8 oz (81.6 kg)  Height: 5\' 8"  (1.727 m)  Body mass index is 27.34  kg/m.  Physical Exam  Constitutional: Patient appears well-developed and well-nourished. Overweight  No distress.  HEENT: head atraumatic, normocephalic, pupils equal and reactive to light Pelvic: vaginal atrophy, very mild discharge yellow, no odor, patient asked for pap smear today, friable cervix, normal bimanual exam Cardiovascular: Normal rate, regular rhythm and normal heart sounds.  No murmur heard. No BLE edema. Pulmonary/Chest: Effort normal and breath sounds normal. No respiratory distress. Abdominal: Soft.  There is no tenderness. Psychiatric: Patient has a normal mood and affect. behavior is normal. Judgment and thought content normal.  Recent Results (from the past 2160 hour(s))  Urine Culture     Status: None   Collection Time: 10/23/19 12:00 AM   Specimen: Urine  Result Value Ref Range   MICRO NUMBER: [id number]    SPECIMEN QUALITY: Adequate    Sample Source URINE    STATUS: FINAL    Result:      Less than 10,000 CFU/mL of single Gram positive organism isolated. No further testing will be performed. If clinically indicated, recollection using a method to minimize contamination, with prompt transfer to Urine Culture Transport Tube, is recommended.  Lipid panel     Status: Abnormal   Collection Time: 10/23/19 12:00 AM  Result Value Ref Range   Cholesterol 201 (H) <200 mg/dL   HDL 68 > OR = 50 mg/dL   Triglycerides 115 <150 mg/dL   LDL Cholesterol (Calc) 110 (H) mg/dL (calc)    Comment: Reference range: <100 . Desirable range <100 mg/dL for primary prevention;   <70 mg/dL for patients with CHD or diabetic patients  with > or = 2 CHD risk factors. Marland Kitchen LDL-C is now calculated using the Martin-Hopkins  calculation, which is a validated novel method providing  better accuracy than the Friedewald equation in the  estimation of LDL-C.  Cresenciano Genre et al. Annamaria Helling. [id number]): 2061-2068  (http://education.[facility].com/faq/FAQ164)    Total CHOL/HDL Ratio 3.0 <5.0  (calc)   Non-HDL Cholesterol (Calc) 133 (H) <130 mg/dL (calc)    Comment: For patients with diabetes plus 1 major ASCVD risk  factor, treating to a non-HDL-C goal of <100 mg/dL  (LDL-C of <70 mg/dL) is considered a therapeutic  option.   COMPLETE METABOLIC PANEL WITH GFR     Status: Abnormal   Collection Time: 10/23/19 12:00 AM  Result Value Ref Range   Glucose, Bld 105 (H) 65 - 99 mg/dL    Comment: .            Fasting reference interval . For someone without known diabetes, a glucose value between 100 and 125 mg/dL is consistent with prediabetes and should be confirmed with a follow-up test. .    BUN 12 7 - 25 mg/dL   Creat 0.72 0.50 - 0.99 mg/dL    Comment: For patients >38 years of age, the reference limit for Creatinine is approximately 13% higher for people identified as African-American. .    GFR, Est Non African American 90 > OR = 60 mL/min/1.25m2   GFR, Est African American 104 > OR = 60 mL/min/1.24m2   BUN/Creatinine Ratio NOT APPLICABLE 6 - 22 (calc)   Sodium 143 135 - 146 mmol/L   Potassium 4.1 3.5 - 5.3 mmol/L   Chloride 106 98 - 110 mmol/L   CO2 28 20 - 32 mmol/L   Calcium 9.7 8.6 - 10.4 mg/dL   Total Protein 6.6 6.1 - 8.1 g/dL   Albumin 4.5 3.6 - 5.1 g/dL  Globulin 2.1 1.9 - 3.7 g/dL (calc)   AG Ratio 2.1 1.0 - 2.5 (calc)   Total Bilirubin 0.5 0.2 - 1.2 mg/dL   Alkaline phosphatase (APISO) 55 37 - 153 U/L   AST 14 10 - 35 U/L   ALT 13 6 - 29 U/L  Hemoglobin A1c     Status: Abnormal   Collection Time: 10/23/19 12:00 AM  Result Value Ref Range   Hgb A1c MFr Bld 6.0 (H) <5.7 % of total Hgb    Comment: For someone without known diabetes, a hemoglobin  A1c value between 5.7% and 6.4% is consistent with prediabetes and should be confirmed with a  follow-up test. . For someone with known diabetes, a value <7% indicates that their diabetes is well controlled. A1c targets should be individualized based on duration of diabetes, age, comorbid conditions,  and other considerations. . This assay result is consistent with an increased risk of diabetes. . Currently, no consensus exists regarding use of hemoglobin A1c for diagnosis of diabetes for children. .    Mean Plasma Glucose 126 (calc)   eAG (mmol/L) 7.0 (calc)  VITAMIN D 25 Hydroxy (Vit-D Deficiency, Fractures)     Status: None   Collection Time: 10/23/19 12:00 AM  Result Value Ref Range   Vit D, 25-Hydroxy 32 30 - 100 ng/mL    Comment: Vitamin D Status         25-OH Vitamin D: . Deficiency:                    <20 ng/mL Insufficiency:             20 - 29 ng/mL Optimal:                 > or = 30 ng/mL . For 25-OH Vitamin D testing on patients on  D2-supplementation and patients for whom quantitation  of D2 and D3 fractions is required, the [facility](TM) 25-OH VIT D, (D2,D3), LC/MS/MS is recommended: order  code [id number] (patients >33yrs). See Note 1 . Note 1 . For additional information, please refer to  http://education.[facility].com/faq/FAQ199  (This link is being provided for informational/ educational purposes only.)   CBC with Differential/Platelet     Status: None   Collection Time: 10/23/19 12:00 AM  Result Value Ref Range   WBC 4.0 3.8 - 10.8 Thousand/uL   RBC 4.62 3.80 - 5.10 Million/uL   Hemoglobin 12.5 11.7 - 15.5 g/dL   HCT 38.9 35.0 - 45.0 %   MCV 84.2 80.0 - 100.0 fL   MCH 27.1 27.0 - 33.0 pg   MCHC 32.1 32.0 - 36.0 g/dL   RDW 12.3 11.0 - 15.0 %   Platelets 198 140 - 400 Thousand/uL   MPV 12.4 7.5 - 12.5 fL   Neutro Abs 1,736 1,500 - 7,800 cells/uL   Lymphs Abs 1,792 850 - 3,900 cells/uL   Absolute Monocytes 296 200 - 950 cells/uL   Eosinophils Absolute 108 15 - 500 cells/uL   Basophils Absolute 68 0 - 200 cells/uL   Neutrophils Relative % 43.4 %   Total Lymphocyte 44.8 %   Monocytes Relative 7.4 %   Eosinophils Relative 2.7 %   Basophils Relative 1.7 %  Vitamin B12     Status: None   Collection Time: 10/23/19 12:00 AM  Result Value  Ref Range   Vitamin B-12 782 200 - 1,100 pg/mL  POCT urinalysis dipstick     Status: Abnormal   Collection Time: 10/23/19 10:13 AM  Result Value Ref Range   Color, UA amber    Clarity, UA cloudy    Glucose, UA Negative Negative   Bilirubin, UA neg    Ketones, UA neg    Spec Grav, UA 1.015 1.010 - 1.025   Blood, UA neg    pH, UA 6.0 5.0 - 8.0   Protein, UA Negative Negative   Urobilinogen, UA 0.2 0.2 or 1.0 E.U./dL   Nitrite, UA neg    Leukocytes, UA Moderate (2+) (A) Negative   Appearance     Odor    Bladder Scan (Post Void Residual) in office     Status: None   Collection Time: 11/23/19  1:27 PM  Result Value Ref Range   Scan Result 50ML   Urinalysis, Complete     Status: Abnormal   Collection Time: 11/23/19  1:29 PM  Result Value Ref Range   Specific Gravity, UA <1.005 (L) 1.005 - 1.030   pH, UA 6.5 5.0 - 7.5   Color, UA Yellow Yellow   Appearance Ur Clear Clear   Leukocytes,UA Negative Negative   Protein,UA Negative Negative/Trace   Glucose, UA Negative Negative   Ketones, UA Negative Negative   RBC, UA Negative Negative   Bilirubin, UA Negative Negative   Urobilinogen, Ur 0.2 0.2 - 1.0 mg/dL   Nitrite, UA Negative Negative   Microscopic Examination See below:   Microscopic Examination     Status: None   Collection Time: 11/23/19  1:29 PM   URINE  Result Value Ref Range   WBC, UA None seen 0 - 5 /hpf   RBC None seen 0 - 2 /hpf   Epithelial Cells (non renal) 0-10 0 - 10 /hpf   Bacteria, UA Few None seen/Few  CULTURE, URINE COMPREHENSIVE     Status: None   Collection Time: 12/04/19 12:00 AM   Specimen: Urine  Result Value Ref Range   MICRO NUMBER: [id number]    SPECIMEN QUALITY: Adequate    Source OTHER (SPECIFY)    STATUS: FINAL    RESULT: No Growth   POCT urinalysis dipstick     Status: Abnormal   Collection Time: 12/04/19  2:44 PM  Result Value Ref Range   Color, UA yellow    Clarity, UA clear    Glucose, UA Negative Negative   Bilirubin, UA Neg     Ketones, UA Trace    Spec Grav, UA >=1.030 (A) 1.010 - 1.025   Blood, UA Trace    pH, UA 6.0 5.0 - 8.0   Protein, UA Negative Negative   Urobilinogen, UA 0.2 0.2 or 1.0 E.U./dL   Nitrite, UA neg    Leukocytes, UA Trace (A) Negative   Appearance normal    Odor Normal     PHQ2/9: Depression screen [facility] 2/9 01/08/2020 12/04/2019 10/23/2019 09/01/2019 08/13/2019  Decreased Interest 2 0 0 0 0  Down, Depressed, Hopeless 2 0 0 0 0  PHQ - 2 Score 4 0 0 0 0  Altered sleeping 0 2 3 0 0  Tired, decreased energy 3 2 3  0 0  Change in appetite 0 2 0 0 0  Feeling bad or failure about yourself  0 0 0 0 0  Trouble concentrating 0 0 0 0 0  Moving slowly or fidgety/restless 0 0 0 0 0  Suicidal thoughts 0 0 0 0 0  PHQ-9 Score 7 6 6  0 0  Difficult doing work/chores Somewhat difficult Somewhat difficult Not difficult at all Not difficult at all Not  difficult at all  Some recent data might be hidden    phq 9 is positive   Fall Risk: Fall Risk  01/08/2020 12/04/2019 10/23/2019 09/01/2019 08/13/2019  Falls in the past year? 0 0 0 0 0  Number falls in past yr: 0 0 0 0 0  Injury with Fall? 0 0 0 0 0  Comment - - - - -  Risk Factor Category  - - - - -  Risk for fall due to : - - - - -  Risk for fall due to: Comment - - - - -  Follow up - - - - -    Functional Status Survey: Is the patient deaf or have difficulty hearing?: No Does the patient have difficulty seeing, even when wearing glasses/contacts?: Yes Does the patient have difficulty concentrating, remembering, or making decisions?: No Does the patient have difficulty walking or climbing stairs?: No Does the patient have difficulty dressing or bathing?: No Does the patient have difficulty doing errands alone such as visiting a doctor's office or shopping?: No    Assessment & Plan  1. Angina pectoris (HCC)  - ezetimibe (ZETIA) 10 MG tablet; Take 1 tablet (10 mg total) by mouth daily.  Dispense: 90 tablet; Refill: 1  2. Dyslipidemia  -  ezetimibe (ZETIA) 10 MG tablet; Take 1 tablet (10 mg total) by mouth daily.  Dispense: 90 tablet; Refill: 1  3. Multiple sclerosis ([place])   4. Vitamin D deficiency   5. B12 deficiency  Keep supplementation   6. Psychophysiological insomnia  Taking medication prn   7. Fibromyalgia  Stable, may be responding to lyrica but takes prn only   8. Incontinence of urine in female  Seeing Urologist   9. Neck pain, chronic  Going to start PT again   10. Vaginal discharge  - Cervicovaginal ancillary only  11. Cervical cancer screening  - Cytology - PAP  12. Myalgia due to statin

## 2020-01-11 LAB — CERVICOVAGINAL ANCILLARY ONLY
Bacterial Vaginitis (gardnerella): NEGATIVE
Candida Glabrata: NEGATIVE
Candida Vaginitis: NEGATIVE
Comment: NEGATIVE
Comment: NEGATIVE
Comment: NEGATIVE
Comment: NEGATIVE
Trichomonas: NEGATIVE

## 2020-01-11 LAB — CYTOLOGY - PAP
Comment: NEGATIVE
Diagnosis: NEGATIVE
High risk HPV: NEGATIVE

## 2020-01-13 ENCOUNTER — Ambulatory Visit: Payer: Medicare Other | Attending: Orthopaedic Surgery | Admitting: Physical Therapy

## 2020-01-13 ENCOUNTER — Other Ambulatory Visit: Payer: Self-pay

## 2020-01-13 DIAGNOSIS — M542 Cervicalgia: Secondary | ICD-10-CM | POA: Insufficient documentation

## 2020-01-13 DIAGNOSIS — M6281 Muscle weakness (generalized): Secondary | ICD-10-CM | POA: Diagnosis not present

## 2020-01-13 DIAGNOSIS — M5412 Radiculopathy, cervical region: Secondary | ICD-10-CM | POA: Diagnosis not present

## 2020-01-13 DIAGNOSIS — R293 Abnormal posture: Secondary | ICD-10-CM | POA: Diagnosis not present

## 2020-01-13 NOTE — Patient Instructions (Signed)
Access Code: [email or web address]  URL: [email or web address]  Date: 01/13/2020  Prepared by: Hilda Blades   Exercises Seated Scapular Retraction - 10 reps - 1x daily - 7x weekly Seated Cervical Retraction Protraction AROM - 10 reps - 1x daily - 7x weekly Gentle Upper Trap Stretch - 10 reps - 1x daily - 7x weekly Seated Cervical Rotation AROM - 10 reps - 1x daily - 7x weekly

## 2020-01-14 ENCOUNTER — Encounter: Payer: Self-pay | Admitting: Physical Therapy

## 2020-01-14 NOTE — Therapy (Addendum)
[place], [place], [address] Phone: [phone]   Fax:  [phone]  Physical Therapy Evaluation / Discharge  Patient Details  Name: Ashlee Richardson MRN: [id number] Date of Birth: 1956-02-19 Referring Provider (PT): Marybelle Killings, MD   Encounter Date: 01/13/2020  PT End of Session - 01/13/20 1703    Visit Number  1    Number of Visits  8    Date for PT Re-Evaluation  03/09/20    Authorization Type  [employer] MCR    PT Start Time  1700    PT Stop Time  1735    PT Time Calculation (min)  35 min    Activity Tolerance  Patient limited by pain    Behavior During Therapy  [facility] for tasks assessed/performed       Past Medical History:  Diagnosis Date  . B12 deficiency 12/27/2017  . Diverticulitis   . Fibromyalgia   . GERD (gastroesophageal reflux disease)   . High cholesterol   . Migraine   . MS (multiple sclerosis) ([place])   . PUD (peptic ulcer disease) 04/28/2018    Past Surgical History:  Procedure Laterality Date  . BACK SURGERY  1991?   Pt usure of date  . BREAST BIOPSY Right   . COLONOSCOPY  09/2007  . COLONOSCOPY WITH PROPOFOL N/A 08/28/2017   Procedure: COLONOSCOPY WITH PROPOFOL;  Surgeon: Christene Lye, MD;  Location: [facility] ENDOSCOPY;  Service: Endoscopy;  Laterality: N/A;  . ESOPHAGOGASTRODUODENOSCOPY (EGD) WITH PROPOFOL N/A 11/24/2018   Procedure: ESOPHAGOGASTRODUODENOSCOPY (EGD) WITH PROPOFOL;  Surgeon: Lin Landsman, MD;  Location: [facility] ENDOSCOPY;  Service: Gastroenterology;  Laterality: N/A;  . Foot sugery Right    3rd toe from the right"    There were no vitals filed for this visit.   Subjective Assessment - 01/13/20 1700    Subjective  Patient reports severe neck pain that occurs just when she moves her head and it is very painful. She has popping and catching in her neck. At times the pain causes her to have a headaches. She is also having some left arm pain and reports the  entire left arm goes numb. She reports no mechanism of injury but has gradually gotten worse over the past few years. She also notes lower back pain and that she is unable to lay on her back.    Pertinent History  Diagnosed with MS, history of lumbar fusion, chronic fatigue and pain syndrome    Limitations  Sitting;Reading;Lifting;House hold activities;Walking    How long can you sit comfortably?  Patient reports she is always having pain    Diagnostic tests  X-ray    Patient Stated Goals  Get rid of the pain    Currently in Pain?  Yes    Pain Score  7    worst 10/10   Pain Location  Neck    Pain Orientation  Left    Pain Descriptors / Indicators  Sharp;Stabbing    Pain Type  Chronic pain    Pain Radiating Towards  entire left arm and hand goes numb    Pain Onset  More than a month ago    Pain Frequency  Constant    Aggravating Factors   All neck movement    Pain Relieving Factors  Nothing    Effect of Pain on Daily Activities  Patient feels she is limited in her daily activities and always in pain  [facility] PT Assessment - 01/14/20 0001      Assessment   Medical Diagnosis  Chronic neck pain    Referring Provider (PT)  Marybelle Killings, MD    Onset Date/Surgical Date  --   years   Hand Dominance  Right    Next MD Visit  02/02/2020    Prior Therapy  Yes - lumbar      Precautions   Precautions  None      Restrictions   Weight Bearing Restrictions  No      Balance Screen   Has the patient fallen in the past 6 months  No    Has the patient had a decrease in activity level because of a fear of falling?   No    Is the patient reluctant to leave their home because of a fear of falling?   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    Leisure  None      Cognition   Overall Cognitive Status  Within  Functional Limits for tasks assessed      Observation/Other Assessments   Observations  Patient appears guarded, she does not move her neck but instead moves her trunk to rotate to look different directions    Focus on Therapeutic Outcomes (FOTO)   50% limitation      Sensation   Light Touch  Appears Intact      Posture/Postural Control   Posture Comments  Patient exhibits a rounded shoulder posture, shugged and guarded posturing with little cervical motion      ROM / Strength   AROM / PROM / Strength  AROM;Strength      AROM   Overall AROM Comments  Shoulder AROM limited and painful due to having recent vaccinations in both shoulders    AROM Assessment Site  Cervical    Cervical Flexion  10    Cervical Extension  8    Cervical - Right Side Bend  10    Cervical - Left Side Bend  5    Cervical - Right Rotation  35    Cervical - Left Rotation  20      Strength   Overall Strength Comments  Not assessed secondary to patient's pain level      Palpation   Palpation comment  Patient was hypersensitive to light palpation of left cervical paraspinals, suboccipitals, upper trap region      Special Tests   Other special tests  Not able to perform due to pain level      Transfers   Transfers  Independent with all Transfers                Objective measurements completed on examination: See above findings.      [place] Adult PT Treatment/Exercise - 01/14/20 0001      Exercises   Exercises  Neck      Neck Exercises: Seated   Neck Retraction  10 reps    Neck Retraction Limitations  protraction-retraction through pain free range    Cervical Rotation  10 reps    Cervical Rotation Limitations  limited pain free range of motion    Lateral Flexion  10 reps    Lateral Flexion Limitations  limited pain free range of motion,  hold stretch if able    Other Seated Exercise  Seated scap retraction x10             PT Education - 01/13/20 1756    Education Details  Exam  findings, POC, HEP, gentle small movements to improve mobility and reduce guarding of neck    Person(s) Educated  Patient    Methods  Explanation;Demonstration;Tactile cues;Verbal cues;Handout    Comprehension  Verbalized understanding;Returned demonstration;Verbal cues required;Tactile cues required;Need further instruction       PT Short Term Goals - 01/13/20 1807      PT SHORT TERM GOAL #1   Title  Patient will be I in initial HEP to make progress in PT    Time  4    Period  Weeks    Status  New    Target Date  02/10/20      PT SHORT TERM GOAL #2   Title  Patient will exhibit improved cervical rotation > 10 deg to allow for improved driving ability    Time  4    Period  Weeks    Status  New    Target Date  02/10/20      PT SHORT TERM GOAL #3   Title  Patient will report resting neck pain </= 5/10 to allow for improved functional level.    Time  4    Period  Weeks    Status  New    Target Date  02/10/20        PT Long Term Goals - 01/13/20 1811      PT LONG TERM GOAL #1   Title  Patient will be I with final HEP to maintain progress in PT    Time  8    Period  Weeks    Status  New    Target Date  03/09/20      PT LONG TERM GOAL #2   Title  Patient will increased cervical extension >/= 20 deg to allow for improved ability to look up    Time  8    Period  Weeks    Status  New    Target Date  03/09/20      PT LONG TERM GOAL #3   Title  Patient will exhibit improved function to </= 44% via FOTO    Time  8    Period  Weeks    Status  New    Target Date  03/09/20      PT LONG TERM GOAL #4   Title  Patient will report </= 4/10 pain level with cervical movement to allow for improved functional level    Time  8    Period  Weeks    Status  New    Target Date  03/09/20             Plan - 01/13/20 1757    Clinical Impression Statement  Patient presents to PT with severe left sided neck pain that radiates down the left arm. The evaluation was limited due to  patients high pain level. She exhibits significant limitations in active motion of the cervical spine and displays a guarded posturing where she will move her entire trunk rather than move her neck. She had high levels of pain with light palpation to the left cervical parspinals and upper trap region. The symptoms down the left arm do seem radicular but it is unclear as the exam was limited and all movements produced pain. She was provided  with gentle active range of motion exercises and she would benefit from continued skilled PT to improve her pain and range of motion to allow her to return to prior level of function.    Personal Factors and Comorbidities  Past/Current Experience;Time since onset of injury/illness/exacerbation;Comorbidity 3+    Comorbidities  MS, chronic fatigue and pain syndrome, chronic back and neck pain with previous lumbar surgery,    Examination-Activity Limitations  Reach Overhead;Sit;Sleep;Dressing;Hygiene/Grooming;Lift;Carry    Examination-Participation Restrictions  Cleaning;Meal Prep;Community Activity;Laundry;Yard Work    Merchant navy officer  Evolving/Moderate complexity    Clinical Decision Making  Moderate    Rehab Potential  Fair    PT Frequency  1x / week    PT Duration  8 weeks    PT Treatment/Interventions  ADLs/Self Care Home Management;Cryotherapy;Electrical Stimulation;Moist Heat;Traction;Neuromuscular re-education;Therapeutic exercise;Therapeutic activities;Patient/family education;Dry needling;Passive range of motion;Taping;Spinal Manipulations;Joint Manipulations    PT Next Visit Plan  Assess HEP and progress PRN, manual if able, modalities to reduce pain and guarding, gentle cervical motion    PT Home Exercise Plan  [id number]: scap squeezes, cervical protraction/retraction, cervical side bending/upper trap stretch, cervical rotation    Consulted and Agree with Plan of Care  Patient       Patient will benefit from skilled therapeutic  intervention in order to improve the following deficits and impairments:  Decreased range of motion, Pain, Decreased activity tolerance, Impaired flexibility, Postural dysfunction, Decreased strength  Visit Diagnosis: Cervicalgia  Muscle weakness (generalized)  Radiculopathy, cervical region  Abnormal posture     Problem List Patient Active Problem List   Diagnosis Date Noted  . History of lumbar fusion 11/22/2019  . Pre-diabetes 10/26/2019  . GERD (gastroesophageal reflux disease) 04/28/2018  . Pure hypercholesterolemia 04/28/2018  . Diverticulosis 04/28/2018  . RLS (restless legs syndrome) 04/28/2018  . Angina pectoris ([place]) 04/28/2018  . Vitamin D deficiency 12/27/2017  . Chronic fatigue, unspecified 12/27/2017  . Intractable migraine without aura and without status migrainosus 10/17/2015  . DDD (degenerative disc disease), cervical 05/23/2015  . Bilateral occipital neuralgia 05/23/2015  . DDD (degenerative disc disease), lumbar 05/23/2015  . Multiple sclerosis ([place]) 05/23/2015  . Cervical disc disorder with radiculopathy of cervical region 04/20/2015  . Right arm numbness 04/20/2015  . Right arm weakness 04/20/2015    Hilda Blades, PT, DPT, LAT, ATC 01/14/20  8:08 AM Phone: [phone] Fax: [facility] [address] [place], [place], [address] Phone: [phone]   Fax:  [phone]  Name: AVONDA TOSO MRN: [id number] Date of Birth: [birth year]   PHYSICAL THERAPY DISCHARGE SUMMARY  Visits from Start of Care: 1  Current functional level related to goals / functional outcomes: See above   Remaining deficits: See above   Education / Equipment: HEP Plan: Patient agrees to discharge.  Patient goals were not met. Patient is being discharged due to not returning since the last visit.  ?????   Hilda Blades, PT, DPT, LAT, ATC 03/08/20  3:07 PM Phone: [phone] Fax:  [phone]

## 2020-01-18 ENCOUNTER — Other Ambulatory Visit: Payer: Self-pay

## 2020-01-18 DIAGNOSIS — Z20822 Contact with and (suspected) exposure to covid-19: Secondary | ICD-10-CM | POA: Diagnosis not present

## 2020-01-19 LAB — NOVEL CORONAVIRUS, NAA: SARS-CoV-2, NAA: NOT DETECTED

## 2020-01-22 ENCOUNTER — Ambulatory Visit: Payer: Medicare Other | Admitting: Physical Therapy

## 2020-01-26 ENCOUNTER — Ambulatory Visit: Payer: Medicare Other | Admitting: Physical Therapy

## 2020-02-02 ENCOUNTER — Encounter: Payer: Medicare Other | Admitting: Physical Therapy

## 2020-02-02 ENCOUNTER — Ambulatory Visit: Payer: Medicare Other | Admitting: Orthopaedic Surgery

## 2020-02-09 ENCOUNTER — Encounter: Payer: Medicare Other | Admitting: Physical Therapy

## 2020-02-16 ENCOUNTER — Ambulatory Visit: Payer: Medicare Other | Admitting: Orthopaedic Surgery

## 2020-02-23 ENCOUNTER — Ambulatory Visit: Payer: Medicare Other | Admitting: Orthopaedic Surgery

## 2020-03-01 ENCOUNTER — Ambulatory Visit: Payer: Medicare Other | Admitting: Orthopaedic Surgery

## 2020-03-02 ENCOUNTER — Encounter: Payer: Self-pay | Admitting: Orthopaedic Surgery

## 2020-03-02 ENCOUNTER — Other Ambulatory Visit: Payer: Self-pay

## 2020-03-02 ENCOUNTER — Ambulatory Visit (INDEPENDENT_AMBULATORY_CARE_PROVIDER_SITE_OTHER): Payer: Medicare Other | Admitting: Orthopaedic Surgery

## 2020-03-02 VITALS — BP 125/72 | HR 66 | Ht 68.0 in | Wt 175.0 lb

## 2020-03-02 DIAGNOSIS — M5010 Cervical disc disorder with radiculopathy, unspecified cervical region: Secondary | ICD-10-CM

## 2020-03-02 DIAGNOSIS — Z981 Arthrodesis status: Secondary | ICD-10-CM

## 2020-03-02 DIAGNOSIS — M5136 Other intervertebral disc degeneration, lumbar region: Secondary | ICD-10-CM | POA: Diagnosis not present

## 2020-03-02 DIAGNOSIS — M5030 Other cervical disc degeneration, unspecified cervical region: Secondary | ICD-10-CM | POA: Diagnosis not present

## 2020-03-02 NOTE — Progress Notes (Signed)
Office Visit Note   Patient: Ashlee Richardson           Date of Birth: May 02, 1956           MRN: [id number] Visit Date: 03/02/2020              Requested by: Steele Sizer, [address] [place],  [place] [address] PCP: Steele Sizer, MD   Assessment & Plan: Visit Diagnoses:  1. DDD (degenerative disc disease), cervical   2. DDD (degenerative disc disease), lumbar   3. Cervical disc disorder with radiculopathy of cervical region   4. 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  She states she is interested in proceeding with cervical fusion sometime either late summer or fall.  She understands she had been a soft collar for 6 weeks we discussed two-level cervical fusion C4-5 C5-6.  She like to resume physical therapy she will call and she will continue her home exercise program.  I discussed with her to be best avoid taking Norco or Percocet preoperatively.  If she feels her symptoms are severe enough she can call about scheduling surgery earlier.  Recheck 2 months.  Follow-Up Instructions: Return in about 2 months (around 05/02/2020).   Orders:  No orders of the defined types were placed in this encounter.  No orders of the defined types were placed in this encounter.     Procedures: No procedures performed   Clinical Data: No additional findings.   Subjective: Chief Complaint  Patient presents with  . Neck - Pain, Follow-up    HPI 64 year old female returns with ongoing problems with neck pain.  She went to physical therapy and was given home exercise exercises which she has been doing.  Physical therapy suggested massage states she got 1 2 weeks ago and that actually is giving her some improvement in her pain.  She still has pain that radiates from her  neck into her shoulder.  MRI scan in January showed spondylosis with effacement of the epidural space but no severe cord compression.  She did have moderate foraminal narrowing.  She states this is bothering her on a daily basis.  She had been on prednisone without relief patient is prediabetic.  She states Ultram in the past was not effective.  She has primarily C4-5 and C5-6 disease with moderate to severe foraminal compression.  Patient's been in pain management with Dr. Primus Bravo but states she is not sure where he is currently located.  Previous lumbar fusion by Dr. Raquel Sarna L4-S1 which is solid.  She states she has had some pain in her back that radiates into the buttocks and previous x-ray showed lumbar spondylosis with facet arthropathy endplate spurring at [id number] and L3-4.  Patient asked about taking stronger pain medication she states she never did get hooked on pain medication and always can get off of it.  Patient's had Lyrica, gabapentin in the past she is on Cymbalta.  Review of Systems updated unchanged from 12/22/2019 office visit.   Objective: Vital Signs: BP 125/72   Pulse 66   Ht 5\' 8"  (1.727 m)   Wt 175 lb (79.4 kg)   BMI 26.61 kg/m   Physical Exam Constitutional:      Appearance: She is well-developed.  HENT:  Head: Normocephalic.     Right Ear: External ear normal.     Left Ear: External ear normal.  Eyes:     Pupils: Pupils are equal, round, and reactive to light.  Neck:     Thyroid: No thyromegaly.     Trachea: No tracheal deviation.  Cardiovascular:     Rate and Rhythm: Normal rate.  Pulmonary:     Effort: Pulmonary effort is normal.  Abdominal:     Palpations: Abdomen is soft.  Skin:    General: Skin is warm and dry.  Neurological:     Mental Status: She is alert and oriented to person, place, and time.  Psychiatric:        Behavior: Behavior normal.     Ortho Exam patient has limited cervical range of motion.  Reflexes are 1+ and symmetrical biceps triceps  brachial radialis right and left.  Negative impingement the shoulders.  No supraclavicular lymphadenopathy.  No lower extremity clonus normal heel toe gait.  Specialty Comments:  No specialty comments available.  Imaging: No results found.   PMFS History: Patient Active Problem List   Diagnosis Date Noted  . History of lumbar fusion 11/22/2019  . Pre-diabetes 10/26/2019  . GERD (gastroesophageal reflux disease) 04/28/2018  . Pure hypercholesterolemia 04/28/2018  . Diverticulosis 04/28/2018  . RLS (restless legs syndrome) 04/28/2018  . Angina pectoris ([place]) 04/28/2018  . Vitamin D deficiency 12/27/2017  . Chronic fatigue, unspecified 12/27/2017  . Intractable migraine without aura and without status migrainosus 10/17/2015  . DDD (degenerative disc disease), cervical 05/23/2015  . Bilateral occipital neuralgia 05/23/2015  . DDD (degenerative disc disease), lumbar 05/23/2015  . Multiple sclerosis ([place]) 05/23/2015  . Cervical disc disorder with radiculopathy of cervical region 04/20/2015  . Right arm numbness 04/20/2015  . Right arm weakness 04/20/2015   Past Medical History:  Diagnosis Date  . B12 deficiency 12/27/2017  . Diverticulitis   . Fibromyalgia   . GERD (gastroesophageal reflux disease)   . High cholesterol   . Migraine   . MS (multiple sclerosis) ([place])   . PUD (peptic ulcer disease) 04/28/2018    Family History  Problem Relation Age of Onset  . Heart disease Mother   . Diabetes Mother   . Kidney disease Mother   . Hypertension Mother   . Heart disease Father   . Hyperlipidemia Father   . Hypertension Father   . Heart disease Other   . Cancer Other        Breast  . Diabetes Other   . Hyperlipidemia Sister   . Hypertension Sister   . Kidney disease Sister   . Diabetes Sister   . Lung cancer Paternal Aunt     Past Surgical History:  Procedure Laterality Date  . BACK SURGERY  1991?   Pt usure of date  . BREAST BIOPSY Right   . COLONOSCOPY  09/2007  .  COLONOSCOPY WITH PROPOFOL N/A 08/28/2017   Procedure: COLONOSCOPY WITH PROPOFOL;  Surgeon: Christene Lye, MD;  Location: [facility] ENDOSCOPY;  Service: Endoscopy;  Laterality: N/A;  . ESOPHAGOGASTRODUODENOSCOPY (EGD) WITH PROPOFOL N/A 11/24/2018   Procedure: ESOPHAGOGASTRODUODENOSCOPY (EGD) WITH PROPOFOL;  Surgeon: Lin Landsman, MD;  Location: [facility] ENDOSCOPY;  Service: Gastroenterology;  Laterality: N/A;  . Foot sugery Right    3rd toe from the right"   Social History   Occupational History  . Occupation: Agricultural consultant: [employer]    Comment: part time    Employer: DISABLED  Tobacco  Use  . Smoking status: Never Smoker  . Smokeless tobacco: Never Used  . Tobacco comment: smoking cessation materials not required  Substance and Sexual Activity  . Alcohol use: No    Alcohol/week: 0.0 standard drinks  . Drug use: No  . Sexual activity: Yes    Birth control/protection: Post-menopausal

## 2020-03-14 ENCOUNTER — Other Ambulatory Visit: Payer: Self-pay

## 2020-03-14 DIAGNOSIS — Z20822 Contact with and (suspected) exposure to covid-19: Secondary | ICD-10-CM | POA: Diagnosis not present

## 2020-03-15 LAB — NOVEL CORONAVIRUS, NAA: SARS-CoV-2, NAA: NOT DETECTED

## 2020-03-15 LAB — SARS-COV-2, NAA 2 DAY TAT

## 2020-03-21 ENCOUNTER — Other Ambulatory Visit: Payer: Self-pay | Admitting: Family Medicine

## 2020-03-21 DIAGNOSIS — F5104 Psychophysiologic insomnia: Secondary | ICD-10-CM

## 2020-03-21 NOTE — Telephone Encounter (Signed)
Requested Prescriptions  Pending Prescriptions Disp Refills  . traZODone (DESYREL) 50 MG tablet [Pharmacy Med Name: TRAZODONE 50 MG TABLET] 90 tablet 0    Sig: TAKE 1/2 TO 1 TABLET BY MOUTH AT BEDTIME AS NEEDED FOR SLEEP     Psychiatry: Antidepressants - Serotonin Modulator Passed - 03/21/2020  1:19 AM      Passed - Valid encounter within last 6 months    Recent Outpatient Visits          2 months ago Angina pectoris [facility])   [facility] Steele Sizer, MD   3 months ago Frequency of urination   [facility] [place], Drue Stager, MD   5 months ago Frequency of urination   [facility] Steele Sizer, MD   6 months ago Urge incontinence of urine   [facility] Steele Sizer, MD   7 months ago Neck pain, chronic   [facility] [place], Drue Stager, MD      Future Appointments            In 1 month Vanga, Tally Due, MD [place]   In 1 month Marybelle Killings, MD [place]   In 1 month MacDiarmid, Nicki Reaper, [place]   In 4 months  [facility], [facility]           . meloxicam (MOBIC) 15 MG tablet [Pharmacy Med Name: MELOXICAM 15 MG TABLET] 90 tablet 0    Sig: TAKE 1 TABLET BY MOUTH EVERY DAY     Analgesics:  COX2 Inhibitors Passed - 03/21/2020  1:19 AM      Passed - HGB in normal range and within 360 days    Hemoglobin  Date Value Ref Range Status  10/23/2019 12.5 11.7 - 15.5 g/dL Final  05/07/2016 12.5 11.1 - 15.9 g/dL Final         Passed - Cr in normal range and within 360 days    Creat  Date Value Ref Range Status  10/23/2019 0.72 0.50 - 0.99 mg/dL Final    Comment:    For patients >53 years of age, the reference limit for Creatinine is approximately 13% higher for people identified as African-American. Renella Cunas - Patient is not pregnant      Passed - Valid encounter within last 12 months   Recent Outpatient Visits          2 months ago Angina pectoris [facility])   [facility] Steele Sizer, MD   3 months ago Frequency of urination   [facility] Steele Sizer, MD   5 months ago Frequency of urination   [facility] Steele Sizer, MD   6 months ago Urge incontinence of urine   [facility] Steele Sizer, MD   7 months ago Neck pain, chronic   [facility] Steele Sizer, MD      Future Appointments            In 1 month Vanga, Tally Due, MD [place]   In 1 month Lorin Mercy, Thana Farr, MD [place]   In 1 month MacDiarmid, Nicki Reaper, MD [place]   In 4 months  [facility], [facility]

## 2020-03-23 DIAGNOSIS — S93492A Sprain of other ligament of left ankle, initial encounter: Secondary | ICD-10-CM | POA: Diagnosis not present

## 2020-03-23 DIAGNOSIS — M79675 Pain in left toe(s): Secondary | ICD-10-CM | POA: Diagnosis not present

## 2020-03-23 DIAGNOSIS — B351 Tinea unguium: Secondary | ICD-10-CM | POA: Diagnosis not present

## 2020-03-23 DIAGNOSIS — M79674 Pain in right toe(s): Secondary | ICD-10-CM | POA: Diagnosis not present

## 2020-03-23 DIAGNOSIS — M79672 Pain in left foot: Secondary | ICD-10-CM | POA: Diagnosis not present

## 2020-04-13 DIAGNOSIS — R413 Other amnesia: Secondary | ICD-10-CM | POA: Diagnosis not present

## 2020-04-13 DIAGNOSIS — G43019 Migraine without aura, intractable, without status migrainosus: Secondary | ICD-10-CM | POA: Diagnosis not present

## 2020-04-13 DIAGNOSIS — M65172 Other infective (teno)synovitis, left ankle and foot: Secondary | ICD-10-CM | POA: Diagnosis not present

## 2020-04-13 DIAGNOSIS — G2581 Restless legs syndrome: Secondary | ICD-10-CM | POA: Diagnosis not present

## 2020-04-13 DIAGNOSIS — S93402D Sprain of unspecified ligament of left ankle, subsequent encounter: Secondary | ICD-10-CM | POA: Diagnosis not present

## 2020-04-13 DIAGNOSIS — R9082 White matter disease, unspecified: Secondary | ICD-10-CM | POA: Diagnosis not present

## 2020-04-13 DIAGNOSIS — M7752 Other enthesopathy of left foot: Secondary | ICD-10-CM | POA: Diagnosis not present

## 2020-04-15 DIAGNOSIS — Z79899 Other long term (current) drug therapy: Secondary | ICD-10-CM | POA: Diagnosis not present

## 2020-04-20 DIAGNOSIS — H04123 Dry eye syndrome of bilateral lacrimal glands: Secondary | ICD-10-CM | POA: Diagnosis not present

## 2020-04-20 DIAGNOSIS — H5213 Myopia, bilateral: Secondary | ICD-10-CM | POA: Diagnosis not present

## 2020-04-25 ENCOUNTER — Ambulatory Visit: Payer: Medicare Other | Admitting: Gastroenterology

## 2020-05-04 ENCOUNTER — Ambulatory Visit: Payer: Medicare Other | Admitting: Orthopaedic Surgery

## 2020-05-09 ENCOUNTER — Ambulatory Visit: Payer: Self-pay | Admitting: Urology

## 2020-05-12 ENCOUNTER — Telehealth: Payer: Self-pay

## 2020-05-12 NOTE — Telephone Encounter (Signed)
Pt would like to speak with nurse first

## 2020-05-12 NOTE — Telephone Encounter (Signed)
Patient is having sore throat- she is using cough syrup. Patient was tested for COVID and was negative. Patient has has COVID vaccine and Shingles vaccine. Cough and nasal drainage also present. No fever. Patient does not want to come to office if she doesn't have to- she does agree to virtual visit if that can be arranged. Please let her know.  Patient not triaged as she wanted to speak to PCP and information gather in conversation.

## 2020-05-12 NOTE — Telephone Encounter (Signed)
Copied from [place] [id number]. Topic: General - Other >> May 12, 2020  8:38 AM Leward Quan A wrote: Reason for CRM: Patient called to speak to Dr Ancil Boozer said she prefer to not give me any information on what this call is about asking if Dr Ancil Boozer nurse can please call her at Ph# [phone]

## 2020-05-12 NOTE — Telephone Encounter (Signed)
Patient missed call from Dr. Ancil Boozer. Office, and would like a return call at  [phone].

## 2020-05-12 NOTE — Telephone Encounter (Signed)
Called patient she would like an appointment for today if not, she would like medication called in to treat symptoms.

## 2020-05-13 NOTE — Telephone Encounter (Signed)
Pt.notified

## 2020-05-18 DIAGNOSIS — R0981 Nasal congestion: Secondary | ICD-10-CM | POA: Diagnosis not present

## 2020-05-18 DIAGNOSIS — R519 Headache, unspecified: Secondary | ICD-10-CM | POA: Diagnosis not present

## 2020-05-18 DIAGNOSIS — R05 Cough: Secondary | ICD-10-CM | POA: Diagnosis not present

## 2020-05-18 DIAGNOSIS — J029 Acute pharyngitis, unspecified: Secondary | ICD-10-CM | POA: Diagnosis not present

## 2020-05-19 ENCOUNTER — Telehealth: Payer: Medicare Other | Admitting: Family Medicine

## 2020-05-26 DIAGNOSIS — T466X5A Adverse effect of antihyperlipidemic and antiarteriosclerotic drugs, initial encounter: Secondary | ICD-10-CM | POA: Insufficient documentation

## 2020-05-26 DIAGNOSIS — M791 Myalgia, unspecified site: Secondary | ICD-10-CM | POA: Insufficient documentation

## 2020-05-31 ENCOUNTER — Emergency Department
Admission: EM | Admit: 2020-05-31 | Discharge: 2020-05-31 | Disposition: A | Discharge status: Home / Self Care | Payer: Medicare Other | Attending: Emergency Medicine | Admitting: Emergency Medicine

## 2020-05-31 ENCOUNTER — Ambulatory Visit: Payer: Self-pay | Admitting: *Deleted

## 2020-05-31 ENCOUNTER — Other Ambulatory Visit: Payer: Self-pay

## 2020-05-31 ENCOUNTER — Emergency Department: Payer: Medicare Other

## 2020-05-31 DIAGNOSIS — Z5321 Procedure and treatment not carried out due to patient leaving prior to being seen by health care provider: Secondary | ICD-10-CM | POA: Diagnosis not present

## 2020-05-31 DIAGNOSIS — M79605 Pain in left leg: Secondary | ICD-10-CM | POA: Insufficient documentation

## 2020-05-31 DIAGNOSIS — M79604 Pain in right leg: Secondary | ICD-10-CM | POA: Diagnosis not present

## 2020-05-31 DIAGNOSIS — M7989 Other specified soft tissue disorders: Secondary | ICD-10-CM | POA: Diagnosis not present

## 2020-05-31 LAB — BASIC METABOLIC PANEL
Anion gap: 8 (ref 5–15)
BUN: 8 mg/dL (ref 8–23)
CO2: 27 mmol/L (ref 22–32)
Calcium: 9.7 mg/dL (ref 8.9–10.3)
Chloride: 105 mmol/L (ref 98–111)
Creatinine, Ser: 0.68 mg/dL (ref 0.44–1.00)
GFR calc Af Amer: >60 mL/min (ref >60)
GFR calc non Af Amer: >60 mL/min (ref >60)
Glucose, Bld: 106 mg/dL — ABNORMAL HIGH (ref 70–99)
Potassium: 4 mmol/L (ref 3.5–5.1)
Sodium: 140 mmol/L (ref 135–145)

## 2020-05-31 LAB — TROPONIN I (HIGH SENSITIVITY): Troponin I (High Sensitivity): 3 ng/L (ref <18)

## 2020-05-31 LAB — CBC
HCT: 40.4 % (ref 36.0–46.0)
Hemoglobin: 13.1 g/dL (ref 12.0–15.0)
MCH: 27.3 pg (ref 26.0–34.0)
MCHC: 32.4 g/dL (ref 30.0–36.0)
MCV: 84.3 fL (ref 80.0–100.0)
Platelets: 220 10*3/uL (ref 150–400)
RBC: 4.79 MIL/uL (ref 3.87–5.11)
RDW: 13.4 % (ref 11.5–15.5)
WBC: 5.7 10*3/uL (ref 4.0–10.5)
nRBC: 0 % (ref 0.0–0.2)

## 2020-05-31 NOTE — Telephone Encounter (Signed)
Pt called in concerned she may have a blood clot in her left leg.   She injured her left foot about a month ago.   She has a chipped bone and a torn ligament.   She is being treated by an orthopedic doctor. She is c/o pain going up her calf and into her thigh.  Also c//o pain in her shin.  My Aunt had a blood clot in her leg and they told her she could have died from it if she didn't get to the doctor.      There were no appts available  with any of the providers at [facility]   The protocol is to be seen within 4 hours.   I instructed her to go to the urgent care near her and have her leg examined today preferably within the next 4 hours.   She was agreeable to this.   "I've got a Zoom meeting coming up in 4 minutes (starts 10:30 AM) I need to get on but I will go to the urgent care when I finish this meeting.   She had to go in order to get her Zoom meeting.  I did emphasize the importance of calling 911 if she developed any chest pain/discomfort or shortness of breath.   She verbalized understanding.  I sent my notes to Dr. Ancil Boozer at [facility].      Reason for Disposition . [1] Thigh or calf pain AND [2] only 1 side AND [3] present > 1 hour  Answer Assessment - Initial Assessment Questions 1. ONSET: "When did the pain start?"      I hurt foot a month ago.   I chipped a bone and tore a ligament.   I saw Dr. Elvina Mattes.   I'm having pain on inner shinn going up into my thigh.  It's very painful. 2. LOCATION: "Where is the pain located?"      Left leg with pain on inner thigh and shin. 3. PAIN: "How bad is the pain?"    (Scale 1-10; or mild, moderate, severe)   -  MILD (1-3): doesn't interfere with normal activities    -  MODERATE (4-7): interferes with normal activities (e.g., work or school) or awakens from sleep, limping    -  SEVERE (8-10): excruciating pain, unable to do any normal activities, unable to walk     10 is the worst.   I could not walk.  I almost  went to the ED.   4. WORK OR EXERCISE: "Has there been any recent work or exercise that involved this part of the body?"      Had an injury a month ago. See above.  Last Friday it hurt so bad I thought I might go to the ED. 5. CAUSE: "What do you think is causing the leg pain?"     I'm wondering if I have a blood clot.  It will help the swelling to elevate my leg. 6. OTHER SYMPTOMS: "Do you have any other symptoms?" (e.g., chest pain, back pain, breathing difficulty, swelling, rash, fever, numbness, weakness)     I have numbness up the back of my calf.   7. PREGNANCY: "Is there any chance you are pregnant?" "When was your last menstrual period?"     N/A due to age  Protocols used: LEG PAIN-A-AH

## 2020-05-31 NOTE — ED Triage Notes (Signed)
Pt arrives to ED via POV under direction of her PCP to obtain an [place] to r/o a DVT in the left leg. Pt reports pain and swelling x1 week; pt reports injuring the left foot x1 month ago when her left foot got caught up under a rolling wheelchair. Pt denies CP; no c/o SHOB. Pt is not currently r/x'd any anticoagulants. CMS in left foot/calf WNL. Pt is A&O, in NAD; RR even, regular, and unlabored.

## 2020-06-06 ENCOUNTER — Other Ambulatory Visit: Payer: Self-pay | Admitting: Podiatry

## 2020-06-06 DIAGNOSIS — M67372 Transient synovitis, left ankle and foot: Secondary | ICD-10-CM | POA: Diagnosis not present

## 2020-06-06 DIAGNOSIS — S93402D Sprain of unspecified ligament of left ankle, subsequent encounter: Secondary | ICD-10-CM | POA: Diagnosis not present

## 2020-06-09 DIAGNOSIS — H524 Presbyopia: Secondary | ICD-10-CM | POA: Diagnosis not present

## 2020-06-13 ENCOUNTER — Other Ambulatory Visit: Payer: Self-pay | Admitting: Family Medicine

## 2020-06-13 DIAGNOSIS — F5104 Psychophysiologic insomnia: Secondary | ICD-10-CM

## 2020-06-14 ENCOUNTER — Other Ambulatory Visit: Payer: Self-pay

## 2020-06-15 ENCOUNTER — Ambulatory Visit (INDEPENDENT_AMBULATORY_CARE_PROVIDER_SITE_OTHER): Payer: Medicare Other | Admitting: Gastroenterology

## 2020-06-15 ENCOUNTER — Encounter: Payer: Self-pay | Admitting: Gastroenterology

## 2020-06-15 ENCOUNTER — Other Ambulatory Visit: Payer: Self-pay

## 2020-06-15 VITALS — BP 111/66 | HR 61 | Temp 97.9°F | Ht 68.0 in | Wt 183.5 lb

## 2020-06-15 DIAGNOSIS — K5909 Other constipation: Secondary | ICD-10-CM

## 2020-06-15 DIAGNOSIS — R140 Abdominal distension (gaseous): Secondary | ICD-10-CM | POA: Diagnosis not present

## 2020-06-15 NOTE — Progress Notes (Signed)
Cephas Darby, MD [address]  [place]  [place], [place] [address]  Main: [phone]  Fax: [phone]    Gastroenterology Consultation  Referring Provider:     Steele Sizer, MD Primary Care Physician:  Steele Sizer, MD Primary Gastroenterologist:  Dr. Cephas Darby Reason for Consultation:  chronic abdominal pain and bloating        HPI:   Ashlee Richardson is a 64 y.o. female referred by Dr. Steele Sizer, MD  for consultation & management of chronic abdominal pain and bloating.  Patient has chronic back pain issues for which she has been taking Mobic and has not been able to tolerate gabapentin. She also reports Chronic constipation, abdominal pain which is more or less generalized to the mid abdomen associated with significant bloating.  She reports her stools are always hard associated with straining and generally every other day.  Her last bowel movement was yesterday which was hard.  She tries to incorporate more fiber in her diet only.  She has not tried any stool softeners or fiber supplements.  She had a CT in 2018 which was fairly unremarkable except for benign cavernous hemangioma in the liver which was stable in size. She also reports severe burning pain in her throat, sensation of food stuck in her chest associated with regurgitation.  She has been taking omeprazole 20 mg once a day which is not helping.  Follow-up visit 01/12/2019 She underwent EGD which revealed small hiatal hernia.  Esophageal and gastric biopsies unremarkable.  She is currently taking omeprazole 40 mg twice daily with no relief.  She reports that her reflux symptoms have gotten worse since EGD and upset about it.  She reports regurgitation of the food associated with burning in her chest including at night.  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  She also reports that Trulance helped with constipation.  She ran out of samples but did not  call my office for a prescription.  She is currently constipated.  Follow-up visit 06/15/2020 Patient reports several months history of generalized abdominal discomfort, severe abdominal bloating and irregular bowel habits.  She has not seen me for last 1 year.  She was on Trulance before.  She is currently not on any regular bowel regimen.  Although she reports her stools are not hard, she does experience incomplete evacuation after every bowel movement.  Her weight has been stable.  Patient was recently in the ER secondary to pain and swelling in her left leg, to rule out DVT.  Underwent ultrasound which was negative.  She was also started on oral iron although her ferritin was 55.  NSAIDs: Mobic for chronic back pain  Antiplts/Anticoagulants/Anti thrombotics: none  GI Procedures: colonoscopy 2018 by Dr Jamal Collin Diverticulosis, otherwise normal colon Colonoscopy in 09/2007  EGD 11/24/2018 - Normal duodenal bulb, second portion of the duodenum and third portion of the duodenum. - 1 cm hiatal hernia. - Normal stomach. Biopsied. - Esophagogastric landmarks identified. - Normal gastroesophageal junction and esophagus. Biopsied.  DIAGNOSIS:  A. STOMACH, RANDOM; COLD BIOPSY:  - FOCAL MILD NON-SPECIFIC CHRONIC GASTRITIS.  - NEGATIVE FOR H. PYLORI, DYSPLASIA, AND MALIGNANCY.   B. ESOPHAGUS; COLD BIOPSY:  - UNREMARKALBE SQUAMOUS MUCOSA.  - NEGATIVE FOR EOSINOPHILS, DYSPLASIA, AND MALIGNANCY.  - FRAGMENT OF UNREMARKABLE GASTRIC MUCOSA.    Past Medical History:  Diagnosis Date  . B12 deficiency 12/27/2017  . Diverticulitis   . Fibromyalgia   .  GERD (gastroesophageal reflux disease)   . High cholesterol   . Migraine   . MS (multiple sclerosis) ([place])   . PUD (peptic ulcer disease) 04/28/2018    Past Surgical History:  Procedure Laterality Date  . BACK SURGERY  1991?   Pt usure of date  . BREAST BIOPSY Right   . COLONOSCOPY  09/2007  . COLONOSCOPY WITH PROPOFOL N/A 08/28/2017    Procedure: COLONOSCOPY WITH PROPOFOL;  Surgeon: Christene Lye, MD;  Location: [facility] ENDOSCOPY;  Service: Endoscopy;  Laterality: N/A;  . ESOPHAGOGASTRODUODENOSCOPY (EGD) WITH PROPOFOL N/A 11/24/2018   Procedure: ESOPHAGOGASTRODUODENOSCOPY (EGD) WITH PROPOFOL;  Surgeon: Lin Landsman, MD;  Location[facility] ENDOSCOPY;  Service: Gastroenterology;  Laterality: N/A;  . Foot sugery Right    3rd toe from the right"    Current Outpatient Medications:  .  albuterol (VENTOLIN HFA) 108 (90 Base) MCG/ACT inhaler, , Disp: , Rfl:  .  Calcium-Magnesium-Vitamin D 300-150-400 MG-MG-UNIT TABS, Take by mouth., Disp: , Rfl:  .  cholecalciferol (VITAMIN D3) 25 MCG (1000 UT) tablet, Take 1,000 Units by mouth daily., Disp: , Rfl:  .  DEXILANT 60 MG capsule, TAKE 1 CAPSULE BY MOUTH EVERY DAY, Disp: 90 capsule, Rfl: 0 .  Diclofenac-miSOPROStol 75-0.2 MG TBEC, Take by mouth., Disp: , Rfl:  .  donepezil (ARICEPT) 5 MG tablet, Take 1 tablet by mouth daily., Disp: , Rfl:  .  DULoxetine (CYMBALTA) 60 MG capsule, Take 60 mg by mouth daily., Disp: , Rfl:  .  erythromycin ophthalmic ointment, , Disp: , Rfl:  .  ezetimibe (ZETIA) 10 MG tablet, Take 1 tablet (10 mg total) by mouth daily., Disp: 90 tablet, Rfl: 1 .  ferrous sulfate 325 (65 FE) MG EC tablet, Take 325 mg by mouth daily., Disp: , Rfl:  .  fluticasone (FLONASE) 50 MCG/ACT nasal spray, SPRAY 1 SPRAY INTO EACH NOSTRIL EVERY DAY, Disp: , Rfl: 0 .  magnesium oxide (MAG-OX) 400 MG tablet, Take 400 mg by mouth daily., Disp: , Rfl:  .  meclizine (ANTIVERT) 25 MG tablet, Take 25 mg by mouth 3 (three) times daily as needed for dizziness., Disp: , Rfl:  .  meloxicam (MOBIC) 15 MG tablet, TAKE 1 TABLET BY MOUTH EVERY DAY, Disp: 90 tablet, Rfl: 0 .  mirabegron ER (MYRBETRIQ) 50 MG TB24 tablet, Take 1 tablet (50 mg total) by mouth daily., Disp: 30 tablet, Rfl: 11 .  MURO 128 5 % ophthalmic solution, INSTILL 1 DROP IN RIGHT EYE THREE TIMES PER DAY, Disp: , Rfl:  .   pregabalin (LYRICA) 50 MG capsule, Take 1 capsule (50 mg total) by mouth 3 (three) times daily., Disp: 90 capsule, Rfl: 0 .  rOPINIRole (REQUIP) 0.25 MG tablet, Take 1 tablet (0.25 mg total) by mouth at bedtime., Disp: 30 tablet, Rfl: 5 .  rosuvastatin (CRESTOR) 20 MG tablet, TAKE 1 TABLET BY MOUTH EVERY DAY, Disp: 90 tablet, Rfl: 0 .  SUMAtriptan (IMITREX) 100 MG tablet, Take as directed PRN migraines, Disp: , Rfl:  .  tiZANidine (ZANAFLEX) 2 MG tablet, TAKE 1 TABLET (2 MG TOTAL) BY MOUTH 3 (THREE) TIMES DAILY., Disp: , Rfl:  .  traZODone (DESYREL) 50 MG tablet, TAKE 1/2 TO 1 TABLET BY MOUTH AT BEDTIME AS NEEDED FOR SLEEP, Disp: 90 tablet, Rfl: 0 .  vitamin B-12 (CYANOCOBALAMIN) 1000 MCG tablet, Take 1,000 mcg by mouth daily., Disp: , Rfl:  .  AIMOVIG 140 MG/ML SOAJ, Inject into the skin. (Patient not taking: Reported on 06/15/2020), Disp: , Rfl:  Family History  Problem Relation Age of Onset  . Heart disease Mother   . Diabetes Mother   . Kidney disease Mother   . Hypertension Mother   . Heart disease Father   . Hyperlipidemia Father   . Hypertension Father   . Heart disease Other   . Cancer Other        Breast  . Diabetes Other   . Hyperlipidemia Sister   . Hypertension Sister   . Kidney disease Sister   . Diabetes Sister   . Lung cancer Paternal Aunt      Social History   Tobacco Use  . Smoking status: Never Smoker  . Smokeless tobacco: Never Used  . Tobacco comment: smoking cessation materials not required  Vaping Use  . Vaping Use: Never used  Substance Use Topics  . Alcohol use: No    Alcohol/week: 0.0 standard drinks  . Drug use: No    Allergies as of 06/15/2020 - Review Complete 06/15/2020  Allergen Reaction Noted  . Fenofibric acid  04/20/2015  . Lipitor [atorvastatin]  04/20/2015  . Nsaids  04/20/2015    Review of Systems:    All systems reviewed and negative except where noted in HPI.   Physical Exam:  BP 111/66 (BP Location: Left Arm, Patient  Position: Sitting, Cuff Size: Normal)   Pulse 61   Temp 97.9 F (36.6 C) (Oral)   Ht 5\' 8"  (1.727 m)   Wt 183 lb 8 oz (83.2 kg)   BMI 27.90 kg/m  No LMP recorded. Patient is postmenopausal.  General:   Alert,  Well-developed, well-nourished, pleasant and cooperative in NAD, patient is uncomfortable sitting due to low back pain Head:  Normocephalic and atraumatic. Eyes:  Sclera clear, no icterus.   Conjunctiva pink. Ears:  Normal auditory acuity. Nose:  No deformity, discharge, or lesions. Mouth:  No deformity or lesions,oropharynx pink & moist. Neck:  Supple; no masses or thyromegaly. Lungs:  Respirations even and unlabored.  Clear throughout to auscultation.   No wheezes, crackles, or rhonchi. No acute distress. Heart:  Regular rate and rhythm; no murmurs, clicks, rubs, or gallops. Abdomen:  Normal bowel sounds. Soft, severely distended, tympanic to percussion, mild generalized tenderness in the midabdomen, without masses, hepatosplenomegaly or hernias noted.  No guarding or rebound tenderness.   Rectal: Solid stool in the rectal vault Msk:  Symmetrical without gross deformities. Good, equal movement & strength bilaterally. Pulses:  Normal pulses noted. Extremities:  No clubbing or edema.  No cyanosis. Neurologic:  Alert and oriented x3;  grossly normal neurologically. Skin:  Intact without significant lesions or rashes. No jaundice. Psych:  Alert and cooperative. Normal mood and affect.  Imaging Studies: Reviewed  Assessment and Plan:   Ashlee Richardson is a 64 y.o. African-American female with obesity, chronic constipation seen in for chronic abdominal pain associated with bloating  Abdominal pain: Secondary to severe constipation CT A/P in 2018 was unremarkable except for 6.8 cm cavernous hemangioma in the liver which was benign and stable in size compared to prior imaging. Discussed with her to manage severe constipation Incorporate high-fiber foods in her diet,  information provided Fiber supplements and trial of stool softener Discontinue oral iron Magnesium citrate today MiraLAX twice daily  Patient is supposed to be on Amitiza 24 MCG twice daily, which she did not  GERD: Well-controlled EGD normal On Dexilant 60 mg daily  Follow up in 4 weeks   Cephas Darby, MD

## 2020-06-15 NOTE — Patient Instructions (Signed)
1. Stop iron, you do not have anemia 2. Take magnesium citrate, 1 bottle today 3. Start miralax 1-2 times daily 4. Follow up in 40month  Please call our office to speak with my nurse Ulyess Blossom [phone] during business hours from 8am to 4pm if you have any questions/concerns. During after hours, you will be redirected to on call GI physician. For any emergency please call 911 or go the nearest emergency room.    Cephas Darby, MD [address]  [place]  [place], [place] [address]  Main: [phone]  Fax: [phone]

## 2020-06-18 ENCOUNTER — Ambulatory Visit
Admission: RE | Admit: 2020-06-18 | Discharge: 2020-06-18 | Disposition: A | Discharge status: Home / Self Care | Payer: Medicare Other | Source: Ambulatory Visit | Attending: Podiatry | Admitting: Podiatry

## 2020-06-18 ENCOUNTER — Other Ambulatory Visit: Payer: Self-pay

## 2020-06-18 DIAGNOSIS — M67372 Transient synovitis, left ankle and foot: Secondary | ICD-10-CM | POA: Diagnosis not present

## 2020-06-18 DIAGNOSIS — S86112A Strain of other muscle(s) and tendon(s) of posterior muscle group at lower leg level, left leg, initial encounter: Secondary | ICD-10-CM | POA: Diagnosis not present

## 2020-06-24 DIAGNOSIS — S93402D Sprain of unspecified ligament of left ankle, subsequent encounter: Secondary | ICD-10-CM | POA: Diagnosis not present

## 2020-07-04 DIAGNOSIS — G5752 Tarsal tunnel syndrome, left lower limb: Secondary | ICD-10-CM | POA: Diagnosis not present

## 2020-07-04 DIAGNOSIS — S86112D Strain of other muscle(s) and tendon(s) of posterior muscle group at lower leg level, left leg, subsequent encounter: Secondary | ICD-10-CM | POA: Diagnosis not present

## 2020-07-11 DIAGNOSIS — S86112D Strain of other muscle(s) and tendon(s) of posterior muscle group at lower leg level, left leg, subsequent encounter: Secondary | ICD-10-CM | POA: Diagnosis not present

## 2020-07-11 DIAGNOSIS — R0602 Shortness of breath: Secondary | ICD-10-CM | POA: Diagnosis not present

## 2020-07-11 DIAGNOSIS — M79672 Pain in left foot: Secondary | ICD-10-CM | POA: Diagnosis not present

## 2020-07-11 DIAGNOSIS — K219 Gastro-esophageal reflux disease without esophagitis: Secondary | ICD-10-CM | POA: Diagnosis not present

## 2020-07-11 DIAGNOSIS — E7800 Pure hypercholesterolemia, unspecified: Secondary | ICD-10-CM | POA: Diagnosis not present

## 2020-07-11 DIAGNOSIS — I208 Other forms of angina pectoris: Secondary | ICD-10-CM | POA: Diagnosis not present

## 2020-07-11 DIAGNOSIS — R5382 Chronic fatigue, unspecified: Secondary | ICD-10-CM | POA: Diagnosis not present

## 2020-07-25 DIAGNOSIS — S86112D Strain of other muscle(s) and tendon(s) of posterior muscle group at lower leg level, left leg, subsequent encounter: Secondary | ICD-10-CM | POA: Diagnosis not present

## 2020-08-11 ENCOUNTER — Other Ambulatory Visit: Payer: Self-pay | Admitting: Family Medicine

## 2020-08-11 ENCOUNTER — Ambulatory Visit (INDEPENDENT_AMBULATORY_CARE_PROVIDER_SITE_OTHER): Payer: Medicare Other

## 2020-08-11 DIAGNOSIS — F5104 Psychophysiologic insomnia: Secondary | ICD-10-CM

## 2020-08-11 DIAGNOSIS — Z0000 Encounter for general adult medical examination without abnormal findings: Secondary | ICD-10-CM

## 2020-08-11 NOTE — Patient Instructions (Signed)
Ashlee Richardson , Thank you for taking time to come for your Medicare Wellness Visit. I appreciate your ongoing commitment to your health goals. Please review the following plan we discussed and let me know if I can assist you in the future.   Screening recommendations/referrals: Colonoscopy: done 08/28/17. Repeat in 2028.  Mammogram: done 12/08/18. Please call [phone] to schedule your mammogram.  Bone Density: due at age 64 Recommended yearly ophthalmology/optometry visit for glaucoma screening and checkup Recommended yearly dental visit for hygiene and checkup  Vaccinations: Influenza vaccine: done 01/11/20 Pneumococcal vaccine: due at age 27 Tdap vaccine: done 2017 Shingles vaccine: done 01/12/20 & 07/02/20  Covid-19: please bring your vaccination record with you to your next appt  Advanced directives: Advance directive discussed with you today. Even though you declined this today please call our office should you change your mind and we can give you the proper paperwork for you to fill out.  Conditions/risks identified: Recommend healthy eating and physical activity   Next appointment: Follow up in one year for your annual wellness visit.   Preventive Care 40-64 Years, Female Preventive care refers to lifestyle choices and visits with your health care provider that can promote health and wellness. What does preventive care include?  A yearly physical exam. This is also called an annual well check.  Dental exams once or twice a year.  Routine eye exams. Ask your health care provider how often you should have your eyes checked.  Personal lifestyle choices, including:  Daily care of your teeth and gums.  Regular physical activity.  Eating a healthy diet.  Avoiding tobacco and drug use.  Limiting alcohol use.  Practicing safe sex.  Taking low-dose aspirin daily starting at age 24.  Taking vitamin and mineral supplements as recommended by your health care provider. What  happens during an annual well check? The services and screenings done by your health care provider during your annual well check will depend on your age, overall health, lifestyle risk factors, and family history of disease. Counseling  Your health care provider may ask you questions about your:  Alcohol use.  Tobacco use.  Drug use.  Emotional well-being.  Home and relationship well-being.  Sexual activity.  Eating habits.  Work and work Statistician.  Method of birth control.  Menstrual cycle.  Pregnancy history. Screening  You may have the following tests or measurements:  Height, weight, and BMI.  Blood pressure.  Lipid and cholesterol levels. These may be checked every 5 years, or more frequently if you are over 53 years old.  Skin check.  Lung cancer screening. You may have this screening every year starting at age 55 if you have a 30-pack-year history of smoking and currently smoke or have quit within the past 15 years.  Fecal occult blood test (FOBT) of the stool. You may have this test every year starting at age 85.  Flexible sigmoidoscopy or colonoscopy. You may have a sigmoidoscopy every 5 years or a colonoscopy every 10 years starting at age 44.  Hepatitis C blood test.  Hepatitis B blood test.  Sexually transmitted disease (STD) testing.  Diabetes screening. This is done by checking your blood sugar (glucose) after you have not eaten for a while (fasting). You may have this done every 1-3 years.  Mammogram. This may be done every 1-2 years. Talk to your health care provider about when you should start having regular mammograms. This may depend on whether you have a family history of breast cancer.  BRCA-related cancer screening. This may be done if you have a family history of breast, ovarian, tubal, or peritoneal cancers.  Pelvic exam and Pap test. This may be done every 3 years starting at age 47. Starting at age 37, this may be done every 5 years  if you have a Pap test in combination with an HPV test.  Bone density scan. This is done to screen for osteoporosis. You may have this scan if you are at high risk for osteoporosis. Discuss your test results, treatment options, and if necessary, the need for more tests with your health care provider. Vaccines  Your health care provider may recommend certain vaccines, such as:  Influenza vaccine. This is recommended every year.  Tetanus, diphtheria, and acellular pertussis (Tdap, Td) vaccine. You may need a Td booster every 10 years.  Zoster vaccine. You may need this after age 51.  Pneumococcal 13-valent conjugate (PCV13) vaccine. You may need this if you have certain conditions and were not previously vaccinated.  Pneumococcal polysaccharide (PPSV23) vaccine. You may need one or two doses if you smoke cigarettes or if you have certain conditions. Talk to your health care provider about which screenings and vaccines you need and how often you need them. This information is not intended to replace advice given to you by your health care provider. Make sure you discuss any questions you have with your health care provider. Document Released: 01/06/2016 Document Revised: 08/29/2016 Document Reviewed: 10/11/2015 Elsevier Interactive Patient Education  2017 [place] Prevention in the Home Falls can cause injuries. They can happen to people of all ages. There are many things you can do to make your home safe and to help prevent falls. What can I do on the outside of my home?  Regularly fix the edges of walkways and driveways and fix any cracks.  Remove anything that might make you trip as you walk through a door, such as a raised step or threshold.  Trim any bushes or trees on the path to your home.  Use bright outdoor lighting.  Clear any walking paths of anything that might make someone trip, such as rocks or tools.  Regularly check to see if handrails are loose or  broken. Make sure that both sides of any steps have handrails.  Any raised decks and porches should have guardrails on the edges.  Have any leaves, snow, or ice cleared regularly.  Use sand or salt on walking paths during winter.  Clean up any spills in your garage right away. This includes oil or grease spills. What can I do in the bathroom?  Use night lights.  Install grab bars by the toilet and in the tub and shower. Do not use towel bars as grab bars.  Use non-skid mats or decals in the tub or shower.  If you need to sit down in the shower, use a plastic, non-slip stool.  Keep the floor dry. Clean up any water that spills on the floor as soon as it happens.  Remove soap buildup in the tub or shower regularly.  Attach bath mats securely with double-sided non-slip rug tape.  Do not have throw rugs and other things on the floor that can make you trip. What can I do in the bedroom?  Use night lights.  Make sure that you have a light by your bed that is easy to reach.  Do not use any sheets or blankets that are too big for your bed. They  should not hang down onto the floor.  Have a firm chair that has side arms. You can use this for support while you get dressed.  Do not have throw rugs and other things on the floor that can make you trip. What can I do in the kitchen?  Clean up any spills right away.  Avoid walking on wet floors.  Keep items that you use a lot in easy-to-reach places.  If you need to reach something above you, use a strong step stool that has a grab bar.  Keep electrical cords out of the way.  Do not use floor polish or wax that makes floors slippery. If you must use wax, use non-skid floor wax.  Do not have throw rugs and other things on the floor that can make you trip. What can I do with my stairs?  Do not leave any items on the stairs.  Make sure that there are handrails on both sides of the stairs and use them. Fix handrails that are  broken or loose. Make sure that handrails are as long as the stairways.  Check any carpeting to make sure that it is firmly attached to the stairs. Fix any carpet that is loose or worn.  Avoid having throw rugs at the top or bottom of the stairs. If you do have throw rugs, attach them to the floor with carpet tape.  Make sure that you have a light switch at the top of the stairs and the bottom of the stairs. If you do not have them, ask someone to add them for you. What else can I do to help prevent falls?  Wear shoes that:  Do not have high heels.  Have rubber bottoms.  Are comfortable and fit you well.  Are closed at the toe. Do not wear sandals.  If you use a stepladder:  Make sure that it is fully opened. Do not climb a closed stepladder.  Make sure that both sides of the stepladder are locked into place.  Ask someone to hold it for you, if possible.  Clearly mark and make sure that you can see:  Any grab bars or handrails.  First and last steps.  Where the edge of each step is.  Use tools that help you move around (mobility aids) if they are needed. These include:  Canes.  Walkers.  Scooters.  Crutches.  Turn on the lights when you go into a dark area. Replace any light bulbs as soon as they burn out.  Set up your furniture so you have a clear path. Avoid moving your furniture around.  If any of your floors are uneven, fix them.  If there are any pets around you, be aware of where they are.  Review your medicines with your doctor. Some medicines can make you feel dizzy. This can increase your chance of falling. Ask your doctor what other things that you can do to help prevent falls. This information is not intended to replace advice given to you by your health care provider. Make sure you discuss any questions you have with your health care provider. Document Released: 10/06/2009 Document Revised: 05/17/2016 Document Reviewed: 01/14/2015 Elsevier  Interactive Patient Education  2017 [employer].

## 2020-08-11 NOTE — Telephone Encounter (Signed)
Requested medication (s) are due for refill today -too soon  Requested medication (s) are on the active medication list -yes  Future visit scheduled -today  Last refill: 1 month ago  Notes to clinic: Patient requesting RF too soon- has appointment today- sent for PCP review   Requested Prescriptions  Pending Prescriptions Disp Refills   traZODone (DESYREL) 50 MG tablet 90 tablet 0    Sig: Take 0.5-1 tablets (25-50 mg total) by mouth at bedtime as needed. for sleep      Psychiatry: Antidepressants - Serotonin Modulator Failed - 08/11/2020 11:48 AM      Failed - Valid encounter within last 6 months    Recent Outpatient Visits           7 months ago Angina pectoris [facility])   [facility] Steele Sizer, MD   8 months ago Frequency of urination   [facility] Steele Sizer, MD   9 months ago Frequency of urination   [facility] Steele Sizer, MD   11 months ago Urge incontinence of urine   [facility] Steele Sizer, MD   12 months ago Neck pain, chronic   [facility] Steele Sizer, MD       Future Appointments             Today  [facility], [place]   In 1 month Vanga, Tally Due, MD [facility] [place]                Requested Prescriptions  Pending Prescriptions Disp Refills   traZODone (DESYREL) 50 MG tablet 90 tablet 0    Sig: Take 0.5-1 tablets (25-50 mg total) by mouth at bedtime as needed. for sleep      Psychiatry: Antidepressants - Serotonin Modulator Failed - 08/11/2020 11:48 AM      Failed - Valid encounter within last 6 months    Recent Outpatient Visits           7 months ago Angina pectoris [facility])   [facility] Steele Sizer, MD   8 months ago Frequency of urination   [facility] Steele Sizer, MD   9 months ago Frequency of urination   [facility] Steele Sizer, MD   11 months ago Urge incontinence of urine   [facility] Steele Sizer, MD   12 months ago Neck pain, chronic   [facility] Steele Sizer, MD       Future Appointments             Today  [facility], [place]   In 1 month Vanga, Tally Due, MD [place]

## 2020-08-11 NOTE — Progress Notes (Signed)
Subjective:   Ashlee Richardson is a 63 y.o. female who presents for Medicare Annual (Subsequent) preventive examination.  Virtual Visit via Telephone Note  I connected with  Ashlee Richardson on 08/11/20 at  2:10 PM EDT by telephone and verified that I am speaking with the correct person using two identifiers.  Medicare Annual Wellness visit completed telephonically due to Covid-19 pandemic.   Location: Patient: home Provider: [place]   I discussed the limitations, risks, security and privacy concerns of performing an evaluation and management service by telephone and the availability of in person appointments. The patient expressed understanding and agreed to proceed.  Unable to perform video visit due to video visit attempted and failed and/or patient does not have video capability.   Some vital signs may be absent or patient reported.   Clemetine Marker, LPN    Review of Systems     Cardiac Risk Factors include: dyslipidemia     Objective:    Today's Vitals   08/11/20 1415  PainSc: 7    There is no height or weight on file to calculate BMI.  Advanced Directives 08/11/2020 05/31/2020 01/13/2020 08/11/2019 11/24/2018 08/07/2018 08/28/2017  Does Patient Have a Medical Advance Directive? No No No No No No No  Would patient like information on creating a medical advance directive? No - Patient declined - No - Patient declined No - Patient declined No - Patient declined Yes (MAU/Ambulatory/Procedural Areas - Information given) No - Patient declined    Current Medications (verified) Outpatient Encounter Medications as of 08/11/2020  Medication Sig  . albuterol (VENTOLIN HFA) 108 (90 Base) MCG/ACT inhaler   . Calcium-Magnesium-Vitamin D 300-150-400 MG-MG-UNIT TABS Take by mouth.  . cholecalciferol (VITAMIN D3) 25 MCG (1000 UT) tablet Take 1,000 Units by mouth daily.  Marland Kitchen DEXILANT 60 MG capsule TAKE 1 CAPSULE BY MOUTH EVERY DAY  . Diclofenac-miSOPROStol 75-0.2 MG TBEC Take by mouth.  .  donepezil (ARICEPT) 5 MG tablet Take 1 tablet by mouth daily.  . DULoxetine (CYMBALTA) 60 MG capsule Take 60 mg by mouth daily.  Marland Kitchen erythromycin ophthalmic ointment   . ezetimibe (ZETIA) 10 MG tablet Take 1 tablet (10 mg total) by mouth daily.  . ferrous sulfate 325 (65 FE) MG EC tablet Take 325 mg by mouth daily.  . fluticasone (FLONASE) 50 MCG/ACT nasal spray SPRAY 1 SPRAY INTO EACH NOSTRIL EVERY DAY  . magnesium oxide (MAG-OX) 400 MG tablet Take 400 mg by mouth daily.  . meclizine (ANTIVERT) 25 MG tablet Take 25 mg by mouth 3 (three) times daily as needed for dizziness.  . meloxicam (MOBIC) 15 MG tablet TAKE 1 TABLET BY MOUTH EVERY DAY  . mirabegron ER (MYRBETRIQ) 50 MG TB24 tablet Take 1 tablet (50 mg total) by mouth daily.  Marland Kitchen MURO 128 5 % ophthalmic solution INSTILL 1 DROP IN RIGHT EYE THREE TIMES PER DAY  . pregabalin (LYRICA) 50 MG capsule Take 1 capsule (50 mg total) by mouth 3 (three) times daily.  Marland Kitchen rOPINIRole (REQUIP) 0.25 MG tablet Take 1 tablet (0.25 mg total) by mouth at bedtime.  . rosuvastatin (CRESTOR) 20 MG tablet TAKE 1 TABLET BY MOUTH EVERY DAY  . SUMAtriptan (IMITREX) 100 MG tablet Take as directed PRN migraines  . tiZANidine (ZANAFLEX) 2 MG tablet TAKE 1 TABLET (2 MG TOTAL) BY MOUTH 3 (THREE) TIMES DAILY.  . traZODone (DESYREL) 50 MG tablet TAKE 1/2 TO 1 TABLET BY MOUTH AT BEDTIME AS NEEDED FOR SLEEP  . vitamin B-12 (CYANOCOBALAMIN) 1000  MCG tablet Take 1,000 mcg by mouth daily.  Marland Kitchen AIMOVIG 140 MG/ML SOAJ Inject into the skin. (Patient not taking: Reported on 06/15/2020)   No facility-administered encounter medications on file as of 08/11/2020.    Allergies (verified) Fenofibric acid, Lipitor [atorvastatin], and Nsaids   History: Past Medical History:  Diagnosis Date  . B12 deficiency 12/27/2017  . Diverticulitis   . Fibromyalgia   . GERD (gastroesophageal reflux disease)   . High cholesterol   . Migraine   . MS (multiple sclerosis) ([place])   . PUD (peptic ulcer  disease) 04/28/2018   Past Surgical History:  Procedure Laterality Date  . BACK SURGERY  1991?   Pt usure of date  . BREAST BIOPSY Right   . COLONOSCOPY  09/2007  . COLONOSCOPY WITH PROPOFOL N/A 08/28/2017   Procedure: COLONOSCOPY WITH PROPOFOL;  Surgeon: Christene Lye, MD;  Location: [facility] ENDOSCOPY;  Service: Endoscopy;  Laterality: N/A;  . ESOPHAGOGASTRODUODENOSCOPY (EGD) WITH PROPOFOL N/A 11/24/2018   Procedure: ESOPHAGOGASTRODUODENOSCOPY (EGD) WITH PROPOFOL;  Surgeon: Lin Landsman, MD;  Location[facility] ENDOSCOPY;  Service: Gastroenterology;  Laterality: N/A;  . Foot sugery Right    3rd toe from the right"   Family History  Problem Relation Age of Onset  . Heart disease Mother   . Diabetes Mother   . Kidney disease Mother   . Hypertension Mother   . Heart disease Father   . Hyperlipidemia Father   . Hypertension Father   . Heart disease Other   . Cancer Other        Breast  . Diabetes Other   . Hyperlipidemia Sister   . Hypertension Sister   . Kidney disease Sister   . Diabetes Sister   . Lung cancer Paternal Aunt   . Cancer Maternal Grandmother        breast   Social History   Socioeconomic History  . Marital status: Single    Spouse name: Not on file  . Number of children: 2  . Years of education: Not on file  . Highest education level: Associate degree: academic program  Occupational History  . 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 Smoking status: Never Smoker  . Smokeless tobacco: Never Used  . Tobacco comment: smoking cessation materials not required  Vaping Use  . Vaping Use: Never used  Substance and Sexual Activity  . Alcohol use: No    Alcohol/week: 0.0 standard drinks  . Drug use: No  . Sexual activity: Yes    Birth control/protection: Post-menopausal  Other Topics Concern  . Not on file  Social History Narrative   Lives at home by herself.   Disable  from chronic back pain since 1993   Diagnosed with MS in the 2000's   Social Determinants of Health   Financial Resource Strain: Low Risk   . Difficulty of Paying Living Expenses: Not very hard  Food Insecurity: No Food Insecurity  . Worried About Charity fundraiser in the Last Year: Never true  . Ran Out of Food in the Last Year: Never true  Transportation Needs: No Transportation Needs  . Lack of Transportation (Medical): No  . Lack of Transportation (Non-Medical): No  Physical Activity: Sufficiently Active  . Days of Exercise per Week: 3 days  . Minutes of Exercise per Session: 60 min  Stress: Stress Concern Present  . Feeling of Stress : To  some extent  Social Connections: Moderately Integrated  . Frequency of Communication with Friends and Family: More than three times a week  . Frequency of Social Gatherings with Friends and Family: More than three times a week  . Attends Religious Services: More than 4 times per year  . Active Member of Clubs or Organizations: Yes  . Attends Archivist Meetings: More than 4 times per year  . Marital Status: Never married    Tobacco Counseling Counseling given: Not Answered Comment: smoking cessation materials not required   Clinical Intake:  Pre-visit preparation completed: Yes  Pain : 0-10 Pain Score: 7  Pain Type: Chronic pain Pain Location: Hand Pain Orientation: Right Pain Descriptors / Indicators: Aching Pain Onset: More than a month ago Pain Frequency: Constant     Nutritional Risks: None Diabetes: No  How often do you need to have someone help you when you read instructions, pamphlets, or other written materials from your doctor or pharmacy?: 1 - Never    Interpreter Needed?: No  Information entered by :: Clemetine Marker LPN   Activities of Daily Living In your present state of health, do you have any difficulty performing the following activities: 08/11/2020 01/08/2020  Hearing? N N  Comment declines  hearing aids -  Vision? N Y  Difficulty concentrating or making decisions? Y N  Walking or climbing stairs? N N  Dressing or bathing? N N  Doing errands, shopping? N N  Preparing Food and eating ? N -  Using the Toilet? N -  In the past six months, have you accidently leaked urine? Y -  Comment wears pads and depends -  Do you have problems with loss of bowel control? N -  Managing your Medications? N -  Managing your Finances? N -  Housekeeping or managing your Housekeeping? N -  Some recent data might be hidden    Patient Care Team: Steele Sizer, MD as PCP - General (Family Medicine) Christene Lye, MD as Consulting Physician (General Surgery) Vladimir Crofts, MD as Consulting Physician (Neurology) Yolonda Kida, MD as Consulting Physician (Cardiology) Elvina Mattes, Rodman Key, DPM as Consulting Physician (Podiatry)  Indicate any recent [place] you may have received from other than [facility] providers in the past year (date may be approximate).     Assessment:   This is a routine wellness examination for [place].  Hearing/Vision screen  Hearing Screening   125Hz  250Hz  500Hz  1000Hz  2000Hz  3000Hz  4000Hz  6000Hz  8000Hz   Right ear:           Left ear:           Comments: Pt denies hearing difficulty  Vision Screening Comments: [facility]  Dietary issues and exercise activities discussed: Current Exercise Habits: Home exercise routine, Type of exercise: walking, Time (Minutes): 60, Frequency (Times/Week): 3, Weekly Exercise (Minutes/Week): 180, Intensity: Mild, Exercise limited by: neurologic condition(s)  Goals    . DIET - INCREASE WATER INTAKE     Recommend to drink at least 6-8 8oz glasses of water per day.      Depression Screen PHQ 2/9 Scores 08/11/2020 01/08/2020 12/04/2019 10/23/2019 09/01/2019 08/13/2019 08/11/2019  PHQ - 2 Score 1 4 0 0 0 0 0  PHQ- 9 Score 7 7 6 6  0 0 -    Fall Risk Fall Risk  08/11/2020 01/08/2020  12/04/2019 10/23/2019 09/01/2019  Falls in the past year? 0 0 0 0 0  Number falls in past yr: 0 0  0 0 0  Injury with Fall? 0 0 0 0 0  Comment - - - - -  Risk Factor Category  - - - - -  Risk for fall due to : No Fall Risks - - - -  Risk for fall due to: Comment - - - - -  Follow up Falls prevention discussed - - - -    Any stairs in or around the home? Yes  If so, are there any without handrails? Yes  - steps outside, plans to discuss with landlord  Home free of loose throw rugs in walkways, pet beds, electrical cords, etc? Yes  Adequate lighting in your home to reduce risk of falls? Yes   ASSISTIVE DEVICES UTILIZED TO PREVENT FALLS:  Life alert? No Use of a cane, walker or w/c? No  Grab bars in the bathroom? No  Shower chair or bench in shower? No  Elevated toilet seat or a handicapped toilet? No   TIMED UP AND GO:  Was the test performed? No . Telephonic visit  Cognitive Function:     6CIT Screen 08/11/2020 08/07/2018  What Year? 0 points 0 points  What month? 0 points 0 points  What time? 0 points 0 points  Count back from 20 0 points 0 points  Months in reverse 0 points 2 points  Repeat phrase 2 points 4 points  Total Score 2 6    Immunizations Immunization History  Administered Date(s) Administered  . Influenza, Quadrivalent, Recombinant, Inj, Pf 01/11/2020  . Td 12/25/2015  . Zoster Recombinat (Shingrix) 01/12/2020, 07/02/2020    TDAP status: Up to date   Flu Vaccine status: Up to date   Pneumoccocal vaccine status: due at age 38  Covid-19 vaccine status: Completed vaccines  Qualifies for Shingles Vaccine? Yes   Zostavax completed No   Shingrix Completed?: Yes  Screening Tests Health Maintenance  Topic Date Due  . COVID-19 Vaccine (1) Never done  . MAMMOGRAM  12/09/2019  . INFLUENZA VACCINE  07/24/2020  . PAP SMEAR-Modifier  01/07/2023  . TETANUS/TDAP  12/24/2025  . COLONOSCOPY  08/29/2027  . Hepatitis C Screening  Completed  . HIV Screening   Completed    Health Maintenance  Health Maintenance Due  Topic Date Due  . COVID-19 Vaccine (1) Never done  . MAMMOGRAM  12/09/2019  . INFLUENZA VACCINE  07/24/2020    Colorectal cancer screening: Completed 08/28/17. Repeat every 10 years   Mammogram status: Completed 12/08/18. Repeat every year    Bone density screening: due at age 30  Lung Cancer Screening: (Low Dose CT Chest recommended if Age 34-80 years, 30 pack-year currently smoking OR have quit w/in 15years.) does not qualify.   Additional Screening:  Hepatitis C Screening: does qualify; Completed 09/06/17  Vision Screening: Recommended annual ophthalmology exams for early detection of glaucoma and other disorders of the eye. Is the patient up to date with their annual eye exam?  Yes  Who is the provider or what is the name of the office in which the patient attends annual eye exams? [place] Screening: Recommended annual dental exams for proper oral hygiene  Community Resource Referral / Chronic Care Management: CRR required this visit?  No   CCM required this visit?  No      Plan:     I have personally reviewed and noted the following in the patient's chart:   . Medical and social history . Use of alcohol, tobacco or illicit drugs  .  Current medications and supplements . Functional ability and status . Nutritional status . Physical activity . Advanced directives . List of other physicians . Hospitalizations, surgeries, and ER visits in previous 12 months . Vitals . Screenings to include cognitive, depression, and falls . Referrals and appointments  In addition, I have reviewed and discussed with patient certain preventive protocols, quality metrics, and best practice recommendations. A written personalized care plan for preventive services as well as general preventive health recommendations were provided to patient.     Clemetine Marker, LPN   [id number]   Nurse Notes: pt advised due  for appt with Dr. Ancil Boozer

## 2020-08-11 NOTE — Telephone Encounter (Signed)
Copied from [place] [id number]. Topic: Quick Communication - Rx Refill/Question >> Aug 11, 2020 11:39 AM Leward Quan A wrote: Medication: traZODone (DESYREL) 50 MG tablet   Has the patient contacted their pharmacy? No. (Agent: If no, request that the patient contact the pharmacy for the refill.) (Agent: If yes, when and what did the pharmacy advise?)  Preferred Pharmacy (with phone number or street name): [id number] [place], [place] - [address]  Phone:  [phone] Fax:  [phone]     Agent: Please be advised that RX refills may take up to 3 business days. We ask that you follow-up with your pharmacy.

## 2020-08-15 NOTE — Telephone Encounter (Signed)
Pt informed and will call back once she check her work schedule

## 2020-09-13 ENCOUNTER — Encounter: Payer: Self-pay | Admitting: Gastroenterology

## 2020-09-13 ENCOUNTER — Other Ambulatory Visit: Payer: Self-pay

## 2020-09-13 ENCOUNTER — Ambulatory Visit (INDEPENDENT_AMBULATORY_CARE_PROVIDER_SITE_OTHER): Payer: Medicare Other | Admitting: Gastroenterology

## 2020-09-13 VITALS — BP 114/71 | HR 75 | Temp 98.1°F | Ht 68.0 in | Wt 188.4 lb

## 2020-09-13 DIAGNOSIS — K5909 Other constipation: Secondary | ICD-10-CM

## 2020-09-13 DIAGNOSIS — R140 Abdominal distension (gaseous): Secondary | ICD-10-CM | POA: Diagnosis not present

## 2020-09-13 NOTE — Progress Notes (Signed)
Ashlee Darby, MD [address]  [place]  [place], [place] [address]  Main: [phone]  Fax: [phone]    Gastroenterology Consultation  Referring Provider:     Steele Sizer, MD Primary Care Physician:  Steele Sizer, MD Primary Gastroenterologist:  Dr. Cephas Richardson Reason for Consultation: Chronic constipation        HPI:   Ashlee Richardson is a 64 y.o. female referred by Dr. Steele Sizer, MD  for consultation & management of chronic abdominal pain and bloating.  Patient has chronic back pain issues for which she has been taking Mobic and has not been able to tolerate gabapentin. She also reports Chronic constipation, abdominal pain which is more or less generalized to the mid abdomen associated with significant bloating.  She reports her stools are always hard associated with straining and generally every other day.  Her last bowel movement was yesterday which was hard.  She tries to incorporate more fiber in her diet only.  She has not tried any stool softeners or fiber supplements.  She had a CT in 2018 which was fairly unremarkable except for benign cavernous hemangioma in the liver which was stable in size. She also reports severe burning pain in her throat, sensation of food stuck in her chest associated with regurgitation.  She has been taking omeprazole 20 mg once a day which is not helping.  Follow-up visit 01/12/2019 She underwent EGD which revealed small hiatal hernia.  Esophageal and gastric biopsies unremarkable.  She is currently taking omeprazole 40 mg twice daily with no relief.  She reports that her reflux symptoms have gotten worse since EGD and upset about it.  She reports regurgitation of the food associated with burning in her chest including at night.  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  She also reports that Trulance helped with constipation.  She ran out of samples but did not call my office for  a prescription.  She is currently constipated.  Follow-up visit 06/15/2020 Patient reports several months history of generalized abdominal discomfort, severe abdominal bloating and irregular bowel habits.  She has not seen me for last 1 year.  She was on Trulance before.  She is currently not on any regular bowel regimen.  Although she reports her stools are not hard, she does experience incomplete evacuation after every bowel movement.  Her weight has been stable.  Patient was recently in the ER secondary to pain and swelling in her left leg, to rule out DVT.  Underwent ultrasound which was negative.  She was also started on oral iron although her ferritin was 55.  Follow-up visit 09/13/2020 Patient has severe constipation.  She has not tried Amitiza.  She reports abdominal bloating and generalized abdominal discomfort that has been progressively getting worse.  She describes her stools as 2 on Bristol stool scale.  Last BM was about a week ago.  She reports her magnesium citrate helps.  She is not on oral iron anymore.  She does not drink sodas.  She denies any rectal bleeding  NSAIDs: Mobic for chronic back pain  Antiplts/Anticoagulants/Anti thrombotics: none  GI Procedures: colonoscopy 2018 by Dr Jamal Collin Diverticulosis, otherwise normal colon Colonoscopy in 09/2007  EGD 11/24/2018 - Normal duodenal bulb, second portion of the duodenum and third portion of the duodenum. - 1 cm hiatal hernia. - Normal stomach. Biopsied. - Esophagogastric landmarks identified. - Normal gastroesophageal junction and esophagus. Biopsied.  DIAGNOSIS:  A. STOMACH, RANDOM; COLD BIOPSY:  - FOCAL MILD NON-SPECIFIC CHRONIC GASTRITIS.  - NEGATIVE FOR H. PYLORI, DYSPLASIA, AND MALIGNANCY.   B. ESOPHAGUS; COLD BIOPSY:  - UNREMARKALBE SQUAMOUS MUCOSA.  - NEGATIVE FOR EOSINOPHILS, DYSPLASIA, AND MALIGNANCY.  - FRAGMENT OF UNREMARKABLE GASTRIC MUCOSA.    Past Medical History:  Diagnosis Date  . B12 deficiency  12/27/2017  . Diverticulitis   . Fibromyalgia   . GERD (gastroesophageal reflux disease)   . High cholesterol   . Migraine   . MS (multiple sclerosis) ([place])   . PUD (peptic ulcer disease) 04/28/2018    Past Surgical History:  Procedure Laterality Date  . BACK SURGERY  1991?   Pt usure of date  . BREAST BIOPSY Right   . COLONOSCOPY  09/2007  . COLONOSCOPY WITH PROPOFOL N/A 08/28/2017   Procedure: COLONOSCOPY WITH PROPOFOL;  Surgeon: Christene Lye, MD;  Location: [facility] ENDOSCOPY;  Service: Endoscopy;  Laterality: N/A;  . ESOPHAGOGASTRODUODENOSCOPY (EGD) WITH PROPOFOL N/A 11/24/2018   Procedure: ESOPHAGOGASTRODUODENOSCOPY (EGD) WITH PROPOFOL;  Surgeon: Lin Landsman, MD;  Location: [facility] ENDOSCOPY;  Service: Gastroenterology;  Laterality: N/A;  . Foot sugery Right    3rd toe from the right"    Current Outpatient Medications:  .  Calcium-Magnesium-Vitamin D 300-150-400 MG-MG-UNIT TABS, Take by mouth., Disp: , Rfl:  .  cholecalciferol (VITAMIN D3) 25 MCG (1000 UT) tablet, Take 1,000 Units by mouth daily., Disp: , Rfl:  .  DEXILANT 60 MG capsule, TAKE 1 CAPSULE BY MOUTH EVERY DAY, Disp: 90 capsule, Rfl: 0 .  Diclofenac-miSOPROStol 75-0.2 MG TBEC, Take by mouth., Disp: , Rfl:  .  donepezil (ARICEPT) 5 MG tablet, Take 1 tablet by mouth daily., Disp: , Rfl:  .  DULoxetine (CYMBALTA) 60 MG capsule, Take 60 mg by mouth daily., Disp: , Rfl:  .  ezetimibe (ZETIA) 10 MG tablet, Take 1 tablet (10 mg total) by mouth daily., Disp: 90 tablet, Rfl: 1 .  fluticasone (FLONASE) 50 MCG/ACT nasal spray, SPRAY 1 SPRAY INTO EACH NOSTRIL EVERY DAY, Disp: , Rfl: 0 .  magnesium oxide (MAG-OX) 400 MG tablet, Take 400 mg by mouth daily., Disp: , Rfl:  .  meclizine (ANTIVERT) 25 MG tablet, Take 25 mg by mouth 3 (three) times daily as needed for dizziness., Disp: , Rfl:  .  meloxicam (MOBIC) 15 MG tablet, TAKE 1 TABLET BY MOUTH EVERY DAY, Disp: 90 tablet, Rfl: 0 .  mirabegron ER (MYRBETRIQ) 50 MG TB24  tablet, Take 1 tablet (50 mg total) by mouth daily., Disp: 30 tablet, Rfl: 11 .  polyethylene glycol (MIRALAX / GLYCOLAX) 17 g packet, Take 17 g by mouth daily., Disp: , Rfl:  .  pregabalin (LYRICA) 50 MG capsule, Take 1 capsule (50 mg total) by mouth 3 (three) times daily., Disp: 90 capsule, Rfl: 0 .  rOPINIRole (REQUIP) 0.25 MG tablet, Take 1 tablet (0.25 mg total) by mouth at bedtime., Disp: 30 tablet, Rfl: 5 .  rosuvastatin (CRESTOR) 20 MG tablet, TAKE 1 TABLET BY MOUTH EVERY DAY, Disp: 90 tablet, Rfl: 0 .  SUMAtriptan (IMITREX) 100 MG tablet, Take as directed PRN migraines, Disp: , Rfl:  .  tiZANidine (ZANAFLEX) 2 MG tablet, TAKE 1 TABLET (2 MG TOTAL) BY MOUTH 3 (THREE) TIMES DAILY., Disp: , Rfl:  .  traZODone (DESYREL) 50 MG tablet, TAKE 1/2 TO 1 TABLET BY MOUTH AT BEDTIME AS NEEDED FOR SLEEP, Disp: 90 tablet, Rfl: 0 .  vitamin B-12 (CYANOCOBALAMIN) 1000 MCG tablet, Take 1,000 mcg  by mouth daily., Disp: , Rfl:  .  albuterol (VENTOLIN HFA) 108 (90 Base) MCG/ACT inhaler, , Disp: , Rfl:  .  erythromycin ophthalmic ointment, , Disp: , Rfl:  .  MURO 128 5 % ophthalmic solution, INSTILL 1 DROP IN RIGHT EYE THREE TIMES PER DAY (Patient not taking: Reported on 09/13/2020), Disp: , Rfl:    Family History  Problem Relation Age of Onset  . Heart disease Mother   . Diabetes Mother   . Kidney disease Mother   . Hypertension Mother   . Heart disease Father   . Hyperlipidemia Father   . Hypertension Father   . Heart disease Other   . Cancer Other        Breast  . Diabetes Other   . Hyperlipidemia Sister   . Hypertension Sister   . Kidney disease Sister   . Diabetes Sister   . Lung cancer Paternal Aunt   . Cancer Maternal Grandmother        breast     Social History   Tobacco Use  . Smoking status: Never Smoker  . Smokeless tobacco: Never Used  . Tobacco comment: smoking cessation materials not required  Vaping Use  . Vaping Use: Never used  Substance Use Topics  . Alcohol use: No      Alcohol/week: 0.0 standard drinks  . Drug use: No    Allergies as of 09/13/2020 - Review Complete 09/13/2020  Allergen Reaction Noted  . Fenofibric acid  04/20/2015  . Lipitor [atorvastatin]  04/20/2015  . Nsaids  04/20/2015    Review of Systems:    All systems reviewed and negative except where noted in HPI.   Physical Exam:  BP 114/71 (BP Location: Left Arm, Patient Position: Sitting, Cuff Size: Normal)   Pulse 75   Temp 98.1 F (36.7 C) (Oral)   Ht 5\' 8"  (1.727 m)   Wt 188 lb 6 oz (85.4 kg)   BMI 28.64 kg/m  No LMP recorded. Patient is postmenopausal.  General:   Alert,  Well-developed, well-nourished, pleasant and cooperative in NAD, patient is uncomfortable sitting due to low back pain Head:  Normocephalic and atraumatic. Eyes:  Sclera clear, no icterus.   Conjunctiva pink. Ears:  Normal auditory acuity. Nose:  No deformity, discharge, or lesions. Mouth:  No deformity or lesions,oropharynx pink & moist. Neck:  Supple; no masses or thyromegaly. Lungs:  Respirations even and unlabored.  Clear throughout to auscultation.   No wheezes, crackles, or rhonchi. No acute distress. Heart:  Regular rate and rhythm; no murmurs, clicks, rubs, or gallops. Abdomen:  Normal bowel sounds. Soft, severely distended, tympanic to percussion, mild generalized tenderness in the midabdomen, without masses, hepatosplenomegaly or hernias noted.  No guarding or rebound tenderness.   Rectal: Solid stool in the rectal vault Msk:  Symmetrical without gross deformities. Good, equal movement & strength bilaterally. Pulses:  Normal pulses noted. Extremities:  No clubbing or edema.  No cyanosis. Neurologic:  Alert and oriented x3;  grossly normal neurologically. Skin:  Intact without significant lesions or rashes. No jaundice. Psych:  Alert and cooperative. Normal mood and affect.  Imaging Studies: Reviewed  Assessment and Plan:   KALANDRA MASTERS is a 64 y.o. African-American female with  obesity, chronic constipation seen in for chronic abdominal pain associated with bloating  Chronic abdominal pain and bloating: Secondary to severe constipation CT A/P in 2018 was unremarkable except for 6.8 cm cavernous hemangioma in the liver which was benign and stable in size compared  to prior imaging. Trial of Linzess 290 MCG daily, samples provided.  Advised patient to call my office if Linzess works, to send in prescription Reiterated on adequate intake of water and high-fiber food  GERD: Well-controlled EGD normal On Dexilant 60 mg daily  Follow up as needed   Ashlee Darby, MD

## 2020-09-18 ENCOUNTER — Other Ambulatory Visit: Payer: Self-pay | Admitting: Family Medicine

## 2020-09-18 DIAGNOSIS — E785 Hyperlipidemia, unspecified: Secondary | ICD-10-CM

## 2020-09-18 NOTE — Telephone Encounter (Signed)
Requested Prescriptions  Pending Prescriptions Disp Refills  . rosuvastatin (CRESTOR) 20 MG tablet [Pharmacy Med Name: ROSUVASTATIN CALCIUM 20 MG TAB] 90 tablet 0    Sig: TAKE 1 TABLET BY MOUTH EVERY DAY     Cardiovascular:  Antilipid - Statins Failed - 09/18/2020  9:01 AM      Failed - Total Cholesterol in normal range and within 360 days    Cholesterol  Date Value Ref Range Status  10/23/2019 201 (H) <200 mg/dL Final         Failed - LDL in normal range and within 360 days    LDL Cholesterol (Calc)  Date Value Ref Range Status  10/23/2019 110 (H) mg/dL (calc) Final    Comment:    Reference range: <100 . Desirable range <100 mg/dL for primary prevention;   <70 mg/dL for patients with CHD or diabetic patients  with > or = 2 CHD risk factors. Marland Kitchen LDL-C is now calculated using the Martin-Hopkins  calculation, which is a validated novel method providing  better accuracy than the Friedewald equation in the  estimation of LDL-C.  Cresenciano Genre et al. Annamaria Helling. 1062;694(85): 2061-2068  (http://education.[facility].com/faq/FAQ164)          Passed - HDL in normal range and within 360 days    HDL  Date Value Ref Range Status  10/23/2019 68 > OR = 50 mg/dL Final         Passed - Triglycerides in normal range and within 360 days    Triglycerides  Date Value Ref Range Status  10/23/2019 115 <150 mg/dL Final         Passed - Patient is not pregnant      Passed - Valid encounter within last 12 months    Recent Outpatient Visits          8 months ago Angina pectoris [facility])   [facility] Steele Sizer, MD   9 months ago Frequency of urination   [facility] Steele Sizer, MD   11 months ago Frequency of urination   [facility] Steele Sizer, MD   1 year ago Urge incontinence of urine   [facility] Steele Sizer, MD   1 year ago Neck pain, chronic   [facility] Steele Sizer, MD      Future Appointments            In 2 days Steele Sizer, MD [facility], [place]   In 11 months  [facility], [facility]

## 2020-09-19 ENCOUNTER — Other Ambulatory Visit: Payer: Self-pay | Admitting: Family Medicine

## 2020-09-19 DIAGNOSIS — E785 Hyperlipidemia, unspecified: Secondary | ICD-10-CM

## 2020-09-19 DIAGNOSIS — I209 Angina pectoris, unspecified: Secondary | ICD-10-CM

## 2020-09-19 NOTE — Progress Notes (Signed)
Name: Ashlee Richardson   MRN: [id number]    DOB: 06-Nov-1956   Date:09/20/2020       Progress Note  Subjective  Chief Complaint  Chief Complaint  Patient presents with  . Medication Refill  . Breast Pain    Bilateral breast pain x 1 month. Pain is constant. She gets a mass underneath her left arm that comes and goes. Wants to be tested for BRCA gene. Has family hx with cousins.    HPI  MS: under the care of Dr. Manuella Ghazi , had a positive IG for herpes on spinal tap but was given reassurance by Dr. Ola Spurr. She denies weakness, but has intermittent tingling an numbness on both arms. Stable and not on medication. MRI showed white plaques. Shehas some tremors and sometimes decrease in memory, on duloxetine , she was given Aricept for her memory but she stopped taking it. Explained importance of compliance  Bilateral breast tenderness: symptoms started about one month ago, no lumps but she has intermittent axillary tender lymphadenopathy but states not present now. She had two cousins with breast cancer and is worried about it. Last mammogram was normal 11/2020   Chronic back pain and neck pain, also has FMS: used to see Dr. Primus Bravo, [facility] Dr. Lorin Mercy for back pain. She iscompliant with Duloxetine but stopped gabapentin and is not taking lyrica now . She had some PT but was given home exercises but she is not doing them now, advised her to resume it.   GERD and gastritis: seen by Dr. Marius Ditch, taking Dexilant and symptoms are controlled. She was diagnosed with hiatal hernia, she also has constipation, was on Miralax but was given some samples of linzess yesterday but has not started it yet    B12 deficiency and Vitamin D deficiency: on oral supplementation for vitamin Dand B12 and last levels at goal , it is time to recheck levels   Insomnia: she states Trazodone helps her sleep, no side effects, she does not like taking medications   RLS: taking Requip  from neurologist, discussed going up on the dose, but she is not interested on doing that, but not every night, she states medication seems to help with symptoms   Hyperglycemia: A1C was 6% , she denies polyphagia, polydipsia but she has urinary frequency , we will recheck levels today   Angina Pectoris: seen by Dr. Clayborn Bigness on statin therapy but states unable to take it daily because increases muscle aches, explained last LDL was not at goal, she is on Zetia and we will recheck levels . She still has episodes of chest pain, described as sharp on left side and intermittent throughout the day when it happens, she has some SOB with activity like walking to her car  FMS: she has aches and pains all day, 8/10 on duloxetine, could not tolerate gabapentin, she takes Tramadol to decrease pain at night, she stopped taking Lyrica again, she is worried about side effects   Mixed incontinence: she is under the care of Urologist , only taking Myrbetriq prn, again she does not want to take medications daily  Thumb pain: both hands, aching pain like, she is not taking medications at this time, advised brace and she already takes nsaid's for her back, meloxicam  Cramps: she states hands and feet, it is painful, resolves by itself. She takes magnesium and drinks plenty of water daily , discussed adding CoQ 10   Migraine headaches: she sees neurologist , she was  given an injectable medication but she stopped taking it, having a migraine episode today, did not take imitrex, Pain is like a band around her head, she has photophobia and phonophobia.    Patient Active Problem List   Diagnosis Date Noted  . Myalgia due to statin 05/26/2020  . History of lumbar fusion 11/22/2019  . Pre-diabetes 10/26/2019  . GERD (gastroesophageal reflux disease) 04/28/2018  . Pure hypercholesterolemia 04/28/2018  . Diverticulosis 04/28/2018  . RLS (restless legs syndrome) 04/28/2018  . Angina pectoris ([place]) 04/28/2018  .  Vitamin D deficiency 12/27/2017  . Chronic fatigue, unspecified 12/27/2017  . Intractable migraine without aura and without status migrainosus 10/17/2015  . DDD (degenerative disc disease), cervical 05/23/2015  . Bilateral occipital neuralgia 05/23/2015  . DDD (degenerative disc disease), lumbar 05/23/2015  . Multiple sclerosis ([place]) 05/23/2015  . Cervical disc disorder with radiculopathy of cervical region 04/20/2015  . Right arm numbness 04/20/2015  . Right arm weakness 04/20/2015    Past Surgical History:  Procedure Laterality Date  . BACK SURGERY  1991?   Pt usure of date  . BREAST BIOPSY Right   . COLONOSCOPY  09/2007  . COLONOSCOPY WITH PROPOFOL N/A 08/28/2017   Procedure: COLONOSCOPY WITH PROPOFOL;  Surgeon: Christene Lye, MD;  Location: [facility] ENDOSCOPY;  Service: Endoscopy;  Laterality: N/A;  . ESOPHAGOGASTRODUODENOSCOPY (EGD) WITH PROPOFOL N/A 11/24/2018   Procedure: ESOPHAGOGASTRODUODENOSCOPY (EGD) WITH PROPOFOL;  Surgeon: Lin Landsman, MD;  Location: [facility] ENDOSCOPY;  Service: Gastroenterology;  Laterality: N/A;  . Foot sugery Right    3rd toe from the right"    Family History  Problem Relation Age of Onset  . Heart disease Mother   . Diabetes Mother   . Kidney disease Mother   . Hypertension Mother   . Heart disease Father   . Hyperlipidemia Father   . Hypertension Father   . Heart disease Other   . Cancer Other        Breast  . Diabetes Other   . Hyperlipidemia Sister   . Hypertension Sister   . Kidney disease Sister   . Diabetes Sister   . Lung cancer Paternal Aunt   . Cancer Maternal Grandmother        breast    Social History   Tobacco Use  . Smoking status: Never Smoker  . Smokeless tobacco: Never Used  . Tobacco comment: smoking cessation materials not required  Substance Use Topics  . Alcohol use: No    Alcohol/week: 0.0 standard drinks     Current Outpatient Medications:  .  albuterol (VENTOLIN HFA) 108 (90 Base) MCG/ACT  inhaler, , Disp: , Rfl:  .  Calcium-Magnesium-Vitamin D 300-150-400 MG-MG-UNIT TABS, Take by mouth., Disp: , Rfl:  .  cholecalciferol (VITAMIN D3) 25 MCG (1000 UT) tablet, Take 1,000 Units by mouth daily., Disp: , Rfl:  .  DEXILANT 60 MG capsule, TAKE 1 CAPSULE BY MOUTH EVERY DAY, Disp: 90 capsule, Rfl: 0 .  Diclofenac-miSOPROStol 75-0.2 MG TBEC, Take by mouth., Disp: , Rfl:  .  donepezil (ARICEPT) 5 MG tablet, Take 1 tablet by mouth daily., Disp: , Rfl:  .  DULoxetine (CYMBALTA) 60 MG capsule, Take 60 mg by mouth daily., Disp: , Rfl:  .  erythromycin ophthalmic ointment, , Disp: , Rfl:  .  ezetimibe (ZETIA) 10 MG tablet, TAKE 1 TABLET BY MOUTH EVERY DAY, Disp: 90 tablet, Rfl: 1 .  fluticasone (FLONASE) 50 MCG/ACT nasal spray, SPRAY 1 SPRAY INTO EACH NOSTRIL EVERY DAY, Disp: ,  Rfl: 0 .  magnesium oxide (MAG-OX) 400 MG tablet, Take 400 mg by mouth daily., Disp: , Rfl:  .  meclizine (ANTIVERT) 25 MG tablet, Take 25 mg by mouth 3 (three) times daily as needed for dizziness., Disp: , Rfl:  .  meloxicam (MOBIC) 15 MG tablet, TAKE 1 TABLET BY MOUTH EVERY DAY, Disp: 90 tablet, Rfl: 0 .  mirabegron ER (MYRBETRIQ) 50 MG TB24 tablet, Take 1 tablet (50 mg total) by mouth daily., Disp: 30 tablet, Rfl: 11 .  MURO 128 5 % ophthalmic solution, INSTILL 1 DROP IN RIGHT EYE THREE TIMES PER DAY, Disp: , Rfl:  .  pregabalin (LYRICA) 50 MG capsule, Take 1 capsule (50 mg total) by mouth 3 (three) times daily., Disp: 90 capsule, Rfl: 0 .  rOPINIRole (REQUIP) 0.25 MG tablet, Take 1 tablet (0.25 mg total) by mouth at bedtime., Disp: 30 tablet, Rfl: 5 .  rosuvastatin (CRESTOR) 20 MG tablet, TAKE 1 TABLET BY MOUTH EVERY DAY, Disp: 90 tablet, Rfl: 0 .  SUMAtriptan (IMITREX) 100 MG tablet, Take as directed PRN migraines, Disp: , Rfl:  .  tiZANidine (ZANAFLEX) 2 MG tablet, TAKE 1 TABLET (2 MG TOTAL) BY MOUTH 3 (THREE) TIMES DAILY., Disp: , Rfl:  .  traZODone (DESYREL) 50 MG tablet, TAKE 1/2 TO 1 TABLET BY MOUTH AT BEDTIME AS  NEEDED FOR SLEEP, Disp: 90 tablet, Rfl: 0 .  vitamin B-12 (CYANOCOBALAMIN) 1000 MCG tablet, Take 1,000 mcg by mouth daily., Disp: , Rfl:  .  polyethylene glycol (MIRALAX / GLYCOLAX) 17 g packet, Take 17 g by mouth daily. (Patient not taking: Reported on 09/20/2020), Disp: , Rfl:   Allergies  Allergen Reactions  . Fenofibric Acid   . Lipitor [Atorvastatin]   . Nsaids     I personally reviewed active problem list, medication list, allergies, family history, social history, health maintenance with the patient/caregiver today.   ROS  Constitutional: Negative for fever or weight change.  Respiratory: Negative for cough and shortness of breath.   Cardiovascular: Negative for chest pain or palpitations.  Gastrointestinal: Negative for abdominal pain, no bowel changes.  Musculoskeletal: Negative for gait problem or joint swelling.  Skin: Negative for rash.  Neurological: Negative for dizziness or headache.  No other specific complaints in a complete review of systems (except as listed in HPI above).  Objective  Vitals:   09/20/20 1118  BP: (!) 146/60  Pulse: 73  Resp: 16  Temp: 98.4 F (36.9 C)  TempSrc: Oral  SpO2: 98%  Weight: 186 lb 12.8 oz (84.7 kg)  Height: _0  (1.727 m)    Body mass index is 28.4 kg/m.  Physical Exam  Constitutional: Patient appears well-developed and well-nourished. Overweight No distress.  HEENT: head atraumatic, normocephalic, pupils equal and reactive to light, neck supple Cardiovascular: Normal rate, regular rhythm and normal heart sounds.  No murmur heard. No BLE edema. Breast: tender, no rashes, nipple discharge or axillary lymphadenopathy Pulmonary/Chest: Effort normal and breath sounds normal. No respiratory distress. Abdominal: Soft.  There is no tenderness. Psychiatric: Patient has a normal mood and affect. behavior is normal. Judgment and thought content normal. Muscular Skeletal: tender with flexion of both thumbs and extension of  wrists. , trigger point positive   PHQ2/9: Depression screen [facility] 2/9 08/11/2020 01/08/2020 12/04/2019 10/23/2019 09/01/2019  Decreased Interest 0 2 0 0 0  Down, Depressed, Hopeless 1 2 0 0 0  PHQ - 2 Score 1 4 0 0 0  Altered sleeping 3 0 2 3 0  Tired, decreased energy _0 0  Change in appetite 0 0 2 0 0  Feeling bad or failure about yourself  0 0 0 0 0  Trouble concentrating 0 0 0 0 0  Moving slowly or fidgety/restless 0 0 0 0 0  Suicidal thoughts 0 0 0 0 0  PHQ-9 Score _1 0  Difficult doing work/chores Somewhat difficult Somewhat difficult Somewhat difficult Not difficult at all Not difficult at all  Some recent data might be hidden    phq 9 is positive   Fall Risk: Fall Risk  09/20/2020 08/11/2020 01/08/2020 12/04/2019 10/23/2019  Falls in the past year? 0 0 0 0 0  Number falls in past yr: 0 0 0 0 0  Injury with Fall? 0 0 0 0 0  Comment - - - - -  Risk Factor Category  - - - - -  Risk for fall due to : - No Fall Risks - - -  Risk for fall due to: Comment - - - - -  Follow up - Falls prevention discussed - - -      Assessment & Plan  1. Angina pectoris ([place])  Stable at this time   2. Dyslipidemia  - Lipid panel  3. Need for immunization against influenza  - Flu Vaccine QUAD 36+ mos IM  4. B12 deficiency  - Vitamin B12  5. Fibromyalgia  Seems to be worse, likely from change in weather  6. Vitamin D deficiency  - VITAMIN D 25 Hydroxy (Vit-D Deficiency, Fractures)  7. Multiple sclerosis ([place])  Under the care of neurologist   8. Psychophysiological insomnia  Taking medication prn only   9. Urge incontinence of urine   10. Hyperglycemia  - Hemoglobin A1c  11. Chronic bilateral low back pain with right-sided sciatica  Under the care of neurosurgeon   12. Migraine aura without headache  Sees Dr. Manuella Ghazi , not compliant with medication   13. Long-term use of high-risk medication  - COMPLETE METABOLIC PANEL WITH GFR - CBC with  Differential/Platelet  14. Breast tenderness  Reassurance given, no lumps, go back for mammogram in Dec, avoid caffeine

## 2020-09-19 NOTE — Telephone Encounter (Signed)
Requested Prescriptions  Pending Prescriptions Disp Refills  . ezetimibe (ZETIA) 10 MG tablet [Pharmacy Med Name: EZETIMIBE 10 MG TABLET] 90 tablet 1    Sig: TAKE 1 TABLET BY MOUTH EVERY DAY     Cardiovascular:  Antilipid - Sterol Transport Inhibitors Failed - 09/19/2020  1:30 AM      Failed - Total Cholesterol in normal range and within 360 days    Cholesterol  Date Value Ref Range Status  10/23/2019 201 (H) <200 mg/dL Final         Failed - LDL in normal range and within 360 days    LDL Cholesterol (Calc)  Date Value Ref Range Status  10/23/2019 110 (H) mg/dL (calc) Final    Comment:    Reference range: <100 . Desirable range <100 mg/dL for primary prevention;   <70 mg/dL for patients with CHD or diabetic patients  with > or = 2 CHD risk factors. Marland Kitchen LDL-C is now calculated using the Martin-Hopkins  calculation, which is a validated novel method providing  better accuracy than the Friedewald equation in the  estimation of LDL-C.  Cresenciano Genre et al. Annamaria Helling. 7124;580(99): 2061-2068  (http://education.[employer].com/faq/FAQ164)          Passed - HDL in normal range and within 360 days    HDL  Date Value Ref Range Status  10/23/2019 68 > OR = 50 mg/dL Final         Passed - Triglycerides in normal range and within 360 days    Triglycerides  Date Value Ref Range Status  10/23/2019 115 <150 mg/dL Final         Passed - Valid encounter within last 12 months    Recent Outpatient Visits          8 months ago Angina pectoris [facility])   [facility] Steele Sizer, MD   9 months ago Frequency of urination   [facility] Steele Sizer, MD   11 months ago Frequency of urination   [facility] Steele Sizer, MD   1 year ago Urge incontinence of urine   [facility] Steele Sizer, MD   1 year ago Neck pain, chronic   [facility] Steele Sizer, MD      Future Appointments             Tomorrow Steele Sizer, MD [facility], [place]   In 11 months  [facility], [facility]

## 2020-09-20 ENCOUNTER — Other Ambulatory Visit: Payer: Self-pay

## 2020-09-20 ENCOUNTER — Ambulatory Visit (INDEPENDENT_AMBULATORY_CARE_PROVIDER_SITE_OTHER): Payer: Medicare Other | Admitting: Family Medicine

## 2020-09-20 ENCOUNTER — Encounter: Payer: Self-pay | Admitting: Family Medicine

## 2020-09-20 VITALS — BP 146/60 | HR 73 | Temp 98.4°F | Resp 16 | Ht 68.0 in | Wt 186.8 lb

## 2020-09-20 DIAGNOSIS — R739 Hyperglycemia, unspecified: Secondary | ICD-10-CM | POA: Diagnosis not present

## 2020-09-20 DIAGNOSIS — M797 Fibromyalgia: Secondary | ICD-10-CM

## 2020-09-20 DIAGNOSIS — E785 Hyperlipidemia, unspecified: Secondary | ICD-10-CM

## 2020-09-20 DIAGNOSIS — Z23 Encounter for immunization: Secondary | ICD-10-CM | POA: Diagnosis not present

## 2020-09-20 DIAGNOSIS — E538 Deficiency of other specified B group vitamins: Secondary | ICD-10-CM | POA: Diagnosis not present

## 2020-09-20 DIAGNOSIS — N644 Mastodynia: Secondary | ICD-10-CM

## 2020-09-20 DIAGNOSIS — G35 Multiple sclerosis: Secondary | ICD-10-CM

## 2020-09-20 DIAGNOSIS — Z79899 Other long term (current) drug therapy: Secondary | ICD-10-CM

## 2020-09-20 DIAGNOSIS — N3941 Urge incontinence: Secondary | ICD-10-CM

## 2020-09-20 DIAGNOSIS — I209 Angina pectoris, unspecified: Secondary | ICD-10-CM

## 2020-09-20 DIAGNOSIS — G8929 Other chronic pain: Secondary | ICD-10-CM

## 2020-09-20 DIAGNOSIS — M5441 Lumbago with sciatica, right side: Secondary | ICD-10-CM

## 2020-09-20 DIAGNOSIS — E559 Vitamin D deficiency, unspecified: Secondary | ICD-10-CM | POA: Diagnosis not present

## 2020-09-20 DIAGNOSIS — G43109 Migraine with aura, not intractable, without status migrainosus: Secondary | ICD-10-CM

## 2020-09-20 DIAGNOSIS — F5104 Psychophysiologic insomnia: Secondary | ICD-10-CM

## 2020-09-21 LAB — COMPLETE METABOLIC PANEL WITH GFR
AG Ratio: 2 (calc) (ref 1.0–2.5)
ALT: 21 U/L (ref 6–29)
AST: 16 U/L (ref 10–35)
Albumin: 4.5 g/dL (ref 3.6–5.1)
Alkaline phosphatase (APISO): 58 U/L (ref 37–153)
BUN: 12 mg/dL (ref 7–25)
CO2: 31 mmol/L (ref 20–32)
Calcium: 10 mg/dL (ref 8.6–10.4)
Chloride: 105 mmol/L (ref 98–110)
Creat: 0.64 mg/dL (ref 0.50–0.99)
GFR, Est African American: 110 mL/min/{1.73_m2} (ref >=60)
GFR, Est Non African American: 95 mL/min/{1.73_m2} (ref >=60)
Globulin: 2.2 g/dL (calc) (ref 1.9–3.7)
Glucose, Bld: 117 mg/dL — ABNORMAL HIGH (ref 65–99)
Potassium: 4.5 mmol/L (ref 3.5–5.3)
Sodium: 142 mmol/L (ref 135–146)
Total Bilirubin: 0.5 mg/dL (ref 0.2–1.2)
Total Protein: 6.7 g/dL (ref 6.1–8.1)

## 2020-09-21 LAB — CBC WITH DIFFERENTIAL/PLATELET
Absolute Monocytes: 410 cells/uL (ref 200–950)
Basophils Absolute: 60 cells/uL (ref 0–200)
Basophils Relative: 1.2 %
Eosinophils Absolute: 230 cells/uL (ref 15–500)
Eosinophils Relative: 4.6 %
HCT: 39.9 % (ref 35.0–45.0)
Hemoglobin: 12.5 g/dL (ref 11.7–15.5)
Lymphs Abs: 1890 cells/uL (ref 850–3900)
MCH: 26.7 pg — ABNORMAL LOW (ref 27.0–33.0)
MCHC: 31.3 g/dL — ABNORMAL LOW (ref 32.0–36.0)
MCV: 85.1 fL (ref 80.0–100.0)
MPV: 12 fL (ref 7.5–12.5)
Monocytes Relative: 8.2 %
Neutro Abs: 2410 cells/uL (ref 1500–7800)
Neutrophils Relative %: 48.2 %
Platelets: 195 10*3/uL (ref 140–400)
RBC: 4.69 10*6/uL (ref 3.80–5.10)
RDW: 12.4 % (ref 11.0–15.0)
Total Lymphocyte: 37.8 %
WBC: 5 10*3/uL (ref 3.8–10.8)

## 2020-09-21 LAB — LIPID PANEL
Cholesterol: 164 mg/dL (ref <200)
HDL: 78 mg/dL (ref >=50)
LDL Cholesterol (Calc): 63 mg/dL (calc)
Non-HDL Cholesterol (Calc): 86 mg/dL (calc) (ref <130)
Total CHOL/HDL Ratio: 2.1 (calc) (ref <5.0)
Triglycerides: 147 mg/dL (ref <150)

## 2020-09-21 LAB — HEMOGLOBIN A1C
Hgb A1c MFr Bld: 6.6 % of total Hgb — ABNORMAL HIGH (ref <5.7)
Mean Plasma Glucose: 143 (calc)
eAG (mmol/L): 7.9 (calc)

## 2020-09-21 LAB — VITAMIN B12: Vitamin B-12: 1191 pg/mL — ABNORMAL HIGH (ref 200–1100)

## 2020-09-21 LAB — VITAMIN D 25 HYDROXY (VIT D DEFICIENCY, FRACTURES): Vit D, 25-Hydroxy: 33 ng/mL (ref 30–100)

## 2020-09-28 ENCOUNTER — Ambulatory Visit: Payer: Medicare Other | Admitting: Podiatry

## 2020-10-17 ENCOUNTER — Other Ambulatory Visit: Payer: Self-pay | Admitting: Podiatry

## 2020-10-17 ENCOUNTER — Ambulatory Visit (INDEPENDENT_AMBULATORY_CARE_PROVIDER_SITE_OTHER): Payer: Medicare Other

## 2020-10-17 ENCOUNTER — Other Ambulatory Visit: Payer: Self-pay

## 2020-10-17 ENCOUNTER — Ambulatory Visit (INDEPENDENT_AMBULATORY_CARE_PROVIDER_SITE_OTHER): Payer: Medicare Other | Admitting: Podiatry

## 2020-10-17 ENCOUNTER — Encounter: Payer: Self-pay | Admitting: Podiatry

## 2020-10-17 DIAGNOSIS — S9032XA Contusion of left foot, initial encounter: Secondary | ICD-10-CM

## 2020-10-17 DIAGNOSIS — M2041 Other hammer toe(s) (acquired), right foot: Secondary | ICD-10-CM | POA: Diagnosis not present

## 2020-10-17 DIAGNOSIS — L603 Nail dystrophy: Secondary | ICD-10-CM

## 2020-10-17 DIAGNOSIS — B351 Tinea unguium: Secondary | ICD-10-CM | POA: Diagnosis not present

## 2020-10-17 DIAGNOSIS — S93402A Sprain of unspecified ligament of left ankle, initial encounter: Secondary | ICD-10-CM

## 2020-10-17 DIAGNOSIS — M778 Other enthesopathies, not elsewhere classified: Secondary | ICD-10-CM

## 2020-10-17 NOTE — Progress Notes (Signed)
Subjective:  Patient ID: Ashlee Richardson, female    DOB: [birth year],  MRN: [id number] HPI Chief Complaint  Patient presents with  . Foot Injury    Anterior ankle left - got foot caught under electric wheelchair and bent it back x 2-3 months ago, said had chipped bone, but not getting better, swelling  . Foot Pain    Dorsal forefoot right - previous surgery x years ago, has had pain off and on since, notice toes spread  . Nail Problem    Toenails bilateral - thick and dark x years, doc gave a pill to take but didn't help  . New Patient (Initial Visit)    64 y.o. female presents with the above complaint.   ROS: Denies fever chills nausea vomiting muscle aches pains calf pain back pain chest pain shortness of breath.  Past Medical History:  Diagnosis Date  . B12 deficiency 12/27/2017  . Diverticulitis   . Fibromyalgia   . GERD (gastroesophageal reflux disease)   . High cholesterol   . Migraine   . MS (multiple sclerosis) ([place])   . PUD (peptic ulcer disease) 04/28/2018   Past Surgical History:  Procedure Laterality Date  . BACK SURGERY  1991?   Pt usure of date  . BREAST BIOPSY Right   . COLONOSCOPY  09/2007  . COLONOSCOPY WITH PROPOFOL N/A 08/28/2017   Procedure: COLONOSCOPY WITH PROPOFOL;  Surgeon: Christene Lye, MD;  Location: [facility] ENDOSCOPY;  Service: Endoscopy;  Laterality: N/A;  . ESOPHAGOGASTRODUODENOSCOPY (EGD) WITH PROPOFOL N/A 11/24/2018   Procedure: ESOPHAGOGASTRODUODENOSCOPY (EGD) WITH PROPOFOL;  Surgeon: Lin Landsman, MD;  Location: [facility] ENDOSCOPY;  Service: Gastroenterology;  Laterality: N/A;  . Foot sugery Right    3rd toe from the right"    Current Outpatient Medications:  .  Calcium-Magnesium-Vitamin D 300-150-400 MG-MG-UNIT TABS, Take by mouth., Disp: , Rfl:  .  cholecalciferol (VITAMIN D3) 25 MCG (1000 UT) tablet, Take 1,000 Units by mouth daily., Disp: , Rfl:  .  DEXILANT 60 MG capsule, TAKE 1 CAPSULE BY MOUTH EVERY DAY, Disp: 90 capsule,  Rfl: 0 .  Diclofenac-miSOPROStol 75-0.2 MG TBEC, Take by mouth., Disp: , Rfl:  .  donepezil (ARICEPT) 5 MG tablet, Take 1 tablet by mouth daily., Disp: , Rfl:  .  DULoxetine (CYMBALTA) 60 MG capsule, Take 60 mg by mouth daily., Disp: , Rfl:  .  erythromycin ophthalmic ointment, , Disp: , Rfl:  .  ezetimibe (ZETIA) 10 MG tablet, TAKE 1 TABLET BY MOUTH EVERY DAY, Disp: 90 tablet, Rfl: 1 .  fluticasone (FLONASE) 50 MCG/ACT nasal spray, SPRAY 1 SPRAY INTO EACH NOSTRIL EVERY DAY, Disp: , Rfl: 0 .  magnesium oxide (MAG-OX) 400 MG tablet, Take 400 mg by mouth daily., Disp: , Rfl:  .  meclizine (ANTIVERT) 25 MG tablet, Take 25 mg by mouth 3 (three) times daily as needed for dizziness., Disp: , Rfl:  .  meloxicam (MOBIC) 15 MG tablet, TAKE 1 TABLET BY MOUTH EVERY DAY, Disp: 90 tablet, Rfl: 0 .  mirabegron ER (MYRBETRIQ) 50 MG TB24 tablet, Take 1 tablet (50 mg total) by mouth daily., Disp: 30 tablet, Rfl: 11 .  MURO 128 5 % ophthalmic solution, INSTILL 1 DROP IN RIGHT EYE THREE TIMES PER DAY, Disp: , Rfl:  .  pregabalin (LYRICA) 50 MG capsule, Take 1 capsule (50 mg total) by mouth 3 (three) times daily., Disp: 90 capsule, Rfl: 0 .  rOPINIRole (REQUIP) 0.25 MG tablet, Take 1 tablet (0.25 mg total) by mouth  at bedtime., Disp: 30 tablet, Rfl: 5 .  rosuvastatin (CRESTOR) 20 MG tablet, TAKE 1 TABLET BY MOUTH EVERY DAY, Disp: 90 tablet, Rfl: 0 .  SUMAtriptan (IMITREX) 100 MG tablet, Take as directed PRN migraines, Disp: , Rfl:  .  tiZANidine (ZANAFLEX) 2 MG tablet, TAKE 1 TABLET (2 MG TOTAL) BY MOUTH 3 (THREE) TIMES DAILY., Disp: , Rfl:  .  traZODone (DESYREL) 50 MG tablet, TAKE 1/2 TO 1 TABLET BY MOUTH AT BEDTIME AS NEEDED FOR SLEEP, Disp: 90 tablet, Rfl: 0 .  vitamin B-12 (CYANOCOBALAMIN) 1000 MCG tablet, Take 1,000 mcg by mouth daily., Disp: , Rfl:   Allergies  Allergen Reactions  . Fenofibric Acid   . Lipitor [Atorvastatin]   . Nsaids    Review of Systems Objective:  There were no vitals filed for  this visit.  General: Well developed, nourished, in no acute distress, alert and oriented x3   Dermatological: Skin is warm, dry and supple bilateral. Nails x 10 are thick yellow dystrophic clinically mycotic.  Remaining integument appears unremarkable at this time. There are no open sores, no preulcerative lesions, no rash or signs of infection present.  Vascular: Dorsalis Pedis artery and Posterior Tibial artery pedal pulses are 2/4 bilateral with immedate capillary fill time. Pedal hair growth present. No varicosities and no lower extremity edema present bilateral.   Neruologic: Grossly intact via light touch bilateral. Vibratory intact via tuning fork bilateral. Protective threshold with Semmes Wienstein monofilament intact to all pedal sites bilateral. Patellar and Achilles deep tendon reflexes 2+ bilateral. No Babinski or clonus noted bilateral.   Musculoskeletal: No gross boney pedal deformities bilateral. No pain, crepitus, or limitation noted with foot and ankle range of motion bilateral. Muscular strength 5/5 in all groups tested bilateral.  Hammertoe deformities #2 #3 #4 the right foot rigidity at the PIPJ.  Left foot does demonstrate some posterior tibial tendinitis with mild edema and fluctuance within the tendon sheath.  She also has pain on palpation to the extensor tendons which are fully functional left.  And tenderness on palpation over the anterior talofibular ligament left.  She has guarding with range of motion.  She has a normal Achilles.  Gait: Unassisted, Nonantalgic.    Radiographs:  Radiographs demonstrated osseously mature individual with no acute findings left foot other than some mild edema anterior ankle.  Right foot demonstrates some osteoarthritic changes at the PIPJ's second third and fourth toes  Assessment & Plan:   Assessment: Stress injury to the left foot and ankle.  Nail dystrophy bilaterally  Plan: Encouraged her to wear the brace that she already has  debrided the toenails today sent in for pathologic evaluation I will follow-up with her in 1 month to discuss.  If her left foot is not feeling better by January we need to do an MRI.     Enrigue Hashimi T. [place], [place]

## 2020-11-07 ENCOUNTER — Encounter: Payer: Self-pay | Admitting: *Deleted

## 2020-11-14 ENCOUNTER — Encounter: Payer: Medicare Other | Admitting: Podiatry

## 2020-11-28 ENCOUNTER — Other Ambulatory Visit: Payer: Self-pay

## 2020-11-28 ENCOUNTER — Ambulatory Visit (INDEPENDENT_AMBULATORY_CARE_PROVIDER_SITE_OTHER): Payer: Medicare Other | Admitting: Podiatry

## 2020-11-28 ENCOUNTER — Encounter: Payer: Self-pay | Admitting: Podiatry

## 2020-11-28 DIAGNOSIS — S93402D Sprain of unspecified ligament of left ankle, subsequent encounter: Secondary | ICD-10-CM | POA: Diagnosis not present

## 2020-11-28 DIAGNOSIS — L603 Nail dystrophy: Secondary | ICD-10-CM | POA: Diagnosis not present

## 2020-11-28 MED ORDER — TERBINAFINE HCL 250 MG PO TABS: 250.0000 mg | ORAL_TABLET | Freq: Every day | ORAL | 0 refills | Status: DC

## 2020-11-28 NOTE — Patient Instructions (Signed)
Terbinafine tablets What is this medicine? TERBINAFINE (TER bin a feen) is an antifungal medicine. It is used to treat certain kinds of fungal or yeast infections. This medicine may be used for other purposes; ask your health care provider or pharmacist if you have questions. COMMON BRAND NAME(S): Lamisil, Terbinex What should I tell my health care provider before I take this medicine? They need to know if you have any of these conditions:  drink alcoholic beverages  kidney disease  liver disease  an unusual or allergic reaction to terbinafine, other medicines, foods, dyes, or preservatives  pregnant or trying to get pregnant  breast-feeding How should I use this medicine? Take this medicine by mouth with a full glass of water. Follow the directions on the prescription label. You can take this medicine with food or on an empty stomach. Take your medicine at regular intervals. Do not take your medicine more often than directed. Do not skip doses or stop your medicine early even if you feel better. Do not stop taking except on your doctor's advice. Talk to your pediatrician regarding the use of this medicine in children. Special care may be needed. Overdosage: If you think you have taken too much of this medicine contact a poison control center or emergency room at once. NOTE: This medicine is only for you. Do not share this medicine with others. What if I miss a dose? If you miss a dose, take it as soon as you can. If it is almost time for your next dose, take only that dose. Do not take double or extra doses. What may interact with this medicine? Do not take this medicine with any of the following medications:  thioridazine This medicine may also interact with the following medications:  beta-blockers  caffeine  cimetidine  cyclosporine  medicines for depression, anxiety, or psychotic disturbances  medicines for fungal infections like fluconazole and ketoconazole  medicines  for irregular heartbeat like amiodarone, flecainide and propafenone  rifampin  warfarin This list may not describe all possible interactions. Give your health care provider a list of all the medicines, herbs, non-prescription drugs, or dietary supplements you use. Also tell them if you smoke, drink alcohol, or use illegal drugs. Some items may interact with your medicine. What should I watch for while using this medicine? Visit your doctor or health care provider regularly. Tell your doctor right away if you have nausea or vomiting, loss of appetite, stomach pain on your right upper side, yellow skin, dark urine, light stools, or are over tired. Some fungal infections need many weeks or months of treatment to cure. If you are taking this medicine for a long time, you will need to have important blood work done. This medicine may cause serious skin reactions. They can happen weeks to months after starting the medicine. Contact your health care provider right away if you notice fevers or flu-like symptoms with a rash. The rash may be red or purple and then turn into blisters or peeling of the skin. Or, you might notice a red rash with swelling of the face, lips or lymph nodes in your neck or under your arms. What side effects may I notice from receiving this medicine? Side effects that you should report to your doctor or health care professional as soon as possible:  allergic reactions like skin rash or hives, swelling of the face, lips, or tongue  changes in vision  dark urine  fever or infection  general ill feeling or flu-like symptoms    light-colored stools  loss of appetite, nausea  rash, fever, and swollen lymph nodes  redness, blistering, peeling or loosening of the skin, including inside the mouth  right upper belly pain  unusually weak or tired  yellowing of the eyes or skin Side effects that usually do not require medical attention (report to your doctor or health care  professional if they continue or are bothersome):  changes in taste  diarrhea  hair loss  muscle or joint pain  stomach gas  stomach upset This list may not describe all possible side effects. Call your doctor for medical advice about side effects. You may report side effects to FDA at 1-800-FDA-1088. Where should I keep my medicine? Keep out of the reach of children. Store at room temperature below 25 degrees C (77 degrees F). Protect from light. Throw away any unused medicine after the expiration date. NOTE: This sheet is a summary. It may not cover all possible information. If you have questions about this medicine, talk to your doctor, pharmacist, or health care provider.  2020 Elsevier/Gold Standard (2019-03-20 15:37:07)  

## 2020-11-28 NOTE — Progress Notes (Signed)
She presents today for follow-up of her stress injury to her left foot.  She states that it seems to be doing much better I get a pain every once in a while but all in all it seems to be much improved.  She is also here for pathology.  Objective: Vital signs are stable she is alert and oriented x3.  Pulses are palpable.  At this point pathology did not demonstrate onychomycosis.  No reproducible pain.  Assessment: Resolving contusion and pain in the foot.  At this point onychomycosis is present.  Plan: Started her on oral therapy she has recently had blood work which looks normal.  Start her on Lamisil 250 mg tablets 1 tablet p.o. daily

## 2020-12-14 ENCOUNTER — Encounter: Payer: Self-pay | Admitting: Family Medicine

## 2020-12-14 ENCOUNTER — Ambulatory Visit (INDEPENDENT_AMBULATORY_CARE_PROVIDER_SITE_OTHER): Payer: Medicare Other | Admitting: Family Medicine

## 2020-12-14 ENCOUNTER — Other Ambulatory Visit: Payer: Self-pay

## 2020-12-14 VITALS — BP 118/74 | HR 79 | Temp 98.1°F | Resp 16 | Ht 68.0 in | Wt 184.8 lb

## 2020-12-14 DIAGNOSIS — Z1231 Encounter for screening mammogram for malignant neoplasm of breast: Secondary | ICD-10-CM | POA: Diagnosis not present

## 2020-12-14 DIAGNOSIS — G2581 Restless legs syndrome: Secondary | ICD-10-CM

## 2020-12-14 DIAGNOSIS — M797 Fibromyalgia: Secondary | ICD-10-CM | POA: Diagnosis not present

## 2020-12-14 MED ORDER — PREGABALIN 50 MG PO CAPS: 50.0000 mg | ORAL_CAPSULE | Freq: Every evening | ORAL | 2 refills | Status: DC

## 2020-12-14 NOTE — Progress Notes (Signed)
Name: Ashlee Richardson   MRN: [id number]    DOB: 05-31-1956   Date:12/14/2020       Progress Note  Subjective  Chief Complaint  Acute visit for legs and feet cramps   HPI  RLS: she states over the past month she has noticed increase of leg cramps and pain at night , it happens as soon as she goes to bed at night. It keeps her up all night. She has not been taking Lyrica or Requip. Explained that is likely why her symptoms are worse. We will resume Lyrica first, and since symptoms only at night we will start at 50 and increase to 100 mg after the first week. Also discussed stretching before going to bed. Reviewed recent labs with patient   Pre-diabetes: last A1C above 6.5, discussed importance of changing her diet and if remains above 6.5 % by her next visit we will need to change diagnosis to DM.She understands  Patient Active Problem List   Diagnosis Date Noted  . Myalgia due to statin 05/26/2020  . History of lumbar fusion 11/22/2019  . Pre-diabetes 10/26/2019  . GERD (gastroesophageal reflux disease) 04/28/2018  . Pure hypercholesterolemia 04/28/2018  . Diverticulosis 04/28/2018  . RLS (restless legs syndrome) 04/28/2018  . Angina pectoris ([place]) 04/28/2018  . Vitamin D deficiency 12/27/2017  . Chronic fatigue, unspecified 12/27/2017  . Intractable migraine without aura and without status migrainosus 10/17/2015  . DDD (degenerative disc disease), cervical 05/23/2015  . Bilateral occipital neuralgia 05/23/2015  . DDD (degenerative disc disease), lumbar 05/23/2015  . Multiple sclerosis ([place]) 05/23/2015  . Cervical disc disorder with radiculopathy of cervical region 04/20/2015  . Right arm numbness 04/20/2015  . Right arm weakness 04/20/2015    Past Surgical History:  Procedure Laterality Date  . BACK SURGERY  1991?   Pt usure of date  . BREAST BIOPSY Right   . COLONOSCOPY  09/2007  . COLONOSCOPY WITH PROPOFOL N/A 08/28/2017   Procedure: COLONOSCOPY WITH PROPOFOL;  Surgeon:  Christene Lye, MD;  Location: [facility] ENDOSCOPY;  Service: Endoscopy;  Laterality: N/A;  . ESOPHAGOGASTRODUODENOSCOPY (EGD) WITH PROPOFOL N/A 11/24/2018   Procedure: ESOPHAGOGASTRODUODENOSCOPY (EGD) WITH PROPOFOL;  Surgeon: Lin Landsman, MD;  Location: [facility] ENDOSCOPY;  Service: Gastroenterology;  Laterality: N/A;  . Foot sugery Right    3rd toe from the right"    Family History  Problem Relation Age of Onset  . Heart disease Mother   . Diabetes Mother   . Kidney disease Mother   . Hypertension Mother   . Heart disease Father   . Hyperlipidemia Father   . Hypertension Father   . Heart disease Other   . Cancer Other        Breast  . Diabetes Other   . Hyperlipidemia Sister   . Hypertension Sister   . Kidney disease Sister   . Diabetes Sister   . Lung cancer Paternal Aunt   . Cancer Maternal Grandmother        breast    Social History   Tobacco Use  . Smoking status: Never Smoker  . Smokeless tobacco: Never Used  . Tobacco comment: smoking cessation materials not required  Substance Use Topics  . Alcohol use: No    Alcohol/week: 0.0 standard drinks     Current Outpatient Medications:  .  Calcium-Magnesium-Vitamin D 300-150-400 MG-MG-UNIT TABS, Take by mouth., Disp: , Rfl:  .  cholecalciferol (VITAMIN D3) 25 MCG (1000 UT) tablet, Take 1,000 Units by mouth daily., Disp: ,  Rfl:  .  DEXILANT 60 MG capsule, TAKE 1 CAPSULE BY MOUTH EVERY DAY, Disp: 90 capsule, Rfl: 0 .  Diclofenac-miSOPROStol 75-0.2 MG TBEC, Take by mouth., Disp: , Rfl:  .  donepezil (ARICEPT) 5 MG tablet, Take 1 tablet by mouth daily., Disp: , Rfl:  .  DULoxetine (CYMBALTA) 60 MG capsule, Take 60 mg by mouth daily., Disp: , Rfl:  .  erythromycin ophthalmic ointment, , Disp: , Rfl:  .  ezetimibe (ZETIA) 10 MG tablet, TAKE 1 TABLET BY MOUTH EVERY DAY, Disp: 90 tablet, Rfl: 1 .  magnesium oxide (MAG-OX) 400 MG tablet, Take 400 mg by mouth daily., Disp: , Rfl:  .  meclizine (ANTIVERT) 25 MG tablet,  Take 25 mg by mouth 3 (three) times daily as needed for dizziness., Disp: , Rfl:  .  meloxicam (MOBIC) 15 MG tablet, TAKE 1 TABLET BY MOUTH EVERY DAY, Disp: 90 tablet, Rfl: 0 .  mirabegron ER (MYRBETRIQ) 50 MG TB24 tablet, Take 1 tablet (50 mg total) by mouth daily., Disp: 30 tablet, Rfl: 11 .  MURO 128 5 % ophthalmic solution, INSTILL 1 DROP IN RIGHT EYE THREE TIMES PER DAY, Disp: , Rfl:  .  pregabalin (LYRICA) 50 MG capsule, Take 1 capsule (50 mg total) by mouth 3 (three) times daily., Disp: 90 capsule, Rfl: 0 .  rosuvastatin (CRESTOR) 20 MG tablet, TAKE 1 TABLET BY MOUTH EVERY DAY, Disp: 90 tablet, Rfl: 0 .  SUMAtriptan (IMITREX) 100 MG tablet, Take as directed PRN migraines, Disp: , Rfl:  .  terbinafine (LAMISIL) 250 MG tablet, Take 1 tablet (250 mg total) by mouth daily., Disp: 30 tablet, Rfl: 0 .  tiZANidine (ZANAFLEX) 2 MG tablet, TAKE 1 TABLET (2 MG TOTAL) BY MOUTH 3 (THREE) TIMES DAILY., Disp: , Rfl:  .  traZODone (DESYREL) 50 MG tablet, TAKE 1/2 TO 1 TABLET BY MOUTH AT BEDTIME AS NEEDED FOR SLEEP, Disp: 90 tablet, Rfl: 0 .  vitamin B-12 (CYANOCOBALAMIN) 1000 MCG tablet, Take 1,000 mcg by mouth daily., Disp: , Rfl:  .  fluticasone (FLONASE) 50 MCG/ACT nasal spray, SPRAY 1 SPRAY INTO EACH NOSTRIL EVERY DAY (Patient not taking: Reported on 12/14/2020), Disp: , Rfl: 0  Allergies  Allergen Reactions  . Fenofibric Acid   . Lipitor [Atorvastatin]   . Nsaids     I personally reviewed active problem list, medication list, allergies with the patient/caregiver today.   ROS  Ten systems reviewed and is negative except as mentioned in HPI   Objective  Vitals:   12/14/20 1427  BP: 118/74  Pulse: 79  Resp: 16  Temp: 98.1 F (36.7 C)  TempSrc: Oral  SpO2: 96%  Weight: 184 lb 12.8 oz (83.8 kg)  Height: 5\' 8"  (1.727 m)    Body mass index is 28.1 kg/m.  Physical Exam  Constitutional: Patient appears well-developed and well-nourished. Overweight.  No distress.  HEENT: head  atraumatic, normocephalic, pupils equal and reactive to light,  neck supple Cardiovascular: Normal rate, regular rhythm and normal heart sounds.  No murmur heard. No BLE edema. Pulmonary/Chest: Effort normal and breath sounds normal. No respiratory distress. Abdominal: Soft.  There is no tenderness. Muscular Skeletal: pain on lumbar spine, antalgic gait, normal distal pulses  Psychiatric: Patient has a normal mood and affect. behavior is normal. Judgment and thought content normal.  Recent Results (from the past 2160 hour(s))  Lipid panel     Status: None   Collection Time: 09/20/20 11:45 AM  Result Value Ref Range   Cholesterol  164 <200 mg/dL   HDL 78 > OR = 50 mg/dL   Triglycerides 147 <150 mg/dL   LDL Cholesterol (Calc) 63 mg/dL (calc)    Comment: Reference range: <100 . Desirable range <100 mg/dL for primary prevention;   <70 mg/dL for patients with CHD or diabetic patients  with > or = 2 CHD risk factors. Marland Kitchen LDL-C is now calculated using the Martin-Hopkins  calculation, which is a validated novel method providing  better accuracy than the Friedewald equation in the  estimation of LDL-C.  Cresenciano Genre et al. Annamaria Helling. 9371;696(78): 2061-2068  (http://education.[facility].com/faq/FAQ164)    Total CHOL/HDL Ratio 2.1 <5.0 (calc)   Non-HDL Cholesterol (Calc) 86 <130 mg/dL (calc)    Comment: For patients with diabetes plus 1 major ASCVD risk  factor, treating to a non-HDL-C goal of <100 mg/dL  (LDL-C of <70 mg/dL) is considered a therapeutic  option.   COMPLETE METABOLIC PANEL WITH GFR     Status: Abnormal   Collection Time: 09/20/20 11:45 AM  Result Value Ref Range   Glucose, Bld 117 (H) 65 - 99 mg/dL    Comment: .            Fasting reference interval . For someone without known diabetes, a glucose value between 100 and 125 mg/dL is consistent with prediabetes and should be confirmed with a follow-up test. .    BUN 12 7 - 25 mg/dL   Creat 0.64 0.50 - 0.99 mg/dL     Comment: For patients >52 years of age, the reference limit for Creatinine is approximately 13% higher for people identified as African-American. .    GFR, Est Non African American 95 > OR = 60 mL/min/1.55m2   GFR, Est African American 110 > OR = 60 mL/min/1.22m2   BUN/Creatinine Ratio NOT APPLICABLE 6 - 22 (calc)   Sodium 142 135 - 146 mmol/L   Potassium 4.5 3.5 - 5.3 mmol/L   Chloride 105 98 - 110 mmol/L   CO2 31 20 - 32 mmol/L   Calcium 10.0 8.6 - 10.4 mg/dL   Total Protein 6.7 6.1 - 8.1 g/dL   Albumin 4.5 3.6 - 5.1 g/dL   Globulin 2.2 1.9 - 3.7 g/dL (calc)   AG Ratio 2.0 1.0 - 2.5 (calc)   Total Bilirubin 0.5 0.2 - 1.2 mg/dL   Alkaline phosphatase (APISO) 58 37 - 153 U/L   AST 16 10 - 35 U/L   ALT 21 6 - 29 U/L  CBC with Differential/Platelet     Status: Abnormal   Collection Time: 09/20/20 11:45 AM  Result Value Ref Range   WBC 5.0 3.8 - 10.8 Thousand/uL   RBC 4.69 3.80 - 5.10 Million/uL   Hemoglobin 12.5 11.7 - 15.5 g/dL   HCT 39.9 35.0 - 45.0 %   MCV 85.1 80.0 - 100.0 fL   MCH 26.7 (L) 27.0 - 33.0 pg   MCHC 31.3 (L) 32.0 - 36.0 g/dL   RDW 12.4 11.0 - 15.0 %   Platelets 195 140 - 400 Thousand/uL   MPV 12.0 7.5 - 12.5 fL   Neutro Abs 2,410 1,500 - 7,800 cells/uL   Lymphs Abs 1,890 850 - 3,900 cells/uL   Absolute Monocytes 410 200 - 950 cells/uL   Eosinophils Absolute 230 15 - 500 cells/uL   Basophils Absolute 60 0 - 200 cells/uL   Neutrophils Relative % 48.2 %   Total Lymphocyte 37.8 %   Monocytes Relative 8.2 %   Eosinophils Relative 4.6 %   Basophils  Relative 1.2 %  Hemoglobin A1c     Status: Abnormal   Collection Time: 09/20/20 11:45 AM  Result Value Ref Range   Hgb A1c MFr Bld 6.6 (H) <5.7 % of total Hgb    Comment: For someone without known diabetes, a hemoglobin A1c value of 6.5% or greater indicates that they may have  diabetes and this should be confirmed with a follow-up  test. . For someone with known diabetes, a value <7% indicates  that their  diabetes is well controlled and a value  greater than or equal to 7% indicates suboptimal  control. A1c targets should be individualized based on  duration of diabetes, age, comorbid conditions, and  other considerations. . Currently, no consensus exists regarding use of hemoglobin A1c for diagnosis of diabetes for children. .    Mean Plasma Glucose 143 (calc)   eAG (mmol/L) 7.9 (calc)  VITAMIN D 25 Hydroxy (Vit-D Deficiency, Fractures)     Status: None   Collection Time: 09/20/20 11:45 AM  Result Value Ref Range   Vit D, 25-Hydroxy 33 30 - 100 ng/mL    Comment: Vitamin D Status         25-OH Vitamin D: . Deficiency:                    <20 ng/mL Insufficiency:             20 - 29 ng/mL Optimal:                 > or = 30 ng/mL . For 25-OH Vitamin D testing on patients on  D2-supplementation and patients for whom quantitation  of D2 and D3 fractions is required, the [facility](TM) 25-OH VIT D, (D2,D3), LC/MS/MS is recommended: order  code [id number] (patients >57yrs). See Note 1 . Note 1 . For additional information, please refer to  http://education.[facility].com/faq/FAQ199  (This link is being provided for informational/ educational purposes only.)   Vitamin B12     Status: Abnormal   Collection Time: 09/20/20 11:45 AM  Result Value Ref Range   Vitamin B-12 1,191 (H) 200 - 1,100 pg/mL      PHQ2/9: Depression screen [facility] 2/9 12/14/2020 08/11/2020 01/08/2020 12/04/2019 10/23/2019  Decreased Interest 0 0 2 0 0  Down, Depressed, Hopeless 0 1 2 0 0  PHQ - 2 Score 0 1 4 0 0  Altered sleeping - 3 0 2 3  Tired, decreased energy - 3 3 2 3   Change in appetite - 0 0 2 0  Feeling bad or failure about yourself  - 0 0 0 0  Trouble concentrating - 0 0 0 0  Moving slowly or fidgety/restless - 0 0 0 0  Suicidal thoughts - 0 0 0 0  PHQ-9 Score - 7 7 6 6   Difficult doing work/chores - Somewhat difficult Somewhat difficult Somewhat difficult Not difficult at all  Some recent  data might be hidden    phq 9 is negative   Fall Risk: Fall Risk  12/14/2020 09/20/2020 08/11/2020 01/08/2020 12/04/2019  Falls in the past year? 0 0 0 0 0  Number falls in past yr: 0 0 0 0 0  Injury with Fall? 0 0 0 0 0  Comment - - - - -  Risk Factor Category  - - - - -  Risk for fall due to : - - No Fall Risks - -  Risk for fall due to: Comment - - - - -  Follow up - -  Falls prevention discussed - -     Functional Status Survey: Is the patient deaf or have difficulty hearing?: No Does the patient have difficulty seeing, even when wearing glasses/contacts?: No Does the patient have difficulty concentrating, remembering, or making decisions?: No Does the patient have difficulty walking or climbing stairs?: No Does the patient have difficulty dressing or bathing?: No Does the patient have difficulty doing errands alone such as visiting a doctor's office or shopping?: No   Assessment & Plan  1. Fibromyalgia  - pregabalin (LYRICA) 50 MG capsule; Take 1-2 capsules (50-100 mg total) by mouth at bedtime.  Dispense: 60 capsule; Refill: 2  2. RLS (restless legs syndrome)  Explained that leg cramps will likely improve once she resumes Lyrica, advised to start at one at night and go up to two at night, she ask if she should take potassium and explained - not necessary since last potassium was within normal limits

## 2020-12-21 ENCOUNTER — Ambulatory Visit: Payer: Self-pay

## 2020-12-21 NOTE — Telephone Encounter (Signed)
Patient called stating that she was seen in office last week for pain /cramping to her legs and feet  She also had left flank pain.  She was prescribed medication that she states has made the leg and feet better.  Her left flank pain has gotten worse and it is hard to walk with the pain.  She rates it at 10 and constant.  She has no urinary symptoms no fever. Per severity of pain patient will go to ER for evaluation of this pain. Care advice was read to patient.  She verbalized understanding but states she will drive herself against advice.   Reason for Disposition . Patient sounds very sick or weak to the triager  Answer Assessment - Initial Assessment Questions 1. LOCATION: "Where does it hurt?" (e.g., left, right)     left 2. ONSET: "When did the pain start?"    1 month 3. SEVERITY: "How bad is the pain?" (e.g., Scale 1-10; mild, moderate, or severe)   - MILD (1-3): doesn't interfere with normal activities    - MODERATE (4-7): interferes with normal activities or awakens from sleep    - SEVERE (8-10): excruciating pain and patient unable to do normal activities (stays in bed)       10 4. PATTERN: "Does the pain come and go, or is it constant?"      constant 5. CAUSE: "What do you think is causing the pain?"    Unsure 6. OTHER SYMPTOMS:  "Do you have any other symptoms?" (e.g., fever, abdominal pain, vomiting, leg weakness, burning with urination, blood in urine)     none 7. PREGNANCY:  "Is there any chance you are pregnant?" "When was your last menstrual period?"    N/A  Protocols used: FLANK PAIN-A-AH

## 2020-12-22 ENCOUNTER — Emergency Department
Admission: EM | Admit: 2020-12-22 | Discharge: 2020-12-22 | Disposition: A | Discharge status: Home / Self Care | Payer: Medicare Other | Attending: Emergency Medicine | Admitting: Emergency Medicine

## 2020-12-22 ENCOUNTER — Encounter: Payer: Self-pay | Admitting: Emergency Medicine

## 2020-12-22 ENCOUNTER — Other Ambulatory Visit: Payer: Self-pay

## 2020-12-22 ENCOUNTER — Emergency Department: Payer: Medicare Other

## 2020-12-22 DIAGNOSIS — K5730 Diverticulosis of large intestine without perforation or abscess without bleeding: Secondary | ICD-10-CM | POA: Diagnosis not present

## 2020-12-22 DIAGNOSIS — R109 Unspecified abdominal pain: Secondary | ICD-10-CM | POA: Insufficient documentation

## 2020-12-22 DIAGNOSIS — D1809 Hemangioma of other sites: Secondary | ICD-10-CM | POA: Diagnosis not present

## 2020-12-22 DIAGNOSIS — K219 Gastro-esophageal reflux disease without esophagitis: Secondary | ICD-10-CM | POA: Insufficient documentation

## 2020-12-22 DIAGNOSIS — Z79899 Other long term (current) drug therapy: Secondary | ICD-10-CM | POA: Diagnosis not present

## 2020-12-22 DIAGNOSIS — I700 Atherosclerosis of aorta: Secondary | ICD-10-CM | POA: Diagnosis not present

## 2020-12-22 DIAGNOSIS — M545 Low back pain, unspecified: Secondary | ICD-10-CM | POA: Diagnosis not present

## 2020-12-22 DIAGNOSIS — K449 Diaphragmatic hernia without obstruction or gangrene: Secondary | ICD-10-CM | POA: Diagnosis not present

## 2020-12-22 LAB — CBC
HCT: 40.3 % (ref 36.0–46.0)
Hemoglobin: 12.9 g/dL (ref 12.0–15.0)
MCH: 27.3 pg (ref 26.0–34.0)
MCHC: 32 g/dL (ref 30.0–36.0)
MCV: 85.4 fL (ref 80.0–100.0)
Platelets: 193 10*3/uL (ref 150–400)
RBC: 4.72 MIL/uL (ref 3.87–5.11)
RDW: 13.4 % (ref 11.5–15.5)
WBC: 4.7 10*3/uL (ref 4.0–10.5)
nRBC: 0 % (ref 0.0–0.2)

## 2020-12-22 LAB — URINALYSIS, COMPLETE (UACMP) WITH MICROSCOPIC
Bacteria, UA: NONE SEEN
Bilirubin Urine: NEGATIVE
Glucose, UA: NEGATIVE mg/dL
Ketones, ur: NEGATIVE mg/dL
Nitrite: NEGATIVE
Protein, ur: NEGATIVE mg/dL
Specific Gravity, Urine: 1.019 (ref 1.005–1.030)
pH: 5 (ref 5.0–8.0)

## 2020-12-22 LAB — BASIC METABOLIC PANEL
Anion gap: 8 (ref 5–15)
BUN: 11 mg/dL (ref 8–23)
CO2: 29 mmol/L (ref 22–32)
Calcium: 9.6 mg/dL (ref 8.9–10.3)
Chloride: 103 mmol/L (ref 98–111)
Creatinine, Ser: 0.73 mg/dL (ref 0.44–1.00)
GFR, Estimated: >60 mL/min (ref >60)
Glucose, Bld: 116 mg/dL — ABNORMAL HIGH (ref 70–99)
Potassium: 3.8 mmol/L (ref 3.5–5.1)
Sodium: 140 mmol/L (ref 135–145)

## 2020-12-22 MED ORDER — CYCLOBENZAPRINE HCL 10 MG PO TABS: 10.0000 mg | ORAL_TABLET | Freq: Three times a day (TID) | ORAL | 0 refills | Status: DC | PRN

## 2020-12-22 MED ORDER — TRAMADOL HCL 50 MG PO TABS: 50.0000 mg | ORAL_TABLET | Freq: Four times a day (QID) | ORAL | 0 refills | Status: AC | PRN

## 2020-12-22 NOTE — Discharge Instructions (Addendum)
Take the tramadol as needed for pain, and the Flexeril as needed as a muscle relaxant. Be aware that both of these medications can cause lightheadedness and sedation so be careful about taking them together. Do not drive or operate heavy machinery when on these medications.  Follow-up with Dr. Carlynn Purl in the next few weeks.  Return to the ER for new, worsening, or persistent severe back, flank, or abdominal pain, difficulty urinating, vomiting, fever, weakness, or any other new or worsening symptoms that concern you.

## 2020-12-22 NOTE — ED Triage Notes (Signed)
Pt comes into the ED via POV c/o left flank pain that has been intermittent x 1 month.  Pt states no changes in urination and denies any history of kidney stones.  Pt denies any nausea or vomiting and she states she is having normal BM.  Pt is in NAD at this time with even and unlabored respirations.

## 2020-12-22 NOTE — ED Provider Notes (Signed)
[facility] Emergency Department Provider Note ____________________________________________   Event Date/Time   First MD Initiated Contact with Patient 12/22/20 1200     (approximate)  I have reviewed the triage vital signs and the nursing notes.   HISTORY  Chief Complaint Flank Pain    HPI Ashlee Richardson is a 64 y.o. female with PMH as noted below who presents with left flank and lower back pain over the last month, intermittent course, worse in the last 1 to 2 weeks.  The patient initially had some cramping in her legs and went to see her PMD.  She was started on Lyrica.  However, she states that the back and flank pain have persisted.  She denies any associated nausea or vomiting, diarrhea, or urinary symptoms.  She has no previous history of this pain.  Past Medical History:  Diagnosis Date  . B12 deficiency 12/27/2017  . Diverticulitis   . Fibromyalgia   . GERD (gastroesophageal reflux disease)   . High cholesterol   . Migraine   . MS (multiple sclerosis) ([place])   . PUD (peptic ulcer disease) 04/28/2018    Patient Active Problem List   Diagnosis Date Noted  . Myalgia due to statin 05/26/2020  . History of lumbar fusion 11/22/2019  . Pre-diabetes 10/26/2019  . GERD (gastroesophageal reflux disease) 04/28/2018  . Pure hypercholesterolemia 04/28/2018  . Diverticulosis 04/28/2018  . RLS (restless legs syndrome) 04/28/2018  . Angina pectoris ([place]) 04/28/2018  . Vitamin D deficiency 12/27/2017  . Chronic fatigue, unspecified 12/27/2017  . Intractable migraine without aura and without status migrainosus 10/17/2015  . DDD (degenerative disc disease), cervical 05/23/2015  . Bilateral occipital neuralgia 05/23/2015  . DDD (degenerative disc disease), lumbar 05/23/2015  . Multiple sclerosis ([place]) 05/23/2015  . Cervical disc disorder with radiculopathy of cervical region 04/20/2015  . Right arm numbness 04/20/2015  . Right arm weakness 04/20/2015     Past Surgical History:  Procedure Laterality Date  . BACK SURGERY  1991?   Pt usure of date  . BREAST BIOPSY Right   . COLONOSCOPY  09/2007  . COLONOSCOPY WITH PROPOFOL N/A 08/28/2017   Procedure: COLONOSCOPY WITH PROPOFOL;  Surgeon: Christene Lye, MD;  Location: [facility] ENDOSCOPY;  Service: Endoscopy;  Laterality: N/A;  . ESOPHAGOGASTRODUODENOSCOPY (EGD) WITH PROPOFOL N/A 11/24/2018   Procedure: ESOPHAGOGASTRODUODENOSCOPY (EGD) WITH PROPOFOL;  Surgeon: Lin Landsman, MD;  Location[facility] ENDOSCOPY;  Service: Gastroenterology;  Laterality: N/A;  . Foot sugery Right    3rd toe from the right"    Prior to Admission medications   Medication Sig Start Date End Date Taking? Authorizing Provider  cyclobenzaprine (FLEXERIL) 10 MG tablet Take 1 tablet (10 mg total) by mouth 3 (three) times daily as needed for muscle spasms. 12/22/20  Yes Arta Silence, MD  traMADol (ULTRAM) 50 MG tablet Take 1 tablet (50 mg total) by mouth every 6 (six) hours as needed for up to 5 days. 12/22/20 12/27/20 Yes Arta Silence, MD  Calcium-Magnesium-Vitamin D 300-150-400 MG-MG-UNIT TABS Take by mouth.    [provider]  cholecalciferol (VITAMIN D3) 25 MCG (1000 UT) tablet Take 1,000 Units by mouth daily.    [provider]  DEXILANT 60 MG capsule TAKE 1 CAPSULE BY MOUTH EVERY DAY 04/06/19   Vanga, Tally Due, MD  Diclofenac-miSOPROStol 75-0.2 MG TBEC Take by mouth.    [provider]  donepezil (ARICEPT) 5 MG tablet Take 1 tablet by mouth daily. 08/04/19   Vladimir Crofts, MD  DULoxetine (CYMBALTA) 60 MG capsule Take 60 mg by mouth daily.    [provider]  erythromycin ophthalmic ointment  01/09/19   [provider]  ezetimibe (ZETIA) 10 MG tablet TAKE 1 TABLET BY MOUTH EVERY DAY 09/19/20   Ancil Boozer, Drue Stager, MD  fluticasone (FLONASE) 50 MCG/ACT nasal spray SPRAY 1 SPRAY INTO EACH NOSTRIL EVERY DAY Patient not taking: Reported on 12/14/2020 09/20/18    [provider]  magnesium oxide (MAG-OX) 400 MG tablet Take 400 mg by mouth daily.    [provider]  meclizine (ANTIVERT) 25 MG tablet Take 25 mg by mouth 3 (three) times daily as needed for dizziness.    [provider]  meloxicam (MOBIC) 15 MG tablet TAKE 1 TABLET BY MOUTH EVERY DAY 03/21/20   Steele Sizer, MD  mirabegron ER (MYRBETRIQ) 50 MG TB24 tablet Take 1 tablet (50 mg total) by mouth daily. 01/04/20   MacDiarmid, Nicki Reaper, MD  MURO 128 5 % ophthalmic solution INSTILL 1 DROP IN RIGHT EYE THREE TIMES PER DAY 04/19/19   [provider]  pregabalin (LYRICA) 50 MG capsule Take 1-2 capsules (50-100 mg total) by mouth at bedtime. 12/14/20   Steele Sizer, MD  rosuvastatin (CRESTOR) 20 MG tablet TAKE 1 TABLET BY MOUTH EVERY DAY 09/18/20   Steele Sizer, MD  SUMAtriptan (IMITREX) 100 MG tablet Take as directed PRN migraines 08/04/19   [provider]  terbinafine (LAMISIL) 250 MG tablet Take 1 tablet (250 mg total) by mouth daily. 11/28/20   Hyatt, Max T, DPM  tiZANidine (ZANAFLEX) 2 MG tablet TAKE 1 TABLET (2 MG TOTAL) BY MOUTH 3 (THREE) TIMES DAILY. 08/13/19   [provider]  traZODone (DESYREL) 50 MG tablet TAKE 1/2 TO 1 TABLET BY MOUTH AT BEDTIME AS NEEDED FOR SLEEP 06/13/20   Steele Sizer, MD  vitamin B-12 (CYANOCOBALAMIN) 1000 MCG tablet Take 1,000 mcg by mouth daily.    [provider]    Allergies Fenofibric acid, Lipitor [atorvastatin], and Nsaids  Family History  Problem Relation Age of Onset  . Heart disease Mother   . Diabetes Mother   . Kidney disease Mother   . Hypertension Mother   . Heart disease Father   . Hyperlipidemia Father   . Hypertension Father   . Heart disease Other   . Cancer Other        Breast  . Diabetes Other   . Hyperlipidemia Sister   . Hypertension Sister   . Kidney disease Sister   . Diabetes Sister   . Lung cancer Paternal Aunt   . Cancer Maternal Grandmother        breast     Social History Social History   Tobacco Use  . Smoking status: Never Smoker  . Smokeless tobacco: Never Used  . Tobacco comment: smoking cessation materials not required  Vaping Use  . Vaping Use: Never used  Substance Use Topics  . Alcohol use: No    Alcohol/week: 0.0 standard drinks  . Drug use: No    Review of Systems  Constitutional: No fever/chills. Eyes: No visual changes. ENT: No sore throat. Cardiovascular: Denies chest pain. Respiratory: Denies shortness of breath. Gastrointestinal: No vomiting. Genitourinary: Negative for dysuria or hematuria. Musculoskeletal: Positive for back pain. Skin: Negative for rash. Neurological: Negative for focal weakness or numbness.   ____________________________________________   PHYSICAL EXAM:  VITAL SIGNS: ED Triage Vitals  Enc Vitals Group     BP 12/22/20 0731 (!) 153/82     Pulse Rate  12/22/20 0731 (!) (P) 59     Resp 12/22/20 0731 20     Temp 12/22/20 0731 (P) 98.8 F (37.1 C)     Temp Source 12/22/20 0731 (P) Oral     SpO2 12/22/20 0731 99 %     Weight 12/22/20 0753 185 lb (83.9 kg)     Height 12/22/20 0753 5\' 8"  (1.727 m)     Head Circumference --      Peak Flow --      Pain Score 12/22/20 0752 10     Pain Loc --      Pain Edu? --      Excl. in GC? --     Constitutional: Alert and oriented. Well appearing and in no acute distress. Eyes: Conjunctivae are normal.  Head: Atraumatic. Nose: No congestion/rhinnorhea. Mouth/Throat: Mucous membranes are moist.   Neck: Normal range of motion.  Cardiovascular: Good peripheral circulation. Respiratory: Normal respiratory effort.  No retractions. Gastrointestinal: Soft with mild left flank tenderness.  No distention.  Genitourinary: Left CVA tenderness. Musculoskeletal: No lower extremity edema.  Extremities warm and well perfused.  No midline spinal tenderness.  Left lumbar paraspinal muscle tenderness. Neurologic:  Normal speech and language.  5/5 motor  strength and intact sensation to bilateral lower extremities.   Skin:  Skin is warm and dry. No rash noted. Psychiatric: Mood and affect are normal. Speech and behavior are normal.  ____________________________________________   LABS (all labs ordered are listed, but only abnormal results are displayed)  Labs Reviewed  URINALYSIS, COMPLETE (UACMP) WITH MICROSCOPIC - Abnormal; Notable for the following components:      Result Value   Color, Urine YELLOW (*)    APPearance CLEAR (*)    Hgb urine dipstick SMALL (*)    Leukocytes,Ua TRACE (*)    All other components within normal limits  BASIC METABOLIC PANEL - Abnormal; Notable for the following components:   Glucose, Bld 116 (*)    All other components within normal limits  CBC   ____________________________________________  EKG   ____________________________________________  RADIOLOGY  CT abdomen/pelvis: No acute abnormality  ____________________________________________   PROCEDURES  Procedure(s) performed: No  Procedures  Critical Care performed: No ____________________________________________   INITIAL IMPRESSION / ASSESSMENT AND PLAN / ED COURSE  Pertinent labs & imaging results that were available during my care of the patient were reviewed by me and considered in my medical decision making (see chart for details).  64 year old female with PMH as noted above presents with left flank and lower back pain over the last month.  She denies any associated GI or urinary symptoms.  I reviewed the past medical records in epic.  The patient has no recent prior ED visits or admissions.  She was seen by her PMD Dr. 77 on 12/22 primarily for leg cramps and pain and was resumed on Lyrica which she had been taking previously.  On exam, the patient is overall well-appearing.  Her vital signs are normal except for mild hypertension.  The abdomen is soft with some mild tenderness towards the left flank.  She has moderate  tenderness to the left lumbar paraspinal area but no midline tenderness.  Neurologic exam is nonfocal.  Differential includes ureteral stone, pyelonephritis, musculoskeletal low back pain, sciatica.  There is no evidence of cauda equina syndrome.  I also do not suspect diverticulitis or colitis given the location of the pain and tenderness to palpation of the back.  Initial lab work-up and urinalysis are unremarkable.  We will  obtain a CT for further evaluation.  ----------------------------------------- 3:22 PM on 12/22/2020 -----------------------------------------  CT shows no acute abnormalities. The patient continues to appear comfortable. Overall based on the negative work-up and the location of the pain is consistent with a musculoskeletal etiology. The patient feels comfortable going home. She is stable for discharge at this time. I will prescribe tramadol and Flexeril for symptomatic treatment. Return precautions given, and she expressed understanding. ____________________________________________   FINAL CLINICAL IMPRESSION(S) / ED DIAGNOSES  Final diagnoses:  Pain in left lumbar region of back      NEW MEDICATIONS STARTED DURING THIS VISIT:  Discharge Medication List as of 12/22/2020  2:29 PM    START taking these medications   Details  cyclobenzaprine (FLEXERIL) 10 MG tablet Take 1 tablet (10 mg total) by mouth 3 (three) times daily as needed for muscle spasms., Starting Thu 12/22/2020, Normal    traMADol (ULTRAM) 50 MG tablet Take 1 tablet (50 mg total) by mouth every 6 (six) hours as needed for up to 5 days., Starting Thu 12/22/2020, Until Tue 12/27/2020 at 2359, Normal         Note:  This document was prepared using Dragon voice recognition software and may include unintentional dictation errors.    Arta Silence, MD 12/22/20 1531

## 2021-01-04 ENCOUNTER — Other Ambulatory Visit: Payer: Self-pay

## 2021-01-04 ENCOUNTER — Encounter: Payer: Self-pay | Admitting: Podiatry

## 2021-01-04 ENCOUNTER — Ambulatory Visit (INDEPENDENT_AMBULATORY_CARE_PROVIDER_SITE_OTHER): Payer: Medicare Other | Admitting: Podiatry

## 2021-01-04 DIAGNOSIS — Z79899 Other long term (current) drug therapy: Secondary | ICD-10-CM

## 2021-01-04 DIAGNOSIS — L603 Nail dystrophy: Secondary | ICD-10-CM | POA: Diagnosis not present

## 2021-01-04 MED ORDER — TERBINAFINE HCL 250 MG PO TABS: 250.0000 mg | ORAL_TABLET | Freq: Every day | ORAL | 0 refills | Status: DC

## 2021-01-04 NOTE — Progress Notes (Signed)
She presents today for follow-up of her Lamisil therapy.  She denies fever chills nausea vomiting calf pain but chest pain shortness of breath.  States that she has had some back pain and some spasms but she does have a history of back trauma.  She states has been left-sided but she states thinks that is not related to her medicine.  Objective: Vital signs are stable she is alert and oriented x3 pulses are palpable.  No change in physical exam.  Assessment: Onychomycosis long-term therapy with Lamisil.  Plan: Requested a liver profile today to follow the basic metabolic panel that was performed recently at the ED which was normal.  Should her blood work come back abnormal we will notify her immediately.  We did write another prescription for 90 days of Lamisil.  Follow-up with her in 4 months

## 2021-01-05 LAB — HEPATIC FUNCTION PANEL
ALT: 15 IU/L (ref 0–32)
AST: 15 IU/L (ref 0–40)
Albumin: 5.1 g/dL — ABNORMAL HIGH (ref 3.8–4.8)
Alkaline Phosphatase: 80 IU/L (ref 44–121)
Bilirubin Total: 0.2 mg/dL (ref 0.0–1.2)
Bilirubin, Direct: <0.1 mg/dL (ref 0.00–0.40)
Total Protein: 7.6 g/dL (ref 6.0–8.5)

## 2021-01-25 ENCOUNTER — Other Ambulatory Visit: Payer: Self-pay | Admitting: Family Medicine

## 2021-01-25 DIAGNOSIS — Z1231 Encounter for screening mammogram for malignant neoplasm of breast: Secondary | ICD-10-CM

## 2021-02-27 ENCOUNTER — Telehealth: Payer: Self-pay

## 2021-02-27 NOTE — Telephone Encounter (Signed)
Copied from [place] [id number]. Topic: General - Other >> Feb 27, 2021  1:50 PM Leward Quan A wrote: Reason for CRM: Patient called in to inquire of Dr Ancil Boozer what she can do for her pain that she have in her back. Was seen at the ER in December but states that the pain is still there and getting worst. Please call  Ph#  [phone]

## 2021-02-28 NOTE — Telephone Encounter (Signed)
Pt already has an appt on 03/20/21 and she states she will wait til then

## 2021-03-01 ENCOUNTER — Encounter: Payer: Self-pay | Admitting: Emergency Medicine

## 2021-03-01 ENCOUNTER — Emergency Department: Payer: Medicare Other

## 2021-03-01 ENCOUNTER — Emergency Department
Admission: EM | Admit: 2021-03-01 | Discharge: 2021-03-01 | Disposition: A | Discharge status: Home / Self Care | Payer: Medicare Other | Attending: Emergency Medicine | Admitting: Emergency Medicine

## 2021-03-01 ENCOUNTER — Other Ambulatory Visit: Payer: Self-pay

## 2021-03-01 DIAGNOSIS — M47816 Spondylosis without myelopathy or radiculopathy, lumbar region: Secondary | ICD-10-CM | POA: Insufficient documentation

## 2021-03-01 DIAGNOSIS — M545 Low back pain, unspecified: Secondary | ICD-10-CM | POA: Insufficient documentation

## 2021-03-01 DIAGNOSIS — M5459 Other low back pain: Secondary | ICD-10-CM | POA: Diagnosis not present

## 2021-03-01 DIAGNOSIS — G8929 Other chronic pain: Secondary | ICD-10-CM | POA: Insufficient documentation

## 2021-03-01 LAB — URINALYSIS, COMPLETE (UACMP) WITH MICROSCOPIC
Bacteria, UA: NONE SEEN
Bilirubin Urine: NEGATIVE
Glucose, UA: NEGATIVE mg/dL
Hgb urine dipstick: NEGATIVE
Ketones, ur: NEGATIVE mg/dL
Leukocytes,Ua: NEGATIVE
Nitrite: NEGATIVE
Protein, ur: NEGATIVE mg/dL
Specific Gravity, Urine: 1.012 (ref 1.005–1.030)
pH: 7 (ref 5.0–8.0)

## 2021-03-01 MED ORDER — PREDNISONE 10 MG PO TABS: ORAL_TABLET | ORAL | 0 refills | Status: DC

## 2021-03-01 NOTE — ED Provider Notes (Signed)
[facility] Emergency Department Provider Note  ____________________________________________   Event Date/Time   First MD Initiated Contact with Patient 03/01/21 [phone]     (approximate)  I have reviewed the triage vital signs and the nursing notes.   HISTORY  Chief Complaint Back Pain   HPI Ashlee Richardson is a 65 y.o. female presents to the ED with complaint of continued left-sided back pain that she states started a month ago and she was seen in the emergency department.  She states that she was given a prescription for a muscle relaxant and tramadol which did not make the pain go away completely.  Patient states that this was in the emergency department at [facility] 1 month ago.  Looking at her records it was actually December 2021 and a CT scan was done to rule out a stone.  Patient was discharged with prednisone and tramadol for pain.  She also has history of degenerative disc disease and a back surgery in 1991.  She states that she has not seen her orthopedist in [place] for her back since it has been hurting.  She denies any urinary symptoms, incontinence of bowel or bladder, saddle anesthesias or radicular type pain.  Currently she rates her pain as a 10/10.      Past Medical History:  Diagnosis Date  . B12 deficiency 12/27/2017  . Diverticulitis   . Fibromyalgia   . GERD (gastroesophageal reflux disease)   . High cholesterol   . Migraine   . MS (multiple sclerosis) ([place])   . PUD (peptic ulcer disease) 04/28/2018    Patient Active Problem List   Diagnosis Date Noted  . Myalgia due to statin 05/26/2020  . History of lumbar fusion 11/22/2019  . Pre-diabetes 10/26/2019  . GERD (gastroesophageal reflux disease) 04/28/2018  . Pure hypercholesterolemia 04/28/2018  . Diverticulosis 04/28/2018  . RLS (restless legs syndrome) 04/28/2018  . Angina pectoris ([place]) 04/28/2018  . Vitamin D deficiency 12/27/2017  . Chronic fatigue, unspecified 12/27/2017  .  Intractable migraine without aura and without status migrainosus 10/17/2015  . DDD (degenerative disc disease), cervical 05/23/2015  . Bilateral occipital neuralgia 05/23/2015  . DDD (degenerative disc disease), lumbar 05/23/2015  . Multiple sclerosis ([place]) 05/23/2015  . Cervical disc disorder with radiculopathy of cervical region 04/20/2015  . Right arm numbness 04/20/2015  . Right arm weakness 04/20/2015    Past Surgical History:  Procedure Laterality Date  . BACK SURGERY  1991?   Pt usure of date  . BREAST BIOPSY Right   . COLONOSCOPY  09/2007  . COLONOSCOPY WITH PROPOFOL N/A 08/28/2017   Procedure: COLONOSCOPY WITH PROPOFOL;  Surgeon: Christene Lye, MD;  Location: [facility] ENDOSCOPY;  Service: Endoscopy;  Laterality: N/A;  . ESOPHAGOGASTRODUODENOSCOPY (EGD) WITH PROPOFOL N/A 11/24/2018   Procedure: ESOPHAGOGASTRODUODENOSCOPY (EGD) WITH PROPOFOL;  Surgeon: Lin Landsman, MD;  Location: [facility] ENDOSCOPY;  Service: Gastroenterology;  Laterality: N/A;  . Foot sugery Right    3rd toe from the right"    Prior to Admission medications   Medication Sig Start Date End Date Taking? Authorizing Provider  predniSONE (DELTASONE) 10 MG tablet Take 3 tablets once a day for 5 days 03/01/21  Yes Johnn Hai, PA-C  Calcium-Magnesium-Vitamin D 300-150-400 MG-MG-UNIT TABS Take by mouth.    [provider]  cholecalciferol (VITAMIN D3) 25 MCG (1000 UT) tablet Take 1,000 Units by mouth daily.    [provider]  DEXILANT 60 MG capsule TAKE 1 CAPSULE BY MOUTH EVERY DAY  04/06/19   Lin Landsman, MD  Diclofenac-miSOPROStol 75-0.2 MG TBEC Take by mouth.    [provider]  donepezil (ARICEPT) 5 MG tablet Take 1 tablet by mouth daily. 08/04/19   Vladimir Crofts, MD  DULoxetine (CYMBALTA) 60 MG capsule Take 60 mg by mouth daily.    [provider]  erythromycin ophthalmic ointment  01/09/19   [provider]  ezetimibe (ZETIA) 10 MG tablet TAKE 1  TABLET BY MOUTH EVERY DAY 09/19/20   Ancil Boozer, Drue Stager, MD  fluticasone (FLONASE) 50 MCG/ACT nasal spray SPRAY 1 SPRAY INTO EACH NOSTRIL EVERY DAY Patient not taking: Reported on 12/14/2020 09/20/18   [provider]  magnesium oxide (MAG-OX) 400 MG tablet Take 400 mg by mouth daily.    [provider]  meclizine (ANTIVERT) 25 MG tablet Take 25 mg by mouth 3 (three) times daily as needed for dizziness.    [provider]  meloxicam (MOBIC) 15 MG tablet TAKE 1 TABLET BY MOUTH EVERY DAY 03/21/20   Steele Sizer, MD  mirabegron ER (MYRBETRIQ) 50 MG TB24 tablet Take 1 tablet (50 mg total) by mouth daily. 01/04/20   MacDiarmid, Nicki Reaper, MD  MURO 128 5 % ophthalmic solution INSTILL 1 DROP IN RIGHT EYE THREE TIMES PER DAY 04/19/19   [provider]  pregabalin (LYRICA) 50 MG capsule Take 1-2 capsules (50-100 mg total) by mouth at bedtime. 12/14/20   Steele Sizer, MD  rosuvastatin (CRESTOR) 20 MG tablet TAKE 1 TABLET BY MOUTH EVERY DAY 09/18/20   Steele Sizer, MD  SUMAtriptan (IMITREX) 100 MG tablet Take as directed PRN migraines 08/04/19   [provider]  terbinafine (LAMISIL) 250 MG tablet Take 1 tablet (250 mg total) by mouth daily. 01/04/21   Hyatt, Max T, DPM  tiZANidine (ZANAFLEX) 2 MG tablet TAKE 1 TABLET (2 MG TOTAL) BY MOUTH 3 (THREE) TIMES DAILY. 08/13/19   [provider]  traZODone (DESYREL) 50 MG tablet TAKE 1/2 TO 1 TABLET BY MOUTH AT BEDTIME AS NEEDED FOR SLEEP 06/13/20   Steele Sizer, MD  vitamin B-12 (CYANOCOBALAMIN) 1000 MCG tablet Take 1,000 mcg by mouth daily.    [provider]    Allergies Fenofibric acid, Lipitor [atorvastatin], and Nsaids  Family History  Problem Relation Age of Onset  . Heart disease Mother   . Diabetes Mother   . Kidney disease Mother   . Hypertension Mother   . Heart disease Father   . Hyperlipidemia Father   . Hypertension Father   . Heart disease Other   . Cancer Other        Breast  .  Diabetes Other   . Hyperlipidemia Sister   . Hypertension Sister   . Kidney disease Sister   . Diabetes Sister   . Lung cancer Paternal Aunt   . Cancer Maternal Grandmother        breast    Social History Social History   Tobacco Use  . Smoking status: Never Smoker  . Smokeless tobacco: Never Used  . Tobacco comment: smoking cessation materials not required  Vaping Use  . Vaping Use: Never used  Substance Use Topics  . Alcohol use: No    Alcohol/week: 0.0 standard drinks  . Drug use: No    Review of Systems Constitutional: No fever/chills Eyes: No visual changes. ENT: No sore throat. Cardiovascular: Denies chest pain. Respiratory: Denies shortness of breath. Gastrointestinal: No abdominal pain.  No nausea, no vomiting.  No diarrhea.  No constipation. Genitourinary: Negative  for dysuria. Musculoskeletal: Positive for left-sided back pain.  Negative for radiculopathy or paresthesias. Skin: Negative for rash. Neurological: Negative for headaches, focal weakness or numbness. ____________________________________________   PHYSICAL EXAM:  VITAL SIGNS: ED Triage Vitals  Enc Vitals Group     BP 03/01/21 0909 (!) 160/67     Pulse Rate 03/01/21 0909 79     Resp 03/01/21 0909 18     Temp 03/01/21 0909 98.1 F (36.7 C)     Temp Source 03/01/21 0909 Oral     SpO2 03/01/21 0909 100 %     Weight 03/01/21 0906 185 lb (83.9 kg)     Height 03/01/21 0906 5\' 8"  (1.727 m)     Head Circumference --      Peak Flow --      Pain Score 03/01/21 0906 10     Pain Loc --      Pain Edu? --      Excl. in [place]? --     Constitutional: Alert and oriented. Well appearing and in no acute distress. Eyes: Conjunctivae are normal.  Head: Atraumatic. Neck: No stridor.   Cardiovascular: Normal rate, regular rhythm. Grossly normal heart sounds.  Good peripheral circulation. Respiratory: Normal respiratory effort.  No retractions. Lungs CTAB. Gastrointestinal: Soft and nontender. No distention.  No abdominal bruits. No CVA tenderness. Musculoskeletal: On examination of the back there is no point tenderness on palpation of the thoracic spine however there is tenderness diffusely on palpation of the lower lumbar and paravertebral muscles to the left side.  Range of motion is slow and guarded secondary to discomfort.  Patient lower extremities good muscle strength bilaterally.  Straight leg raises are approximately 70 degrees with irritation and increased pain to her back.  Patient is ambulatory without any assistance. Neurologic:  Normal speech and language. No gross focal neurologic deficits are appreciated.  Reflexes are 1+ bilaterally. Skin:  Skin is warm, dry and intact. No rash noted. Psychiatric: Mood and affect are normal. Speech and behavior are normal.  ____________________________________________   LABS (all labs ordered are listed, but only abnormal results are displayed)  Labs Reviewed  URINALYSIS, COMPLETE (UACMP) WITH MICROSCOPIC - Abnormal; Notable for the following components:      Result Value   Color, Urine YELLOW (*)    APPearance CLEAR (*)    All other components within normal limits    RADIOLOGY I, Johnn Hai, personally viewed and evaluated these images (plain radiographs) as part of my medical decision making, as well as reviewing the written report by the radiologist.   Official radiology report(s): DG Lumbar Spine 2-3 Views  Result Date: 03/01/2021 CLINICAL DATA:  Left-sided low back pain for the past 3 months. EXAM: LUMBAR SPINE - 2-3 VIEW COMPARISON:  CT abdomen pelvis dated December 22, 2020. FINDINGS: Five lumbar type vertebral bodies. Prior L4-S1 posterior fusion. No evidence of hardware failure or loosening. No acute fracture or subluxation. Vertebral body heights are preserved. Straightening of the normal lumbar lordosis. No significant listhesis. Unchanged moderate disc height loss at L1-L2 and L2-L3. Unchanged severe disc height loss at L3-L4.  The sacroiliac joints are unremarkable. IMPRESSION: 1. No acute osseous abnormality peer 2. Prior L4-S1 posterior fusion without evidence of hardware complication. 3. Unchanged upper lumbar spondylosis above the fusion. Electronically Signed   By: Titus Dubin M.D.   On: 03/01/2021 12:16    ____________________________________________   PROCEDURES  Procedure(s) performed (including Critical Care):  Procedures   ____________________________________________   INITIAL IMPRESSION /  ASSESSMENT AND PLAN / ED COURSE  As part of my medical decision making, I reviewed the following data within the electronic MEDICAL RECORD NUMBER Notes from prior ED visits and [place] Controlled Substance Database  65 year old female presents to the ED with complaint of left lower back pain for approximately 3 months.  She initially said that she was in the emergency department 1 month ago however records show that she was here 11/2020 for back problems.  She has not followed up with her orthopedic surgeon in [place] since that time.  X-rays today shows degenerative disc disease multiple levels however surgical hardware is stable.  Patient was given a 5-day course of prednisone.  She is encouraged to call her orthopedic surgeon and make an appointment for any continued back pain.  Patient voices that the last time she saw him he wanted to do surgery and she is not ready.  ____________________________________________   FINAL CLINICAL IMPRESSION(S) / ED DIAGNOSES  Final diagnoses:  Chronic left-sided low back pain without sciatica  Spondylosis of lumbar region without myelopathy or radiculopathy     ED Discharge Orders         Ordered    predniSONE (DELTASONE) 10 MG tablet        03/01/21 1230          *Please note:  Ashlee Richardson was evaluated in Emergency Department on 03/01/2021 for the symptoms described in the history of present illness. She was evaluated in the context of the global COVID-19  pandemic, which necessitated consideration that the patient might be at risk for infection with the SARS-CoV-2 virus that causes COVID-19. Institutional protocols and algorithms that pertain to the evaluation of patients at risk for COVID-19 are in a state of rapid change based on information released by regulatory bodies including the CDC and federal and state organizations. These policies and algorithms were followed during the patient's care in the ED.  Some ED evaluations and interventions may be delayed as a result of limited staffing during and the pandemic.*   Note:  This document was prepared using Dragon voice recognition software and may include unintentional dictation errors.    Johnn Hai, PA-C 03/01/21 1426    Harvest Dark, MD 03/01/21 [phone]

## 2021-03-01 NOTE — Discharge Instructions (Signed)
You need to call your doctor for your back in [place] and make an appointment.  In the meantime a prescription was sent to your pharmacy to take for the next 5 days to help with inflammation.  You may use lidocaine patches such as Salonpas that can be bought over-the-counter to help with your pain.  Use ice or heat as needed for discomfort.  You may also follow-up with your primary care provider if any continued medications are needed.

## 2021-03-01 NOTE — ED Notes (Addendum)
See triage note, pt reports left sided back pain that started a month ago and was seen for same. Reports came back to ER d/t continued pain. Denies injury, bowel or bladder problems.  Ambulatory to treatment room

## 2021-03-01 NOTE — ED Triage Notes (Signed)
Pt comes into the ED via POV c/o Lower left back pain.  Pt states she was seen last month for the same thing and they told her it was musculoskeletal pain.  Pt states this feels the same.  Pt ambulatory to triage and in NAD at this time.  Pt states that the pain from last month never got any better and that is why she is back.  Pt also admits to some nausea due to the pain.

## 2021-03-08 ENCOUNTER — Telehealth: Payer: Self-pay

## 2021-03-08 ENCOUNTER — Other Ambulatory Visit: Payer: Self-pay

## 2021-03-08 ENCOUNTER — Encounter: Payer: Self-pay | Admitting: Orthopaedic Surgery

## 2021-03-08 ENCOUNTER — Ambulatory Visit (INDEPENDENT_AMBULATORY_CARE_PROVIDER_SITE_OTHER): Payer: Medicare Other | Admitting: Orthopaedic Surgery

## 2021-03-08 VITALS — BP 145/78 | HR 64

## 2021-03-08 DIAGNOSIS — M5136 Other intervertebral disc degeneration, lumbar region: Secondary | ICD-10-CM

## 2021-03-08 DIAGNOSIS — Z981 Arthrodesis status: Secondary | ICD-10-CM | POA: Diagnosis not present

## 2021-03-08 DIAGNOSIS — M5010 Cervical disc disorder with radiculopathy, unspecified cervical region: Secondary | ICD-10-CM

## 2021-03-08 DIAGNOSIS — M48061 Spinal stenosis, lumbar region without neurogenic claudication: Secondary | ICD-10-CM

## 2021-03-08 NOTE — Telephone Encounter (Signed)
Please advise 

## 2021-03-08 NOTE — Telephone Encounter (Signed)
patient called she is requesting a medial letter regarding her condition due to her needing a medical pillow for her chair at work call [phone]

## 2021-03-08 NOTE — Telephone Encounter (Signed)
OK thank you 

## 2021-03-08 NOTE — Progress Notes (Signed)
Office Visit Note   Patient: Ashlee Richardson           Date of Birth: August 18, 1956           MRN: [id number] Visit Date: 03/08/2021              Requested by: Steele Sizer, [address] [place],  [place] [address] PCP: Steele Sizer, MD   Assessment & Plan: Visit Diagnoses:  1. DDD (degenerative disc disease), lumbar   2. Lumbar foraminal stenosis   3. History of lumbar fusion   4. Cervical disc disorder with radiculopathy of cervical region     Plan: Patient has significant spondylosis at L2-3, L3-4 above her previous two-level fusion L4-S1.  She has been in pain management in the past but has just had one prescription for tramadol in December 2021.  We will set patient up for some physical therapy at [facility] outpatient therapy.  Recheck 5 weeks if she is having persistent problems she will need a lumbar myelogram CT scan due to previous hardware blocking visualization at the L3-4 level where she likely has significant pathology.  We discussed possible referral to Dr. Louanne Skye if those levels need surgical treatment.  Follow-Up Instructions: No follow-ups on file.   Orders:  No orders of the defined types were placed in this encounter.  No orders of the defined types were placed in this encounter.     Procedures: No procedures performed   Clinical Data: No additional findings.   Subjective: Chief Complaint  Patient presents with  . Lower Back - Pain    HPI 65 year old female seen in the ED 03/01/2021 and is here for follow-up.  He has had back pain is taken prednisone for 5 days states it helped some.  She is used trazodone a muscle relaxant which did not really help she has pain primarily left side worse than left and right into her legs.  Patient's had previous L4-S1 fusion By Dr. Raquel Sarna in [facility] with U rod.  Two-level fusion looks solid but she has progressive degenerative changes above her fusion. Review of Systems  14 point system update is unchanged from 11/17/2019 office visit.   Objective: Vital Signs: BP (!) 145/78   Pulse 64   Physical Exam Constitutional:      Appearance: She is well-developed.  HENT:     Head: Normocephalic.     Right Ear: External ear normal.     Left Ear: External ear normal.  Eyes:     Pupils: Pupils are equal, round, and reactive to light.  Neck:     Thyroid: No thyromegaly.     Trachea: No tracheal deviation.  Cardiovascular:     Rate and Rhythm: Normal rate.  Pulmonary:     Effort: Pulmonary effort is normal.  Abdominal:     Palpations: Abdomen is soft.  Skin:    General: Skin is warm and dry.  Neurological:     Mental Status: She is alert and oriented to person, place, and time.  Psychiatric:        Behavior: Behavior normal.     Ortho Exam no hip flexion weakness negative logroll of the hips.  Well-healed lumbar incision.  Specialty Comments:  No specialty comments available.  Imaging: No results found.   PMFS History: Patient Active Problem List   Diagnosis Date Noted  . Lumbar foraminal stenosis 03/08/2021  . Myalgia due to statin 05/26/2020  . History of lumbar fusion 11/22/2019  .  Pre-diabetes 10/26/2019  . GERD (gastroesophageal reflux disease) 04/28/2018  . Pure hypercholesterolemia 04/28/2018  . Diverticulosis 04/28/2018  . RLS (restless legs syndrome) 04/28/2018  . Angina pectoris ([place]) 04/28/2018  . Vitamin D deficiency 12/27/2017  . Chronic fatigue, unspecified 12/27/2017  . Intractable migraine without aura and without status migrainosus 10/17/2015  . DDD (degenerative disc disease), cervical 05/23/2015  . Bilateral occipital neuralgia 05/23/2015  . DDD (degenerative disc disease), lumbar 05/23/2015  . Multiple sclerosis ([place]) 05/23/2015  . Cervical disc disorder with radiculopathy of cervical region 04/20/2015  . Right arm numbness 04/20/2015  . Right arm weakness 04/20/2015   Past Medical History:  Diagnosis Date  .  B12 deficiency 12/27/2017  . Diverticulitis   . Fibromyalgia   . GERD (gastroesophageal reflux disease)   . High cholesterol   . Migraine   . MS (multiple sclerosis) ([place])   . PUD (peptic ulcer disease) 04/28/2018    Family History  Problem Relation Age of Onset  . Heart disease Mother   . Diabetes Mother   . Kidney disease Mother   . Hypertension Mother   . Heart disease Father   . Hyperlipidemia Father   . Hypertension Father   . Heart disease Other   . Cancer Other        Breast  . Diabetes Other   . Hyperlipidemia Sister   . Hypertension Sister   . Kidney disease Sister   . Diabetes Sister   . Lung cancer Paternal Aunt   . Cancer Maternal Grandmother        breast    Past Surgical History:  Procedure Laterality Date  . BACK SURGERY  1991?   Pt usure of date  . BREAST BIOPSY Right   . COLONOSCOPY  09/2007  . COLONOSCOPY WITH PROPOFOL N/A 08/28/2017   Procedure: COLONOSCOPY WITH PROPOFOL;  Surgeon: Christene Lye, MD;  Location: [facility] ENDOSCOPY;  Service: Endoscopy;  Laterality: N/A;  . ESOPHAGOGASTRODUODENOSCOPY (EGD) WITH PROPOFOL N/A 11/24/2018   Procedure: ESOPHAGOGASTRODUODENOSCOPY (EGD) WITH PROPOFOL;  Surgeon: Lin Landsman, MD;  Location: [facility] ENDOSCOPY;  Service: Gastroenterology;  Laterality: N/A;  . Foot sugery Right    3rd toe from the right"   Social History   Occupational History  . 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 Smoking status: Never Smoker  . Smokeless tobacco: Never Used  . Tobacco comment: smoking cessation materials not required  Vaping Use  . Vaping Use: Never used  Substance and Sexual Activity  . Alcohol use: No    Alcohol/week: 0.0 standard drinks  . Drug use: No  . Sexual activity: Yes    Birth control/protection: Post-menopausal

## 2021-03-09 ENCOUNTER — Other Ambulatory Visit: Payer: Self-pay | Admitting: Family Medicine

## 2021-03-09 DIAGNOSIS — I209 Angina pectoris, unspecified: Secondary | ICD-10-CM

## 2021-03-09 DIAGNOSIS — E785 Hyperlipidemia, unspecified: Secondary | ICD-10-CM

## 2021-03-14 ENCOUNTER — Other Ambulatory Visit: Payer: Self-pay

## 2021-03-14 ENCOUNTER — Ambulatory Visit: Payer: Medicare Other | Attending: Orthopaedic Surgery

## 2021-03-14 DIAGNOSIS — M542 Cervicalgia: Secondary | ICD-10-CM | POA: Insufficient documentation

## 2021-03-14 DIAGNOSIS — M5412 Radiculopathy, cervical region: Secondary | ICD-10-CM | POA: Diagnosis not present

## 2021-03-14 DIAGNOSIS — R293 Abnormal posture: Secondary | ICD-10-CM | POA: Insufficient documentation

## 2021-03-14 DIAGNOSIS — G8929 Other chronic pain: Secondary | ICD-10-CM

## 2021-03-14 DIAGNOSIS — M62830 Muscle spasm of back: Secondary | ICD-10-CM | POA: Diagnosis not present

## 2021-03-14 DIAGNOSIS — M6281 Muscle weakness (generalized): Secondary | ICD-10-CM | POA: Insufficient documentation

## 2021-03-14 DIAGNOSIS — M5441 Lumbago with sciatica, right side: Secondary | ICD-10-CM | POA: Diagnosis not present

## 2021-03-14 DIAGNOSIS — M5442 Lumbago with sciatica, left side: Secondary | ICD-10-CM | POA: Insufficient documentation

## 2021-03-14 NOTE — Therapy (Signed)
[facility] [address] [place], [address] Phone: [phone]   Fax:  [phone]  Physical Therapy Evaluation  Patient Details  Name: Ashlee Richardson MRN: [id number] Date of Birth: 03-07-1956 Referring Provider (PT): Lorin Mercy MD   Encounter Date: 03/14/2021   PT End of Session - 03/14/21 1622    Visit Number 1    Number of Visits 17    Date for PT Re-Evaluation 05/02/21    PT Start Time 6789    PT Stop Time 1600    PT Time Calculation (min) 45 min    Activity Tolerance Patient limited by pain    Behavior During Therapy [facility] for tasks assessed/performed           Past Medical History:  Diagnosis Date  . B12 deficiency 12/27/2017  . Diverticulitis   . Fibromyalgia   . GERD (gastroesophageal reflux disease)   . High cholesterol   . Migraine   . MS (multiple sclerosis) ([place])   . PUD (peptic ulcer disease) 04/28/2018    Past Surgical History:  Procedure Laterality Date  . BACK SURGERY  1991?   Pt usure of date  . BREAST BIOPSY Right   . COLONOSCOPY  09/2007  . COLONOSCOPY WITH PROPOFOL N/A 08/28/2017   Procedure: COLONOSCOPY WITH PROPOFOL;  Surgeon: Christene Lye, MD;  Location: [facility] ENDOSCOPY;  Service: Endoscopy;  Laterality: N/A;  . ESOPHAGOGASTRODUODENOSCOPY (EGD) WITH PROPOFOL N/A 11/24/2018   Procedure: ESOPHAGOGASTRODUODENOSCOPY (EGD) WITH PROPOFOL;  Surgeon: Lin Landsman, MD;  Location: [facility] ENDOSCOPY;  Service: Gastroenterology;  Laterality: N/A;  . Foot sugery Right    3rd toe from the right"    There were no vitals filed for this visit.    Subjective Assessment - 03/14/21 1604    Subjective Patient reports that she as constant chronic low back pain accompanied with some bilateral sciatica. Patient reports reports L sided neck pain that radiates to L arm. Patient reports an onset of this condition in the early 90's that has resurfaced since December 2021. Patient reports  little to no sleeping due to low back pain. 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 Patient would like to work out for fitness to decrease pain. Patient states that she was in physical therapy so that she can have low back surgery. Patient reports a fear of disc deteriorating overtime.    Pertinent History Back surgery    Limitations Sitting;Lifting    How long can you sit comfortably? 5 minutes    How long can you walk comfortably? 2 miles    Patient Stated Goals To return to physical activity and gym exercises    Currently in Pain? Yes    Pain Score 10-Worst pain ever    Pain Location Back    Pain Orientation Lower    Pain Descriptors / Indicators Aching;Sharp    Pain Type Chronic pain    Pain Radiating Towards foot    Pain Onset Other (comment)   3 months ago   Pain Frequency Constant    Aggravating Factors  Sitting, lifting, lying on the left side    Pain Relieving Factors Standing and walking    Multiple Pain Sites Yes    Pain Score 10    Pain Location Neck    Pain Orientation Upper;Left    Pain Descriptors / Indicators Aching    Pain Type Chronic pain    Pain Radiating Towards arm    Pain  Onset More than a month ago    Pain Frequency Constant    Pain Score 10    Pain Location Ankle    Pain Orientation Right    Pain Descriptors / Indicators Aching    Pain Type Chronic pain    Pain Onset More than a month ago    Pain Frequency Intermittent    Effect of Pain on Daily Activities swelling              [facility] PT Assessment - 03/14/21 0001      Assessment   Medical Diagnosis Low back pain DDD    Referring Provider (PT) Yates MD    Onset Date/Surgical Date 11/23/20    Hand Dominance Right    Next MD Visit unknown    Prior Therapy yes      Balance Screen   Has the patient fallen in the past 6 months No    Has the patient had a decrease in activity level because of a fear of falling?  Yes    Is the patient reluctant to leave their home  because of a fear of falling?  No      Home Environment   Living Environment Private residence    Living Arrangements Alone    Available Help at Discharge Family      Prior Function   Level of Independence Independent    Vocation On disability   works part time   [employer] sitting    Leisure walking and exercise      Cognition   Overall Cognitive Status Within Functional Limits for tasks assessed              Objective measurements completed on examination: See above findings.    Evaluation o  Lumbar AROM  - FLEX . 25% Limited and painful  - EXT . 25% Limited and painful  - L LAT FLEX . 25% Limited and painful  - R LAT FLEX . 25% Limited and painful  - R ROTN  . WNL - L ROTN  . 25% Painful and limited o Repeated movements  o PAM - Grade 1 CPAs  . L3-L5 painful and hypomobile - Grade 1 UPAs . L L3-L5 painful and hypomobile o Hip MMT done in sitting  - FLEX . L 3/5 . R 4/5 - EXT  . B 4/5 - IR  . B 4/5 - ER  . B 4/5 - ADD . B 4/5 - ABD      - B 4/5   TREATMENT Therapeutic Exercise  Lumbar extension in standing x 5 Sit to stand x 10  Pelvic tilt in sitting x 5  Performed to address LE strength        PT Education - 03/14/21 1621    Education Details form/ technique with exercises which include sit to stand, pelvic tilt, and lumbar extension in stamding, plan of care, expectations of therapy,    Person(s) Educated Patient    Methods Explanation;Demonstration    Comprehension Verbalized understanding;Returned demonstration            PT Short Term Goals - 03/14/21 1643      PT SHORT TERM GOAL #1   Title Patient will improve FOTO score from 57 to 60 to indicate improvement with ambulation.    Time 2    Period Weeks    Status New    Target Date 03/28/21             PT Long Term  Goals - 03/14/21 1650      PT LONG TERM GOAL #2   Title Patient will improve her lumbar range of motion to 50% of normal limits to be able  to return to exercise at the gym    Time 8    Period Weeks    Status New    Target Date 05/09/21      PT LONG TERM GOAL #3   Title Patient will be able to comfortably sit for 45 minutes to be able to perform occupational tasks.    Time 8    Period Weeks    Status New    Target Date 05/09/21                  Plan - 03/14/21 1625    Clinical Impression Statement Patient is a R hand dominant 65 y.o. female with low back pain limited by sitting and lifting. Patient's lumbar dysfunction is indicated a decrease ROM specifcally flexion and returning to standing from flexion possible indications of hypomobility, allodynia, and multifidi weakness. Patient was limited by pain during the session and had an increase in difficutly with switching postions. Patient will benefit from skilled physical therapy to increase mobility and decrease muscle guarding to return to prior level of function.    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/Moderate complexity    Clinical Decision Making Moderate    Rehab Potential Fair    PT Frequency 2x / week    PT Duration 8 weeks    PT Treatment/Interventions Therapeutic exercise;Therapeutic activities;Patient/family education;Passive range of motion;Manual techniques;Dry needling    PT Next Visit Plan Work on stregntening paraspinal muscles and glutes and increasing lumbar extension    PT Home Exercise Plan standing lumbar extension, sit to stand, pelvic tilt    Consulted and Agree with Plan of Care Patient           Patient will benefit from skilled therapeutic intervention in order to improve the following deficits and impairments:   Pain,Hypomobility,Decreased strength,Decreased range of motion,Decreased endurance,Decreased activity tolerance,Increased edema  Visit Diagnosis: Chronic bilateral low back pain with bilateral sciatica  Muscle spasm of back     Problem List Patient Active Problem List   Diagnosis Date Noted  . Lumbar foraminal stenosis 03/08/2021  . Myalgia due to statin 05/26/2020  . History of lumbar fusion 11/22/2019  . Pre-diabetes 10/26/2019  . GERD (gastroesophageal reflux disease) 04/28/2018  . Pure hypercholesterolemia 04/28/2018  . Diverticulosis 04/28/2018  . RLS (restless legs syndrome) 04/28/2018  . Angina pectoris ([place]) 04/28/2018  . Vitamin D deficiency 12/27/2017  . Chronic fatigue, unspecified 12/27/2017  . Intractable migraine without aura and without status migrainosus 10/17/2015  . DDD (degenerative disc disease), cervical 05/23/2015  . Bilateral occipital neuralgia 05/23/2015  . DDD (degenerative disc disease), lumbar 05/23/2015  . Multiple sclerosis ([place]) 05/23/2015  . Cervical disc disorder with radiculopathy of cervical region 04/20/2015  . Right arm numbness 04/20/2015  . Right arm weakness 04/20/2015    Jaynie Crumble, SPT 03/14/2021, 5:14 PM  [facility] [address], [place], [address] Phone: [phone]   Fax:  [phone]  Name: Ashlee Richardson MRN: [id number] Date of Birth: August 02, 1956

## 2021-03-17 NOTE — Progress Notes (Signed)
Name: Ashlee Richardson   MRN: [id number]    DOB: [birth year]   Date:03/20/2021       Progress Note  Subjective  Chief Complaint  Follow up   HPI   MS: under the care of Dr. Manuella Ghazi , had a positive IG for herpes on spinal tap but was given reassurance by Dr. Ola Spurr. She denies weakness, but has intermittent tingling an numbness on both arms. Stable and not on medication. MRI showed white plaques. Shehas some tremors and sometimes decrease in memory, on duloxetine , she was given Aricept for her memory but she stopped taking it. Explained importance of compliance. She states she does not like taking all this medications.   Chronic back pain and neck pain, also has FMS: used to see Dr. Primus Bravo. She went to [facility] with severe back pain and feeling bloated early March, since than she was seen by Dr. Lorin Mercy and per his note the two-level fusion looks good but has progressive degenerative disease above it, and she will start PT and may need more surgery. She took prednisone a couple of weeks ago and pain improved, but she states symptoms are returning now.   GERD and gastritis: seen by Dr. Marius Ditch, taking Dexilant and symptoms are controlled. She was diagnosed with hiatal hernia, she also has constipation, she tried Linzess but caused diarrhea, she was advised to take Miralax instead, but she has only been sporadically and only having bowel movements about once a week.   Insomnia: she states Trazodone helps her sleep, no side effects, she does not like taking medications   RLS: taking Requip from neurologist, she states she continues to have cramps, but is not taking medications as prescribed   New Onset DM: A1C was 6.6 % back in September and is again elevated, explained that we can try starting Metformin or monitor it since A1C is at goal She denies polyphagia, polydipsia but she has urinary frequency. She does not want to take medications at this time. She is already on statin therapy.   Angina  Pectoris: seen by Dr. Clayborn Bigness, she has been able to tolerate Rosuvastatin and Zetia, last LDL at goal. Unable to take lipitor. . She denies any recent episodes of chest pain, she has SOB with activity, she told me today it seems to be getting worse.   FMS: she has aches and pains all day, 8/10 on duloxetine, she could not tolerate Gabapentin, she is taking Lyrica prn, explained she needs to take it daily   Migraine headaches: she sees neurologist , she was given an injectable medication but she stopped taking it, having a migraine episode today, did not take imitrex, Pain is like a band around her head, she has photophobia and phonophobia.   B12/Vitamin D deficiency: advised to only take b12 a few times a week, continue vitamin D daily  Nocturnal wheezing: going on for months, sometimes she has a dry cough, she never smoked, no history of asthma. She states wheezing started after an URI and has not resolved since. She is willing to have CXR, and would like to try an inhaler    Patient Active Problem List   Diagnosis Date Noted  . Lumbar foraminal stenosis 03/08/2021  . Myalgia due to statin 05/26/2020  . History of lumbar fusion 11/22/2019  . Pre-diabetes 10/26/2019  . GERD (gastroesophageal reflux disease) 04/28/2018  . Pure hypercholesterolemia 04/28/2018  . Diverticulosis 04/28/2018  . RLS (restless legs syndrome) 04/28/2018  . Angina pectoris ([place]) 04/28/2018  .  Vitamin D deficiency 12/27/2017  . Chronic fatigue, unspecified 12/27/2017  . Intractable migraine without aura and without status migrainosus 10/17/2015  . DDD (degenerative disc disease), cervical 05/23/2015  . Bilateral occipital neuralgia 05/23/2015  . DDD (degenerative disc disease), lumbar 05/23/2015  . Multiple sclerosis ([place]) 05/23/2015  . Cervical disc disorder with radiculopathy of cervical region 04/20/2015  . Right arm numbness 04/20/2015  . Right arm weakness 04/20/2015    Past Surgical History:   Procedure Laterality Date  . BACK SURGERY  1991?   Pt usure of date  . BREAST BIOPSY Right   . COLONOSCOPY  09/2007  . COLONOSCOPY WITH PROPOFOL N/A 08/28/2017   Procedure: COLONOSCOPY WITH PROPOFOL;  Surgeon: Christene Lye, MD;  Location: [facility] ENDOSCOPY;  Service: Endoscopy;  Laterality: N/A;  . ESOPHAGOGASTRODUODENOSCOPY (EGD) WITH PROPOFOL N/A 11/24/2018   Procedure: ESOPHAGOGASTRODUODENOSCOPY (EGD) WITH PROPOFOL;  Surgeon: Lin Landsman, MD;  Location: [facility] ENDOSCOPY;  Service: Gastroenterology;  Laterality: N/A;  . Foot sugery Right    3rd toe from the right"    Family History  Problem Relation Age of Onset  . Heart disease Mother   . Diabetes Mother   . Kidney disease Mother   . Hypertension Mother   . Heart disease Father   . Hyperlipidemia Father   . Hypertension Father   . Heart disease Other   . Cancer Other        Breast  . Diabetes Other   . Hyperlipidemia Sister   . Hypertension Sister   . Kidney disease Sister   . Diabetes Sister   . Lung cancer Paternal Aunt   . Cancer Maternal Grandmother        breast    Social History   Tobacco Use  . Smoking status: Never Smoker  . Smokeless tobacco: Never Used  . Tobacco comment: smoking cessation materials not required  Substance Use Topics  . Alcohol use: No    Alcohol/week: 0.0 standard drinks     Current Outpatient Medications:  .  Calcium-Magnesium-Vitamin D 300-150-400 MG-MG-UNIT TABS, Take by mouth., Disp: , Rfl:  .  cholecalciferol (VITAMIN D3) 25 MCG (1000 UT) tablet, Take 1,000 Units by mouth daily., Disp: , Rfl:  .  DEXILANT 60 MG capsule, TAKE 1 CAPSULE BY MOUTH EVERY DAY, Disp: 90 capsule, Rfl: 0 .  Diclofenac-miSOPROStol 75-0.2 MG TBEC, Take by mouth., Disp: , Rfl:  .  donepezil (ARICEPT) 5 MG tablet, Take 1 tablet by mouth daily., Disp: , Rfl:  .  DULoxetine (CYMBALTA) 60 MG capsule, Take 60 mg by mouth daily., Disp: , Rfl:  .  erythromycin ophthalmic ointment, , Disp: , Rfl:  .   ezetimibe (ZETIA) 10 MG tablet, TAKE 1 TABLET BY MOUTH EVERY DAY, Disp: 90 tablet, Rfl: 1 .  ferrous sulfate 325 (65 FE) MG EC tablet, Take 1 tablet by mouth daily with breakfast., Disp: , Rfl:  .  magnesium oxide (MAG-OX) 400 MG tablet, Take 400 mg by mouth daily., Disp: , Rfl:  .  meclizine (ANTIVERT) 25 MG tablet, Take 25 mg by mouth 3 (three) times daily as needed for dizziness., Disp: , Rfl:  .  meloxicam (MOBIC) 15 MG tablet, TAKE 1 TABLET BY MOUTH EVERY DAY, Disp: 90 tablet, Rfl: 0 .  mirabegron ER (MYRBETRIQ) 50 MG TB24 tablet, Take 1 tablet (50 mg total) by mouth daily., Disp: 30 tablet, Rfl: 11 .  MURO 128 5 % ophthalmic solution, INSTILL 1 DROP IN RIGHT EYE THREE TIMES PER DAY, Disp: ,  Rfl:  .  pregabalin (LYRICA) 50 MG capsule, Take 1-2 capsules (50-100 mg total) by mouth at bedtime., Disp: 60 capsule, Rfl: 2 .  rosuvastatin (CRESTOR) 20 MG tablet, TAKE 1 TABLET BY MOUTH EVERY DAY, Disp: 90 tablet, Rfl: 1 .  SUMAtriptan (IMITREX) 100 MG tablet, Take as directed PRN migraines, Disp: , Rfl:  .  terbinafine (LAMISIL) 250 MG tablet, Take 1 tablet (250 mg total) by mouth daily., Disp: 90 tablet, Rfl: 0 .  tiZANidine (ZANAFLEX) 2 MG tablet, TAKE 1 TABLET (2 MG TOTAL) BY MOUTH 3 (THREE) TIMES DAILY., Disp: , Rfl:  .  traZODone (DESYREL) 50 MG tablet, TAKE 1/2 TO 1 TABLET BY MOUTH AT BEDTIME AS NEEDED FOR SLEEP, Disp: 90 tablet, Rfl: 0 .  vitamin B-12 (CYANOCOBALAMIN) 1000 MCG tablet, Take 1,000 mcg by mouth daily., Disp: , Rfl:  .  fluticasone (FLONASE) 50 MCG/ACT nasal spray, SPRAY 1 SPRAY INTO EACH NOSTRIL EVERY DAY (Patient not taking: No sig reported), Disp: , Rfl: 0  Allergies  Allergen Reactions  . Fenofibric Acid   . Lipitor [Atorvastatin]   . Nsaids     I personally reviewed active problem list, medication list, allergies, family history, social history, health maintenance with the patient/caregiver today.   ROS  Constitutional: Negative for fever or weight change.   Respiratory: Negative for cough and shortness of breath.   Cardiovascular: Negative for chest pain or palpitations.  Gastrointestinal: positive  for abdominal pain, no bowel changes.  Musculoskeletal: positive  for gait problem but no  joint swelling.  Skin: Negative for rash.  Neurological: Negative for dizziness or headache.  No other specific complaints in a complete review of systems (except as listed in HPI above).  Objective  Vitals:   03/20/21 0737  BP: 138/70  Pulse: 85  Resp: 16  Temp: 98.1 F (36.7 C)  TempSrc: Oral  SpO2: 99%  Weight: 188 lb (85.3 kg)  Height: 5\' 8"  (1.727 m)    Body mass index is 28.59 kg/m.  Physical Exam  Constitutional: Patient appears well-developed and well-nourished. She seemed in pain, she hurts all over. She usually complains of pain when she comes in, but seems worse today, holding her back HEENT: head atraumatic, normocephalic, pupils equal and reactive to light,  neck supple Cardiovascular: Normal rate, regular rhythm and normal heart sounds.  No murmur heard. No BLE edema. Pulmonary/Chest: Effort normal and breath sounds normal. No respiratory distress. Abdominal: Soft.  There is abdominal tenderness, worse on epigastric and left side ,no rebound tenderness, possibly from constipation, recent CT done for similar symptoms a few weeks ago . Psychiatric: Patient has a normal mood and affect. behavior is normal. Judgment and thought content normal.  Recent Results (from the past 2160 hour(s))  Basic metabolic panel     Status: Abnormal   Collection Time: 12/22/20  7:54 AM  Result Value Ref Range   Sodium 140 135 - 145 mmol/L   Potassium 3.8 3.5 - 5.1 mmol/L   Chloride 103 98 - 111 mmol/L   CO2 29 22 - 32 mmol/L   Glucose, Bld 116 (H) 70 - 99 mg/dL    Comment: Glucose reference range applies only to samples taken after fasting for at least 8 hours.   BUN 11 8 - 23 mg/dL   Creatinine, Ser 0.73 0.44 - 1.00 mg/dL   Calcium 9.6 8.9 -  10.3 mg/dL   GFR, Estimated >60 >60 mL/min    Comment: (NOTE) Calculated using the CKD-EPI Creatinine Equation (2021)  Anion gap 8 5 - 15    Comment: Performed at [facility], [address] [place], [place] [address]  CBC     Status: None   Collection Time: 12/22/20  7:54 AM  Result Value Ref Range   WBC 4.7 4.0 - 10.5 K/uL   RBC 4.72 3.87 - 5.11 MIL/uL   Hemoglobin 12.9 12.0 - 15.0 g/dL   HCT 40.3 36.0 - 46.0 %   MCV 85.4 80.0 - 100.0 fL   MCH 27.3 26.0 - 34.0 pg   MCHC 32.0 30.0 - 36.0 g/dL   RDW 13.4 11.5 - 15.5 %   Platelets 193 150 - 400 K/uL   nRBC 0.0 0.0 - 0.2 %    Comment: Performed at [facility], [address] [place], [place] [address]  Urinalysis, Complete w Microscopic Urine, Clean Catch     Status: Abnormal   Collection Time: 12/22/20  7:55 AM  Result Value Ref Range   Color, Urine YELLOW (A) YELLOW   APPearance CLEAR (A) CLEAR   Specific Gravity, Urine 1.019 1.005 - 1.030   pH 5.0 5.0 - 8.0   Glucose, UA NEGATIVE NEGATIVE mg/dL   Hgb urine dipstick SMALL (A) NEGATIVE   Bilirubin Urine NEGATIVE NEGATIVE   Ketones, ur NEGATIVE NEGATIVE mg/dL   Protein, ur NEGATIVE NEGATIVE mg/dL   Nitrite NEGATIVE NEGATIVE   Leukocytes,Ua TRACE (A) NEGATIVE   RBC / HPF 0-5 0 - 5 RBC/hpf   WBC, UA 0-5 0 - 5 WBC/hpf   Bacteria, UA NONE SEEN NONE SEEN   Squamous Epithelial / LPF 0-5 0 - 5   Mucus PRESENT     Comment: Performed at [facility], [address] [place], [place] [address]  Hepatic function panel     Status: Abnormal   Collection Time: 01/04/21  2:36 PM  Result Value Ref Range   Total Protein 7.6 6.0 - 8.5 g/dL   Albumin 5.1 (H) 3.8 - 4.8 g/dL   Bilirubin Total 0.2 0.0 - 1.2 mg/dL   Bilirubin, Direct <0.10 0.00 - 0.40 mg/dL   Alkaline Phosphatase 80 44 - 121 IU/L    Comment:               **Please note reference interval change**   AST 15 0 - 40 IU/L   ALT 15 0 - 32 IU/L  Urinalysis, Complete w Microscopic Urine, Clean  Catch     Status: Abnormal   Collection Time: 03/01/21 10:10 AM  Result Value Ref Range   Color, Urine YELLOW (A) YELLOW   APPearance CLEAR (A) CLEAR   Specific Gravity, Urine 1.012 1.005 - 1.030   pH 7.0 5.0 - 8.0   Glucose, UA NEGATIVE NEGATIVE mg/dL   Hgb urine dipstick NEGATIVE NEGATIVE   Bilirubin Urine NEGATIVE NEGATIVE   Ketones, ur NEGATIVE NEGATIVE mg/dL   Protein, ur NEGATIVE NEGATIVE mg/dL   Nitrite NEGATIVE NEGATIVE   Leukocytes,Ua NEGATIVE NEGATIVE   RBC / HPF 0-5 0 - 5 RBC/hpf   WBC, UA 0-5 0 - 5 WBC/hpf   Bacteria, UA NONE SEEN NONE SEEN   Squamous Epithelial / LPF 0-5 0 - 5   Mucus PRESENT     Comment: Performed at [facility], [address] [place], [place] [address]   Diabetic Foot Exam - Simple   Simple Foot Form Diabetic Foot exam was performed with the following findings: Yes 03/20/2021  8:15 AM  Visual Inspection See comments: Yes Sensation Testing See comments:  Yes Pulse Check Posterior Tibialis and Dorsalis pulse intact bilaterally: Yes Comments She states foot is numb from back and possible MS Hammer toe and corn .callus formation on right foot  Top of toes    PHQ2/9: Depression screen [facility] 2/9 03/20/2021 12/14/2020 08/11/2020 01/08/2020 12/04/2019  Decreased Interest 0 0 0 2 0  Down, Depressed, Hopeless 0 0 1 2 0  PHQ - 2 Score 0 0 1 4 0  Altered sleeping - - 3 0 2  Tired, decreased energy - - 3 3 2   Change in appetite - - 0 0 2  Feeling bad or failure about yourself  - - 0 0 0  Trouble concentrating - - 0 0 0  Moving slowly or fidgety/restless - - 0 0 0  Suicidal thoughts - - 0 0 0  PHQ-9 Score - - 7 7 6   Difficult doing work/chores - - Somewhat difficult Somewhat difficult Somewhat difficult  Some recent data might be hidden    phq 9 is negative   Fall Risk: Fall Risk  03/20/2021 12/14/2020 09/20/2020 08/11/2020 01/08/2020  Falls in the past year? 0 0 0 0 0  Number falls in past yr: 0 0 0 0 0  Injury with Fall? 0 0 0 0 0   Comment - - - - -  Risk Factor Category  - - - - -  Risk for fall due to : - - - No Fall Risks -  Risk for fall due to: Comment - - - - -  Follow up - - - Falls prevention discussed -     Functional Status Survey: Is the patient deaf or have difficulty hearing?: No Does the patient have difficulty seeing, even when wearing glasses/contacts?: Yes Does the patient have difficulty concentrating, remembering, or making decisions?: Yes Does the patient have difficulty walking or climbing stairs?: Yes Does the patient have difficulty dressing or bathing?: No Does the patient have difficulty doing errands alone such as visiting a doctor's office or shopping?: No    Assessment & Plan  1. Angina pectoris ([place])  Keep follow up with cardiologist   2. Dyslipidemia  On statin but not very compliant   3. Vitamin D deficiency  Continue supplementation   4. Onychomycosis of multiple toenails with type 2 diabetes mellitus ([place])  Seen by Podiatrist and is on Lamisil   5. Diabetes mellitus type 2, diet-controlled ([place])  - POCT HgB A1C - POCT UA - Microalbumin  6. Fibromyalgia  Resume Lyrica   7. Atherosclerosis of aorta ([facility])  Review CT stone done at [facility], keep LDL below 70  8. RLS (restless legs syndrome)  Needs to resume medications   9. Multiple sclerosis ([place])  Keep follow up with neurologist   10. Chronic bilateral low back pain with right-sided sciatica   11. Type 2 diabetes mellitus with pressure callus ([facility])  On right foot, hammer toes   12. Chronic idiopathic constipation  Could not tolerate Linzess, advised to resume miralax  13. Bloating  - H. pylori breath test She has a history of diverticulitis but states it does not feel like it   14. Wheezing  - Fluticasone-Salmeterol (ADVAIR DISKUS) 100-50 MCG/DOSE AEPB; Inhale 1 puff into the lungs in the morning and at bedtime.  Dispense: 60 each; Refill: 3  15. Post-viral cough syndrome  -  Fluticasone-Salmeterol (ADVAIR DISKUS) 100-50 MCG/DOSE AEPB; Inhale 1 puff into the lungs in the morning and at bedtime.  Dispense: 60 each; Refill: 3

## 2021-03-20 ENCOUNTER — Other Ambulatory Visit: Payer: Self-pay

## 2021-03-20 ENCOUNTER — Encounter: Payer: Self-pay | Admitting: Family Medicine

## 2021-03-20 ENCOUNTER — Ambulatory Visit (INDEPENDENT_AMBULATORY_CARE_PROVIDER_SITE_OTHER): Payer: Medicare Other | Admitting: Family Medicine

## 2021-03-20 VITALS — BP 138/70 | HR 85 | Temp 98.1°F | Resp 16 | Ht 68.0 in | Wt 188.0 lb

## 2021-03-20 DIAGNOSIS — M797 Fibromyalgia: Secondary | ICD-10-CM | POA: Diagnosis not present

## 2021-03-20 DIAGNOSIS — R140 Abdominal distension (gaseous): Secondary | ICD-10-CM

## 2021-03-20 DIAGNOSIS — E119 Type 2 diabetes mellitus without complications: Secondary | ICD-10-CM | POA: Diagnosis not present

## 2021-03-20 DIAGNOSIS — E559 Vitamin D deficiency, unspecified: Secondary | ICD-10-CM | POA: Diagnosis not present

## 2021-03-20 DIAGNOSIS — I209 Angina pectoris, unspecified: Secondary | ICD-10-CM

## 2021-03-20 DIAGNOSIS — I700 Atherosclerosis of aorta: Secondary | ICD-10-CM

## 2021-03-20 DIAGNOSIS — G35 Multiple sclerosis: Secondary | ICD-10-CM

## 2021-03-20 DIAGNOSIS — E11628 Type 2 diabetes mellitus with other skin complications: Secondary | ICD-10-CM | POA: Diagnosis not present

## 2021-03-20 DIAGNOSIS — M5441 Lumbago with sciatica, right side: Secondary | ICD-10-CM | POA: Diagnosis not present

## 2021-03-20 DIAGNOSIS — G8929 Other chronic pain: Secondary | ICD-10-CM

## 2021-03-20 DIAGNOSIS — G2581 Restless legs syndrome: Secondary | ICD-10-CM | POA: Diagnosis not present

## 2021-03-20 DIAGNOSIS — E785 Hyperlipidemia, unspecified: Secondary | ICD-10-CM

## 2021-03-20 DIAGNOSIS — R062 Wheezing: Secondary | ICD-10-CM

## 2021-03-20 DIAGNOSIS — E1169 Type 2 diabetes mellitus with other specified complication: Secondary | ICD-10-CM | POA: Diagnosis not present

## 2021-03-20 DIAGNOSIS — K5904 Chronic idiopathic constipation: Secondary | ICD-10-CM | POA: Diagnosis not present

## 2021-03-20 DIAGNOSIS — R058 Other specified cough: Secondary | ICD-10-CM

## 2021-03-20 DIAGNOSIS — B351 Tinea unguium: Secondary | ICD-10-CM

## 2021-03-20 DIAGNOSIS — L84 Corns and callosities: Secondary | ICD-10-CM

## 2021-03-20 LAB — POCT GLYCOSYLATED HEMOGLOBIN (HGB A1C): Hemoglobin A1C: 6.6 % — AB (ref 4.0–5.6)

## 2021-03-20 LAB — POCT UA - MICROALBUMIN: Microalbumin Ur, POC: 50 mg/L

## 2021-03-20 MED ORDER — FLUTICASONE-SALMETEROL 100-50 MCG/DOSE IN AEPB: 1.0000 | INHALATION_SPRAY | Freq: Two times a day (BID) | RESPIRATORY_TRACT | 3 refills | Status: DC

## 2021-03-21 ENCOUNTER — Ambulatory Visit: Payer: Medicare Other

## 2021-03-21 DIAGNOSIS — G8929 Other chronic pain: Secondary | ICD-10-CM

## 2021-03-21 DIAGNOSIS — M6281 Muscle weakness (generalized): Secondary | ICD-10-CM

## 2021-03-21 DIAGNOSIS — M542 Cervicalgia: Secondary | ICD-10-CM | POA: Diagnosis not present

## 2021-03-21 DIAGNOSIS — M5441 Lumbago with sciatica, right side: Secondary | ICD-10-CM | POA: Diagnosis not present

## 2021-03-21 DIAGNOSIS — R293 Abnormal posture: Secondary | ICD-10-CM

## 2021-03-21 DIAGNOSIS — M5412 Radiculopathy, cervical region: Secondary | ICD-10-CM

## 2021-03-21 DIAGNOSIS — M5442 Lumbago with sciatica, left side: Secondary | ICD-10-CM | POA: Diagnosis not present

## 2021-03-21 DIAGNOSIS — M62830 Muscle spasm of back: Secondary | ICD-10-CM | POA: Diagnosis not present

## 2021-03-21 LAB — H. PYLORI BREATH TEST: H. pylori Breath Test: NOT DETECTED

## 2021-03-21 NOTE — Therapy (Signed)
[place] [address], [place], [address] Phone: [phone]   Fax:  [phone]  Physical Therapy Treatment  Patient Details  Name: Ashlee Richardson MRN: [id number] Date of Birth: 1956-02-04 Referring Provider (PT): Lorin Mercy MD   Encounter Date: 03/21/2021   PT End of Session - 03/21/21 1646    Visit Number 2    Number of Visits 17    Date for PT Re-Evaluation 05/02/21    PT Start Time 2202    PT Stop Time 1602    PT Time Calculation (min) 47 min    Activity Tolerance Patient tolerated treatment well;Patient limited by pain    Behavior During Therapy [facility] for tasks assessed/performed           Past Medical History:  Diagnosis Date  . B12 deficiency 12/27/2017  . Diverticulitis   . Fibromyalgia   . GERD (gastroesophageal reflux disease)   . High cholesterol   . Migraine   . MS (multiple sclerosis) ([place])   . PUD (peptic ulcer disease) 04/28/2018    Past Surgical History:  Procedure Laterality Date  . BACK SURGERY  1991?   Pt usure of date  . BREAST BIOPSY Right   . COLONOSCOPY  09/2007  . COLONOSCOPY WITH PROPOFOL N/A 08/28/2017   Procedure: COLONOSCOPY WITH PROPOFOL;  Surgeon: Christene Lye, MD;  Location: [facility] ENDOSCOPY;  Service: Endoscopy;  Laterality: N/A;  . ESOPHAGOGASTRODUODENOSCOPY (EGD) WITH PROPOFOL N/A 11/24/2018   Procedure: ESOPHAGOGASTRODUODENOSCOPY (EGD) WITH PROPOFOL;  Surgeon: Lin Landsman, MD;  Location: [facility] ENDOSCOPY;  Service: Gastroenterology;  Laterality: N/A;  . Foot sugery Right    3rd toe from the right"    There were no vitals filed for this visit.   Subjective Assessment - 03/21/21 1643    Subjective Patient reports that she has some low back pain today especially on the left side. Patient reports that she is going to get some diagnostic  imaging done soon.    Pertinent History Back surgery    Limitations Sitting;Lifting    How long can you sit  comfortably? 5 minutes    How long can you walk comfortably? 2 miles    Patient Stated Goals To return to physical activity and gym exercises    Currently in Pain? Yes    Pain Score 6     Pain Location Back    Pain Orientation Left    Pain Descriptors / Indicators Aching    Pain Type Chronic pain    Pain Onset Other (comment)   3 months ago   Pain Onset More than a month ago    Pain Onset More than a month ago           TREATMENT Therapeutic Exercise  Lumbar extension in standing x 5  Pelvic tilt in sitting x 5 Hook lying pelvic tilt x 8  Bridges x 8 Prone extension on elbows 2 x 10  Hip abduction in standing B 2 x 5 Hip extension in standing B 2 x 5  Leg extension machine 35# 2 x 10  Leg press machine 75# 2 x 10  Rotary hip machine adduction 25# 2 x 10  Treadmill  2.0 speed x 5.5 mins   Performed to address pain and spasms         PT Education - 03/21/21 1645    Education Details form technique w/ exercise    Person(s) Educated Patient    Methods Explanation;Demonstration  Comprehension Returned demonstration;Verbalized understanding            PT Short Term Goals - 03/14/21 1643      PT SHORT TERM GOAL #1   Title Patient will improve FOTO score from 57 to 60 to indicate improvement with ambulation.    Time 2    Period Weeks    Status New    Target Date 03/28/21             PT Long Term Goals - 03/14/21 1650      PT LONG TERM GOAL #1   Title Patient will be demonstrate a decrease in worse pain from 10/10 to 7/10 to be able sit for a prolonged period of time.    Time 8    Period Weeks    Status New      PT LONG TERM GOAL #2   Title Patient will improve her lumbar range of motion to 50% of normal limits to be able to return to exercise at the gym    Time 8    Period Weeks    Status New    Target Date 05/09/21      PT LONG TERM GOAL #3   Title Patient will be able to comfortably sit for 45 minutes to be able to perform occupational tasks.     Time 8    Period Weeks    Status New    Target Date 05/09/21                 Plan - 03/21/21 1648    Clinical Impression Statement Patient demonstrates a decrease in low back pain since initial session. However, patient has difficulty performing prone extension exercises limited by pain and motion. Patient will benefit from skilled therapy to return to prior level of function.    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/Moderate complexity    Rehab Potential Fair    PT Frequency 2x / week    PT Duration 8 weeks    PT Treatment/Interventions Therapeutic exercise;Therapeutic activities;Patient/family education;Passive range of motion;Manual techniques;Dry needling    PT Next Visit Plan Work on stregntening paraspinal muscles and glutes and increasing lumbar extension    PT Home Exercise Plan standing lumbar extension, sit to stand, pelvic tilt    Consulted and Agree with Plan of Care Patient           Patient will benefit from skilled therapeutic intervention in order to improve the following deficits and impairments:  Pain,Hypomobility,Decreased strength,Decreased range of motion,Decreased endurance,Decreased activity tolerance,Increased edema  Visit Diagnosis: Chronic bilateral low back pain with bilateral sciatica  Muscle spasm of back  Cervicalgia  Muscle weakness (generalized)  Radiculopathy, cervical region  Abnormal posture     Problem List Patient Active Problem List   Diagnosis Date Noted  . Type 2 diabetes mellitus with pressure callus ([place]) 03/20/2021  . Diabetes mellitus type 2, diet-controlled ([place]) 03/20/2021  . Onychomycosis of  multiple toenails with type 2 diabetes mellitus ([place]) 03/20/2021  . Chronic idiopathic constipation 03/20/2021  . Lumbar foraminal stenosis 03/08/2021  . Myalgia due to statin 05/26/2020  . History of lumbar fusion 11/22/2019  . GERD (gastroesophageal reflux disease) 04/28/2018  . Pure hypercholesterolemia 04/28/2018  . Diverticulosis 04/28/2018  . RLS (restless legs syndrome)  04/28/2018  . Angina pectoris ([place]) 04/28/2018  . Vitamin D deficiency 12/27/2017  . Chronic fatigue, unspecified 12/27/2017  . Intractable migraine without aura and without status migrainosus 10/17/2015  . DDD (degenerative disc disease), cervical 05/23/2015  . Bilateral occipital neuralgia 05/23/2015  . DDD (degenerative disc disease), lumbar 05/23/2015  . Multiple sclerosis ([place]) 05/23/2015  . Cervical disc disorder with radiculopathy of cervical region 04/20/2015  . Right arm numbness 04/20/2015  . Right arm weakness 04/20/2015    Jaynie Crumble, SPT 03/21/2021, 4:56 PM  [place] [facility] [address], [place], [address] Phone: [phone]   Fax:  [phone]  Name: Ashlee Richardson MRN: [id number] Date of Birth: 1956-11-18

## 2021-03-22 ENCOUNTER — Encounter: Payer: Self-pay | Admitting: Family Medicine

## 2021-03-22 ENCOUNTER — Other Ambulatory Visit: Payer: Self-pay | Admitting: Family Medicine

## 2021-03-22 DIAGNOSIS — R1032 Left lower quadrant pain: Secondary | ICD-10-CM

## 2021-03-23 ENCOUNTER — Other Ambulatory Visit: Payer: Self-pay

## 2021-03-23 ENCOUNTER — Ambulatory Visit: Payer: Medicare Other

## 2021-03-23 DIAGNOSIS — M62830 Muscle spasm of back: Secondary | ICD-10-CM | POA: Diagnosis not present

## 2021-03-23 DIAGNOSIS — M5442 Lumbago with sciatica, left side: Secondary | ICD-10-CM | POA: Diagnosis not present

## 2021-03-23 DIAGNOSIS — M542 Cervicalgia: Secondary | ICD-10-CM | POA: Diagnosis not present

## 2021-03-23 DIAGNOSIS — M6281 Muscle weakness (generalized): Secondary | ICD-10-CM | POA: Diagnosis not present

## 2021-03-23 DIAGNOSIS — M5441 Lumbago with sciatica, right side: Secondary | ICD-10-CM | POA: Diagnosis not present

## 2021-03-23 DIAGNOSIS — G8929 Other chronic pain: Secondary | ICD-10-CM | POA: Diagnosis not present

## 2021-03-23 DIAGNOSIS — R293 Abnormal posture: Secondary | ICD-10-CM | POA: Diagnosis not present

## 2021-03-23 DIAGNOSIS — M5412 Radiculopathy, cervical region: Secondary | ICD-10-CM | POA: Diagnosis not present

## 2021-03-23 NOTE — Therapy (Signed)
[facility] [address], [place], [address] Phone: [phone]   Fax:  [phone]  Physical Therapy Treatment  Patient Details  Name: Ashlee Richardson MRN: [id number] Date of Birth: [birth year] Referring Provider (PT): Lorin Mercy MD   Encounter Date: 03/23/2021   PT End of Session - 03/23/21 1002    Visit Number 3    Number of Visits 17    Date for PT Re-Evaluation 05/02/21    PT Start Time 0730    PT Stop Time 0815    PT Time Calculation (min) 45 min    Activity Tolerance Patient tolerated treatment well;Patient limited by pain    Behavior During Therapy [facility] for tasks assessed/performed           Past Medical History:  Diagnosis Date  . B12 deficiency 12/27/2017  . Diverticulitis   . Fibromyalgia   . GERD (gastroesophageal reflux disease)   . High cholesterol   . Migraine   . MS (multiple sclerosis) ([place])   . PUD (peptic ulcer disease) 04/28/2018    Past Surgical History:  Procedure Laterality Date  . BACK SURGERY  1991?   Pt usure of date  . BREAST BIOPSY Right   . COLONOSCOPY  09/2007  . COLONOSCOPY WITH PROPOFOL N/A 08/28/2017   Procedure: COLONOSCOPY WITH PROPOFOL;  Surgeon: Christene Lye, MD;  Location: [facility] ENDOSCOPY;  Service: Endoscopy;  Laterality: N/A;  . ESOPHAGOGASTRODUODENOSCOPY (EGD) WITH PROPOFOL N/A 11/24/2018   Procedure: ESOPHAGOGASTRODUODENOSCOPY (EGD) WITH PROPOFOL;  Surgeon: Lin Landsman, MD;  Location: [facility] ENDOSCOPY;  Service: Gastroenterology;  Laterality: N/A;  . Foot sugery Right    3rd toe from the right"    There were no vitals filed for this visit.   Subjective Assessment - 03/23/21 0952    Subjective Patient reports that she has an increase in low back pain. Patient states that she may have done too much activity over the past couple of days. Patient states that she is waiting to get a chest X-ray and CT scan soon for a mass on the L side of her low  back.    Pertinent History Back surgery    Limitations Sitting    How long can you sit comfortably? 5 minutes    How long can you walk comfortably? 2 miles    Patient Stated Goals To return to physical activity and gym exercises    Currently in Pain? Yes    Pain Score 8     Pain Location Back    Pain Orientation Left    Pain Descriptors / Indicators Aching    Pain Type Chronic pain    Pain Onset Other (comment)   3 months ago   Pain Onset More than a month ago    Pain Onset More than a month ago           TREATMENT Therapeutic Exercise  Lumbar extension in standing 2 x 10  Hook lying pelvic tilt 2 x 10   Glute bridges 2 x 10  Lower Trunk Rotations x 10 Knee to Chest x 5 Leg extension machine 35# 2 x 10  Rotary hip machine adduction 25# 2 x 10  Treadmill  2.0 speed x 10 mins    Performed to address pain and spasms        PT Education - 03/23/21 1001    Education Details form/technique w/ exercise    Person(s) Educated Patient    Methods Explanation;Demonstration  Comprehension Verbalized understanding;Returned demonstration            PT Short Term Goals - 03/14/21 1643      PT SHORT TERM GOAL #1   Title Patient will improve FOTO score from 57 to 60 to indicate improvement with ambulation.    Time 2    Period Weeks    Status New    Target Date 03/28/21             PT Long Term Goals - 03/14/21 1650      PT LONG TERM GOAL #1   Title Patient will be demonstrate a decrease in worse pain from 10/10 to 7/10 to be able sit for a prolonged period of time.    Time 8    Period Weeks    Status New      PT LONG TERM GOAL #2   Title Patient will improve her lumbar range of motion to 50% of normal limits to be able to return to exercise at the gym    Time 8    Period Weeks    Status New    Target Date 05/09/21      PT LONG TERM GOAL #3   Title Patient will be able to comfortably sit for 45 minutes to be able to perform occupational tasks.    Time 8     Period Weeks    Status New    Target Date 05/09/21                 Plan - 03/23/21 1003    Clinical Impression Statement Patient demonstrates improvement with hip/core muscle strengthening while performing exercises such as glute bridges and rotary hip adduction machine exercises. Patient does have an increase in low back pain aggravated with activities such as LTRs. Patient will benefit from skilled therapy to improve strength and return to prior level of function.    Personal Factors and Comorbidities Age;Comorbidity 3+;Profession    Comorbidities MS, prior surgery, chronic low back pain, chronic neck pain, fibromyalgia    Examination-Activity Limitations Bed Mobility;Squat;Lift;Stairs;Bend;Sit;Sleep    Examination-Participation Restrictions Occupation;Cleaning;Personal Finances;Yard Work    [employer] officer Evolving/Moderate complexity    Rehab Potential Fair    PT Frequency 2x / week    PT Duration 8 weeks    PT Treatment/Interventions Therapeutic exercise;Therapeutic activities;Patient/family education;Passive range of motion;Manual techniques;Dry needling    PT Next Visit Plan Work on stregntening paraspinal muscles and glutes and increasing lumbar extension    PT Home Exercise Plan standing lumbar extension, sit to stand, pelvic tilt    Consulted and Agree with Plan of Care Patient           Patient will benefit from skilled therapeutic intervention in order to improve the following deficits and impairments:  Pain,Hypomobility,Decreased strength,Decreased range of motion,Decreased endurance,Decreased activity tolerance,Increased edema  Visit Diagnosis: Muscle spasm of back  Chronic bilateral low back pain with bilateral sciatica  Muscle weakness (generalized)     Problem List Patient Active Problem List   Diagnosis Date Noted  . Type 2 diabetes mellitus with pressure callus ([place]) 03/20/2021  . Diabetes mellitus type 2, diet-controlled ([place])  03/20/2021  . Onychomycosis of multiple toenails with type 2 diabetes mellitus ([place]) 03/20/2021  . Chronic idiopathic constipation 03/20/2021  . Lumbar foraminal stenosis 03/08/2021  . Myalgia due to statin 05/26/2020  . History of lumbar fusion 11/22/2019  . GERD (gastroesophageal reflux disease) 04/28/2018  . Pure hypercholesterolemia 04/28/2018  . Diverticulosis 04/28/2018  .  RLS (restless legs syndrome) 04/28/2018  . Angina pectoris ([place]) 04/28/2018  . Vitamin D deficiency 12/27/2017  . Chronic fatigue, unspecified 12/27/2017  . Intractable migraine without aura and without status migrainosus 10/17/2015  . DDD (degenerative disc disease), cervical 05/23/2015  . Bilateral occipital neuralgia 05/23/2015  . DDD (degenerative disc disease), lumbar 05/23/2015  . Multiple sclerosis ([place]) 05/23/2015  . Cervical disc disorder with radiculopathy of cervical region 04/20/2015  . Right arm numbness 04/20/2015  . Right arm weakness 04/20/2015    Jaynie Crumble, SPT 03/23/2021, 10:14 AM  [facility] [address] [place], [address] Phone: [phone]   Fax:  [phone]  Name: Ashlee Richardson MRN: [id number] Date of Birth: [birth year]

## 2021-03-28 ENCOUNTER — Ambulatory Visit: Payer: Medicare Other | Attending: Orthopaedic Surgery

## 2021-03-28 ENCOUNTER — Other Ambulatory Visit: Payer: Self-pay

## 2021-03-28 DIAGNOSIS — M62830 Muscle spasm of back: Secondary | ICD-10-CM

## 2021-03-28 DIAGNOSIS — G8929 Other chronic pain: Secondary | ICD-10-CM

## 2021-03-28 DIAGNOSIS — R293 Abnormal posture: Secondary | ICD-10-CM

## 2021-03-28 DIAGNOSIS — M5442 Lumbago with sciatica, left side: Secondary | ICD-10-CM | POA: Insufficient documentation

## 2021-03-28 DIAGNOSIS — M6281 Muscle weakness (generalized): Secondary | ICD-10-CM | POA: Diagnosis not present

## 2021-03-28 DIAGNOSIS — M5441 Lumbago with sciatica, right side: Secondary | ICD-10-CM | POA: Diagnosis not present

## 2021-03-28 NOTE — Therapy (Signed)
[facility] [address], [place], [address] Phone: [phone]   Fax:  [phone]  Physical Therapy Treatment  Patient Details  Name: Ashlee Richardson MRN: [id number] Date of Birth: March 24, 1956 Referring Provider (PT): Lorin Mercy MD   Encounter Date: 03/28/2021   PT End of Session - 03/28/21 0832    Visit Number 4    Number of Visits 17    Date for PT Re-Evaluation 05/02/21    PT Start Time 0730    PT Stop Time 0812    PT Time Calculation (min) 42 min    Activity Tolerance Patient tolerated treatment well;Patient limited by pain    Behavior During Therapy [facility] for tasks assessed/performed           Past Medical History:  Diagnosis Date  . B12 deficiency 12/27/2017  . Diverticulitis   . Fibromyalgia   . GERD (gastroesophageal reflux disease)   . High cholesterol   . Migraine   . MS (multiple sclerosis) ([place])   . PUD (peptic ulcer disease) 04/28/2018    Past Surgical History:  Procedure Laterality Date  . BACK SURGERY  1991?   Pt usure of date  . BREAST BIOPSY Right   . COLONOSCOPY  09/2007  . COLONOSCOPY WITH PROPOFOL N/A 08/28/2017   Procedure: COLONOSCOPY WITH PROPOFOL;  Surgeon: Christene Lye, MD;  Location: [facility] ENDOSCOPY;  Service: Endoscopy;  Laterality: N/A;  . ESOPHAGOGASTRODUODENOSCOPY (EGD) WITH PROPOFOL N/A 11/24/2018   Procedure: ESOPHAGOGASTRODUODENOSCOPY (EGD) WITH PROPOFOL;  Surgeon: Lin Landsman, MD;  Location: [facility] ENDOSCOPY;  Service: Gastroenterology;  Laterality: N/A;  . Foot sugery Right    3rd toe from the right"    There were no vitals filed for this visit.   Subjective Assessment - 03/28/21 0828    Subjective Patient states that she still has an increase in low back pain on the L side. Patient reports that she has a CT scan scheduled on the 14th, and the MD gave her medication to alleviate symptoms.    Pertinent History Back surgery    Limitations Sitting     How long can you sit comfortably? 5 minutes    How long can you walk comfortably? 2 miles    Patient Stated Goals To return to physical activity and gym exercises    Currently in Pain? Yes    Pain Score 7     Pain Location Back    Pain Orientation Right    Pain Descriptors / Indicators Aching    Pain Type Chronic pain    Pain Onset Other (comment)   3 months ago   Pain Onset More than a month ago    Pain Onset More than a month ago           TREATMENT Therapeutic Exercise  Lumbar extension in standing 2 x 10  Hook lying pelvic tilt 2 x 10   Glute bridges 2 x 10  Lower Trunk Rotations x 10 Knee to Chest x 10  Prayer stretch w/ physioball x 20  Prayer stretch w/ physioball x 20  Leg extension machine 35# 3 x 10  Rotary hip machine adduction 25# 2 x 10  Treadmill 2.0 speed x 10 mins     Performed to address pain and spasms      PT Education - 03/28/21 0831    Education Details form/technique with exercise    Person(s) Educated Patient    Methods Explanation;Demonstration    Comprehension  Verbalized understanding;Returned demonstration            PT Short Term Goals - 03/14/21 1643      PT SHORT TERM GOAL #1   Title Patient will improve FOTO score from 57 to 60 to indicate improvement with ambulation.    Time 2    Period Weeks    Status New    Target Date 03/28/21             PT Long Term Goals - 03/14/21 1650      PT LONG TERM GOAL #1   Title Patient will be demonstrate a decrease in worse pain from 10/10 to 7/10 to be able sit for a prolonged period of time.    Time 8    Period Weeks    Status New      PT LONG TERM GOAL #2   Title Patient will improve her lumbar range of motion to 50% of normal limits to be able to return to exercise at the gym    Time 8    Period Weeks    Status New    Target Date 05/09/21      PT LONG TERM GOAL #3   Title Patient will be able to comfortably sit for 45 minutes to be able to perform occupational tasks.     Time 8    Period Weeks    Status New    Target Date 05/09/21                 Plan - 03/28/21 7494    Clinical Impression Statement Patient demonstrates improvement with sit to stand and lying to sitting transfers. Patient was also able to perform more repetitions of glute bridge and lumbar extension exercises. However, patient still has an increase in pain and fatigue after prolonged exercise. Patient will further benefit from skilled therapy to improve strength and return to prior level of function.    Personal Factors and Comorbidities Age;Comorbidity 3+;Profession    Comorbidities MS, prior surgery, chronic low back pain, chronic neck pain, fibromyalgia    Examination-Activity Limitations Bed Mobility;Squat;Lift;Stairs;Bend;Sit;Sleep    Examination-Participation Restrictions Occupation;Cleaning;Personal Finances;Yard Work    [employer] officer Evolving/Moderate complexity    Rehab Potential Fair    PT Frequency 2x / week    PT Duration 8 weeks    PT Treatment/Interventions Therapeutic exercise;Therapeutic activities;Patient/family education;Passive range of motion;Manual techniques;Dry needling    PT Next Visit Plan Work on stregntening paraspinal muscles and glutes and increasing lumbar extension    PT Home Exercise Plan standing lumbar extension, sit to stand, pelvic tilt    Consulted and Agree with Plan of Care Patient           Patient will benefit from skilled therapeutic intervention in order to improve the following deficits and impairments:  Pain,Hypomobility,Decreased strength,Decreased range of motion,Decreased endurance,Decreased activity tolerance,Increased edema  Visit Diagnosis: Muscle spasm of back  Muscle weakness (generalized)  Abnormal posture  Chronic bilateral low back pain with bilateral sciatica     Problem List Patient Active Problem List   Diagnosis Date Noted  . Type 2 diabetes mellitus with pressure callus ([place]) 03/20/2021   . Diabetes mellitus type 2, diet-controlled ([place]) 03/20/2021  . Onychomycosis of multiple toenails with type 2 diabetes mellitus ([place]) 03/20/2021  . Chronic idiopathic constipation 03/20/2021  . Lumbar foraminal stenosis 03/08/2021  . Myalgia due to statin 05/26/2020  . History of lumbar fusion 11/22/2019  . GERD (gastroesophageal reflux disease) 04/28/2018  .  Pure hypercholesterolemia 04/28/2018  . Diverticulosis 04/28/2018  . RLS (restless legs syndrome) 04/28/2018  . Angina pectoris ([place]) 04/28/2018  . Vitamin D deficiency 12/27/2017  . Chronic fatigue, unspecified 12/27/2017  . Intractable migraine without aura and without status migrainosus 10/17/2015  . DDD (degenerative disc disease), cervical 05/23/2015  . Bilateral occipital neuralgia 05/23/2015  . DDD (degenerative disc disease), lumbar 05/23/2015  . Multiple sclerosis ([place]) 05/23/2015  . Cervical disc disorder with radiculopathy of cervical region 04/20/2015  . Right arm numbness 04/20/2015  . Right arm weakness 04/20/2015    Jaynie Crumble, SPT 03/28/2021, 8:47 AM  [facility] [address], [place], [address] Phone: [phone]   Fax:  [phone]  Name: Ashlee Richardson MRN: [id number] Date of Birth: 1956-06-11

## 2021-03-30 ENCOUNTER — Other Ambulatory Visit: Payer: Self-pay

## 2021-03-30 ENCOUNTER — Ambulatory Visit: Payer: Medicare Other

## 2021-03-30 DIAGNOSIS — R293 Abnormal posture: Secondary | ICD-10-CM | POA: Diagnosis not present

## 2021-03-30 DIAGNOSIS — M6281 Muscle weakness (generalized): Secondary | ICD-10-CM | POA: Diagnosis not present

## 2021-03-30 DIAGNOSIS — M5441 Lumbago with sciatica, right side: Secondary | ICD-10-CM | POA: Diagnosis not present

## 2021-03-30 DIAGNOSIS — G8929 Other chronic pain: Secondary | ICD-10-CM

## 2021-03-30 DIAGNOSIS — M5442 Lumbago with sciatica, left side: Secondary | ICD-10-CM

## 2021-03-30 DIAGNOSIS — M62830 Muscle spasm of back: Secondary | ICD-10-CM

## 2021-03-30 NOTE — Therapy (Signed)
[facility] [address] [place], [address] Phone: [phone]   Fax:  [phone]  Physical Therapy Treatment  Patient Details  Name: Ashlee Richardson MRN: [id number] Date of Birth: 1956-08-27 Referring Provider (PT): Lorin Mercy MD   Encounter Date: 03/30/2021   PT End of Session - 03/30/21 0828    Visit Number 5    Number of Visits 17    Date for PT Re-Evaluation 05/02/21    PT Start Time 0735    PT Stop Time 0816    PT Time Calculation (min) 41 min    Activity Tolerance Patient tolerated treatment well;Patient limited by pain    Behavior During Therapy [facility] for tasks assessed/performed           Past Medical History:  Diagnosis Date  . B12 deficiency 12/27/2017  . Diverticulitis   . Fibromyalgia   . GERD (gastroesophageal reflux disease)   . High cholesterol   . Migraine   . MS (multiple sclerosis) ([place])   . PUD (peptic ulcer disease) 04/28/2018    Past Surgical History:  Procedure Laterality Date  . BACK SURGERY  1991?   Pt usure of date  . BREAST BIOPSY Right   . COLONOSCOPY  09/2007  . COLONOSCOPY WITH PROPOFOL N/A 08/28/2017   Procedure: COLONOSCOPY WITH PROPOFOL;  Surgeon: Christene Lye, MD;  Location: [facility] ENDOSCOPY;  Service: Endoscopy;  Laterality: N/A;  . ESOPHAGOGASTRODUODENOSCOPY (EGD) WITH PROPOFOL N/A 11/24/2018   Procedure: ESOPHAGOGASTRODUODENOSCOPY (EGD) WITH PROPOFOL;  Surgeon: Lin Landsman, MD;  Location: [facility] ENDOSCOPY;  Service: Gastroenterology;  Laterality: N/A;  . Foot sugery Right    3rd toe from the right"    There were no vitals filed for this visit.   Subjective Assessment - 03/30/21 0826    Subjective Patient reports that she feels fine today. Patient states that she had a little discomfort after her last session on Tuesday however, she gradually felt better.    Pertinent History Back surgery    Limitations Sitting    How long can you sit comfortably?  5 minutes    How long can you walk comfortably? 2 miles    Patient Stated Goals To return to physical activity and gym exercises    Currently in Pain? No/denies    Pain Onset Other (comment)   3 months ago   Pain Onset More than a month ago    Pain Onset More than a month ago           TREATMENT Therapeutic Exercise   Hook lying pelvic tilt 2 x 10   Glute bridges 2 x 10  Lower Trunk Rotations x 10 Knee to Chest x 10  Prayer stretch w/ physioball x 20  Prayer stretch w/ rotation with physioball x 20  Rotary hip machine adduction 25# 2 x 10  Rotary hip machine extension 25# 2 x 10  Treadmill 2.0 speed x 10 mins      Performed to address pain and spasms        PT Education - 03/30/21 0827    Education Details form/technique with exercise    Person(s) Educated Patient    Methods Explanation;Demonstration    Comprehension Verbalized understanding;Returned demonstration            PT Short Term Goals - 03/14/21 1643      PT SHORT TERM GOAL #1   Title Patient will improve FOTO score from 57 to 60 to indicate improvement  with ambulation.    Time 2    Period Weeks    Status New    Target Date 03/28/21             PT Long Term Goals - 03/14/21 1650      PT LONG TERM GOAL #1   Title Patient will be demonstrate a decrease in worse pain from 10/10 to 7/10 to be able sit for a prolonged period of time.    Time 8    Period Weeks    Status New      PT LONG TERM GOAL #2   Title Patient will improve her lumbar range of motion to 50% of normal limits to be able to return to exercise at the gym    Time 8    Period Weeks    Status New    Target Date 05/09/21      PT LONG TERM GOAL #3   Title Patient will be able to comfortably sit for 45 minutes to be able to perform occupational tasks.    Time 8    Period Weeks    Status New    Target Date 05/09/21                 Plan - 03/30/21 4818    Clinical Impression Statement Overall patient continues to  demonstrate improvement with glute muscle strength while performing bridges. Patient still has some increased pain and symptom reproduction after performing prologned exercises such as rotary machine hip extensions.  Patient will further benefit from skilled therapy to return to prior level of function.    Personal Factors and Comorbidities Age;Comorbidity 3+;Profession    Comorbidities MS, prior surgery, chronic low back pain, chronic neck pain, fibromyalgia    Examination-Activity Limitations Bed Mobility;Squat;Lift;Stairs;Bend;Sit;Sleep    Examination-Participation Restrictions Occupation;Cleaning;Personal Finances;Yard Work    [employer]chant [employer] officer Evolving/Moderate complexity    Rehab Potential Fair    PT Frequency 2x / week    PT Duration 8 weeks    PT Treatment/Interventions Therapeutic exercise;Therapeutic activities;Patient/family education;Passive range of motion;Manual techniques;Dry needling    PT Next Visit Plan Work on stregntening paraspinal muscles and glutes and increasing lumbar extension    PT Home Exercise Plan standing lumbar extension, sit to stand, pelvic tilt    Consulted and Agree with Plan of Care Patient           Patient will benefit from skilled therapeutic intervention in order to improve the following deficits and impairments:  Pain,Hypomobility,Decreased strength,Decreased range of motion,Decreased endurance,Decreased activity tolerance,Increased edema  Visit Diagnosis: Muscle spasm of back  Muscle weakness (generalized)  Chronic bilateral low back pain with bilateral sciatica     Problem List Patient Active Problem List   Diagnosis Date Noted  . Type 2 diabetes mellitus with pressure callus ([place]) 03/20/2021  . Diabetes mellitus type 2, diet-controlled ([place]) 03/20/2021  . Onychomycosis of multiple toenails with type 2 diabetes mellitus ([place]) 03/20/2021  . Chronic idiopathic constipation 03/20/2021  . Lumbar foraminal stenosis  03/08/2021  . Myalgia due to statin 05/26/2020  . History of lumbar fusion 11/22/2019  . GERD (gastroesophageal reflux disease) 04/28/2018  . Pure hypercholesterolemia 04/28/2018  . Diverticulosis 04/28/2018  . RLS (restless legs syndrome) 04/28/2018  . Angina pectoris ([place]) 04/28/2018  . Vitamin D deficiency 12/27/2017  . Chronic fatigue, unspecified 12/27/2017  . Intractable migraine without aura and without status migrainosus 10/17/2015  . DDD (degenerative disc disease), cervical 05/23/2015  . Bilateral occipital neuralgia 05/23/2015  .  DDD (degenerative disc disease), lumbar 05/23/2015  . Multiple sclerosis ([place]) 05/23/2015  . Cervical disc disorder with radiculopathy of cervical region 04/20/2015  . Right arm numbness 04/20/2015  . Right arm weakness 04/20/2015    Jaynie Crumble, SPT  03/30/2021, 8:40 AM  [facility] [address], [place], [address] Phone: [phone]   Fax:  [phone]  Name: Ashlee Richardson MRN: [id number] Date of Birth: 06/24/1956

## 2021-04-03 ENCOUNTER — Ambulatory Visit: Payer: Medicare Other

## 2021-04-04 ENCOUNTER — Other Ambulatory Visit: Payer: Self-pay

## 2021-04-04 ENCOUNTER — Ambulatory Visit: Payer: Medicare Other

## 2021-04-04 DIAGNOSIS — G8929 Other chronic pain: Secondary | ICD-10-CM | POA: Diagnosis not present

## 2021-04-04 DIAGNOSIS — M6281 Muscle weakness (generalized): Secondary | ICD-10-CM

## 2021-04-04 DIAGNOSIS — M62830 Muscle spasm of back: Secondary | ICD-10-CM

## 2021-04-04 DIAGNOSIS — M5441 Lumbago with sciatica, right side: Secondary | ICD-10-CM | POA: Diagnosis not present

## 2021-04-04 DIAGNOSIS — R293 Abnormal posture: Secondary | ICD-10-CM | POA: Diagnosis not present

## 2021-04-04 DIAGNOSIS — M5442 Lumbago with sciatica, left side: Secondary | ICD-10-CM | POA: Diagnosis not present

## 2021-04-04 NOTE — Therapy (Signed)
[facility] [address], [place], [address] Phone: [phone]   Fax:  [phone]  Physical Therapy Treatment  Patient Details  Name: Ashlee Richardson MRN: [id number] Date of Birth: [birth year] Referring Provider (PT): Lorin Mercy MD   Encounter Date: 04/04/2021   PT End of Session - 04/04/21 0820    Visit Number 6    Number of Visits 17    Date for PT Re-Evaluation 05/02/21    Authorization Type UHC Medicare    Authorization Time Period 03/14/21-06/06/21    PT Start Time 0731    PT Stop Time 0814    PT Time Calculation (min) 43 min    Activity Tolerance Patient tolerated treatment well;Patient limited by pain    Behavior During Therapy [facility] for tasks assessed/performed           Past Medical History:  Diagnosis Date  . B12 deficiency 12/27/2017  . Diverticulitis   . Fibromyalgia   . GERD (gastroesophageal reflux disease)   . High cholesterol   . Migraine   . MS (multiple sclerosis) ([place])   . PUD (peptic ulcer disease) 04/28/2018    Past Surgical History:  Procedure Laterality Date  . BACK SURGERY  1991?   Pt usure of date  . BREAST BIOPSY Right   . COLONOSCOPY  09/2007  . COLONOSCOPY WITH PROPOFOL N/A 08/28/2017   Procedure: COLONOSCOPY WITH PROPOFOL;  Surgeon: Christene Lye, MD;  Location: [facility] ENDOSCOPY;  Service: Endoscopy;  Laterality: N/A;  . ESOPHAGOGASTRODUODENOSCOPY (EGD) WITH PROPOFOL N/A 11/24/2018   Procedure: ESOPHAGOGASTRODUODENOSCOPY (EGD) WITH PROPOFOL;  Surgeon: Lin Landsman, MD;  Location: [facility] ENDOSCOPY;  Service: Gastroenterology;  Laterality: N/A;  . Foot sugery Right    3rd toe from the right"    There were no vitals filed for this visit.   Subjective Assessment - 04/04/21 0728    Subjective Patient reports that she feels okay today.Patient states that she has a CT scan scheduled on Thursday.    Pertinent History Ashlee Richardson is a 38yoF who comes to OPPT for  ongoing chronic low back pain with bilateral sciatica. Patient also reports L sided neck pain that radiates to L arm. Onset ~30ya, recent exacerbation December 2021 which has interfered with sleep. 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 Patient would like to work out for fitness to decrease pain with concerns of disc deteriorating overtime. Patient says that physical therapy must happen prior to eligibility for low back surgery.    Limitations Sitting    How long can you sit comfortably? 5 minutes    How long can you walk comfortably? 2 miles    Patient Stated Goals To return to physical activity and gym exercises    Currently in Pain? No/denies            TREATMENT Therapeutic Exercise   Hook lying posterior pelvic tilt 2 x 10  Glute bridges 2 x 10  - Patient tolerates <30% elevation, significant limitation from pain  Hooklying Lower Trunk Rotations 2  x 10 w/ 3 second holds, bilat  Knee to Chest in sitting x 10 w/ 3 second holds - No assistance required for transition from supine to sitting, but pt is severely limiting, ultimately requires >2 minutes   Seated physioball roll-outs 20x5secH Seated physioball roll-outs  c alternating side lateral deviation 20x5secH (10 each direction)  Rotary hip machine abduction 25# 2 x 10  Rotary hip machine extension 25# x  10  Treadmill 2.0 speed x 10 mins - Frequent BUE support of bars needed  Knee extension machine 35# 2 x 10      Performed to address pain and spasms.      PT Education - 04/04/21 0818    Education Details form/technique with exercise    Person(s) Educated Patient    Methods Explanation;Demonstration    Comprehension Verbalized understanding;Returned demonstration            PT Short Term Goals - 03/14/21 1643      PT SHORT TERM GOAL #1   Title Patient will improve FOTO score from 57 to 60 to indicate improvement with ambulation.    Time 2    Period Weeks    Status New    Target Date  03/28/21             PT Long Term Goals - 03/14/21 1650      PT LONG TERM GOAL #1   Title Patient will be demonstrate a decrease in worse pain from 10/10 to 7/10 to be able sit for a prolonged period of time.    Time 8    Period Weeks    Status New      PT LONG TERM GOAL #2   Title Patient will improve her lumbar range of motion to 50% of normal limits to be able to return to exercise at the gym    Time 8    Period Weeks    Status New    Target Date 05/09/21      PT LONG TERM GOAL #3   Title Patient will be able to comfortably sit for 45 minutes to be able to perform occupational tasks.    Time 8    Period Weeks    Status New    Target Date 05/09/21                 Plan - 04/04/21 0825    Clinical Impression Statement Patient continues to show improvements with the ability to perform and tolerate exercises such as posterior pelvic tilts and LTRs with minimal pain. However, patient still has difficulty performing rotary machine hip extensions without aggravating the back pain. Patient required verbal cueing while performing glute bridges to limit execursion to tolerate range. Patient remains limited doing glute bridges because of pain. Patient will further benefit from skilled therapy to return to prior level of function.    Personal Factors and Comorbidities Age;Comorbidity 3+;Profession    Comorbidities MS, prior surgery, chronic low back pain, chronic neck pain, fibromyalgia    Examination-Activity Limitations Bed Mobility;Squat;Lift;Stairs;Bend;Sit;Sleep    Examination-Participation Restrictions Occupation;Cleaning;Personal Finances;Yard Work    [employer]chant [employer] officer Evolving/Moderate complexity    Rehab Potential Fair    PT Frequency 2x / week    PT Duration 8 weeks    PT Treatment/Interventions Therapeutic exercise;Therapeutic activities;Patient/family education;Passive range of motion;Manual techniques;Dry needling    PT Next Visit Plan Work on  stregntening paraspinal muscles and glutes and increasing lumbar extension    PT Home Exercise Plan standing lumbar extension, sit to stand, pelvic tilt    Consulted and Agree with Plan of Care Patient           Patient will benefit from skilled therapeutic intervention in order to improve the following deficits and impairments:  Pain,Hypomobility,Decreased strength,Decreased range of motion,Decreased endurance,Decreased activity tolerance,Increased edema  Visit Diagnosis: Muscle spasm of back  Muscle weakness (generalized)  Chronic bilateral low back pain with bilateral sciatica  Abnormal posture     Problem List Patient Active Problem List   Diagnosis Date Noted  . Type 2 diabetes mellitus with pressure callus ([place]) 03/20/2021  . Diabetes mellitus type 2, diet-controlled ([place]) 03/20/2021  . Onychomycosis of multiple toenails with type 2 diabetes mellitus ([place]) 03/20/2021  . Chronic idiopathic constipation 03/20/2021  . Lumbar foraminal stenosis 03/08/2021  . Myalgia due to statin 05/26/2020  . History of lumbar fusion 11/22/2019  . GERD (gastroesophageal reflux disease) 04/28/2018  . Pure hypercholesterolemia 04/28/2018  . Diverticulosis 04/28/2018  . RLS (restless legs syndrome) 04/28/2018  . Angina pectoris ([place]) 04/28/2018  . Vitamin D deficiency 12/27/2017  . Chronic fatigue, unspecified 12/27/2017  . Intractable migraine without aura and without status migrainosus 10/17/2015  . DDD (degenerative disc disease), cervical 05/23/2015  . Bilateral occipital neuralgia 05/23/2015  . DDD (degenerative disc disease), lumbar 05/23/2015  . Multiple sclerosis ([place]) 05/23/2015  . Cervical disc disorder with radiculopathy of cervical region 04/20/2015  . Right arm numbness 04/20/2015  . Right arm weakness 04/20/2015    Jaynie Crumble, SPT 04/04/2021, 11:39 AM  [facility] [address], [place],  [address] Phone: [phone]   Fax:  [phone]  Name: Ashlee Richardson MRN: [id number] Date of Birth: [birth year]

## 2021-04-04 NOTE — Therapy (Deleted)
[facility] [address], [place], [address] Phone: [phone]   Fax:  [phone]  Physical Therapy Treatment  Patient Details  Name: Ashlee Richardson MRN: [id number] Date of Birth: 09-Aug-1956 Referring Provider (PT): Lorin Mercy MD   Encounter Date: 04/04/2021   PT End of Session - 04/04/21 0820    Visit Number 6    Number of Visits 17    Date for PT Re-Evaluation 05/02/21    PT Start Time 0731    PT Stop Time 0814    PT Time Calculation (min) 43 min    Activity Tolerance Patient tolerated treatment well;Patient limited by pain    Behavior During Therapy [facility] for tasks assessed/performed           Past Medical History:  Diagnosis Date  . B12 deficiency 12/27/2017  . Diverticulitis   . Fibromyalgia   . GERD (gastroesophageal reflux disease)   . High cholesterol   . Migraine   . MS (multiple sclerosis) ([place])   . PUD (peptic ulcer disease) 04/28/2018    Past Surgical History:  Procedure Laterality Date  . BACK SURGERY  1991?   Pt usure of date  . BREAST BIOPSY Right   . COLONOSCOPY  09/2007  . COLONOSCOPY WITH PROPOFOL N/A 08/28/2017   Procedure: COLONOSCOPY WITH PROPOFOL;  Surgeon: Christene Lye, MD;  Location: [facility] ENDOSCOPY;  Service: Endoscopy;  Laterality: N/A;  . ESOPHAGOGASTRODUODENOSCOPY (EGD) WITH PROPOFOL N/A 11/24/2018   Procedure: ESOPHAGOGASTRODUODENOSCOPY (EGD) WITH PROPOFOL;  Surgeon: Lin Landsman, MD;  Location: [facility] ENDOSCOPY;  Service: Gastroenterology;  Laterality: N/A;  . Foot sugery Right    3rd toe from the right"    There were no vitals filed for this visit.   Subjective Assessment - 04/04/21 0728    Subjective Patient reports that she feels okay today.Patient states that she has a CT scan scheduled on Thursday.    Pertinent History Ashlee Richardson is a 53yoF who comes to OPPT for ongoing chronic low back pain with bilateral sciatica. Patient also reports L  sided neck pain that radiates to L arm. Onset ~30ya, recent exacerbation December 2021 which has interfered with sleep. 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 Patient would like to work out for fitness to decrease pain with concerns of disc deteriorating overtime. Patient says that physical therapy must happen prior to eligibility for low back surgery.    Limitations Sitting    How long can you sit comfortably? 5 minutes    How long can you walk comfortably? 2 miles    Patient Stated Goals To return to physical activity and gym exercises    Currently in Pain? No/denies           TREATMENT Therapeutic Exercise   Hook lying pelvic tilt 2 x 10   Glute bridges 2 x 10  Lower Trunk Rotations 2  x 10 Knee to Chest x 10  Prayer stretch w/ physioball x 20  Prayer stretch w/ rotation with physioball x 20  Rotary hip machine adduction 25# 2 x 10  Rotary hip machine extension 25# x 10  Treadmill 2.0 speed x 10 mins Leg extension machine 35# 2 x 10      Performed to address pain and spasms.        PT Education - 04/04/21 0818    Education Details form/technique with exercise    Person(s) Educated Patient    Methods Explanation;Demonstration  Comprehension Verbalized understanding;Returned demonstration            PT Short Term Goals - 03/14/21 1643      PT SHORT TERM GOAL #1   Title Patient will improve FOTO score from 57 to 60 to indicate improvement with ambulation.    Time 2    Period Weeks    Status New    Target Date 03/28/21             PT Long Term Goals - 03/14/21 1650      PT LONG TERM GOAL #1   Title Patient will be demonstrate a decrease in worse pain from 10/10 to 7/10 to be able sit for a prolonged period of time.    Time 8    Period Weeks    Status New      PT LONG TERM GOAL #2   Title Patient will improve her lumbar range of motion to 50% of normal limits to be able to return to exercise at the gym    Time 8    Period  Weeks    Status New    Target Date 05/09/21      PT LONG TERM GOAL #3   Title Patient will be able to comfortably sit for 45 minutes to be able to perform occupational tasks.    Time 8    Period Weeks    Status New    Target Date 05/09/21                 Plan - 04/04/21 0825    Clinical Impression Statement Patient continues to show improvements with the ability to perform and tolerate exercises such as posterior pelvic tilts and LTRs with minimal pain. However, patient still has difficulty performing rotary machine hip extensions without aggravating the back pain. Patient will further benefit from skilled therapy to return to prior level of function.    Personal Factors and Comorbidities Age;Comorbidity 3+;Profession    Comorbidities MS, prior surgery, chronic low back pain, chronic neck pain, fibromyalgia    Examination-Activity Limitations Bed Mobility;Squat;Lift;Stairs;Bend;Sit;Sleep    Examination-Participation Restrictions Occupation;Cleaning;Personal Finances;Yard Work    [employer]chant [employer] officer Evolving/Moderate complexity    Rehab Potential Fair    PT Frequency 2x / week    PT Duration 8 weeks    PT Treatment/Interventions Therapeutic exercise;Therapeutic activities;Patient/family education;Passive range of motion;Manual techniques;Dry needling    PT Next Visit Plan Work on stregntening paraspinal muscles and glutes and increasing lumbar extension    PT Home Exercise Plan standing lumbar extension, sit to stand, pelvic tilt    Consulted and Agree with Plan of Care Patient           Patient will benefit from skilled therapeutic intervention in order to improve the following deficits and impairments:  Pain,Hypomobility,Decreased strength,Decreased range of motion,Decreased endurance,Decreased activity tolerance,Increased edema  Visit Diagnosis: Muscle spasm of back  Muscle weakness (generalized)  Chronic bilateral low back pain with bilateral  sciatica  Abnormal posture     Problem List Patient Active Problem List   Diagnosis Date Noted  . Type 2 diabetes mellitus with pressure callus ([place]) 03/20/2021  . Diabetes mellitus type 2, diet-controlled ([place]) 03/20/2021  . Onychomycosis of multiple toenails with type 2 diabetes mellitus ([place]) 03/20/2021  . Chronic idiopathic constipation 03/20/2021  . Lumbar foraminal stenosis 03/08/2021  . Myalgia due to statin 05/26/2020  . History of lumbar fusion 11/22/2019  . GERD (gastroesophageal reflux disease) 04/28/2018  . Pure hypercholesterolemia 04/28/2018  .  Diverticulosis 04/28/2018  . RLS (restless legs syndrome) 04/28/2018  . Angina pectoris ([place]) 04/28/2018  . Vitamin D deficiency 12/27/2017  . Chronic fatigue, unspecified 12/27/2017  . Intractable migraine without aura and without status migrainosus 10/17/2015  . DDD (degenerative disc disease), cervical 05/23/2015  . Bilateral occipital neuralgia 05/23/2015  . DDD (degenerative disc disease), lumbar 05/23/2015  . Multiple sclerosis ([place]) 05/23/2015  . Cervical disc disorder with radiculopathy of cervical region 04/20/2015  . Right arm numbness 04/20/2015  . Right arm weakness 04/20/2015    Jaynie Crumble, SPT 04/04/2021, 8:38 AM  [facility] [address], [place], [address] Phone: [phone]   Fax:  [phone]  Name: Ashlee Richardson MRN: [id number] Date of Birth: [birth year]

## 2021-04-06 ENCOUNTER — Ambulatory Visit
Admission: RE | Admit: 2021-04-06 | Discharge: 2021-04-06 | Disposition: A | Discharge status: Home / Self Care | Payer: Medicare Other | Source: Ambulatory Visit | Attending: Family Medicine | Admitting: Family Medicine

## 2021-04-06 DIAGNOSIS — K5750 Diverticulosis of both small and large intestine without perforation or abscess without bleeding: Secondary | ICD-10-CM | POA: Diagnosis not present

## 2021-04-06 DIAGNOSIS — K753 Granulomatous hepatitis, not elsewhere classified: Secondary | ICD-10-CM | POA: Diagnosis not present

## 2021-04-06 DIAGNOSIS — D1803 Hemangioma of intra-abdominal structures: Secondary | ICD-10-CM | POA: Diagnosis not present

## 2021-04-06 DIAGNOSIS — R1032 Left lower quadrant pain: Secondary | ICD-10-CM | POA: Diagnosis not present

## 2021-04-06 DIAGNOSIS — I700 Atherosclerosis of aorta: Secondary | ICD-10-CM | POA: Diagnosis not present

## 2021-04-06 LAB — POCT I-STAT CREATININE: Creatinine, Ser: 0.7 mg/dL (ref 0.44–1.00)

## 2021-04-06 MED ORDER — IOHEXOL 300 MG/ML  SOLN
100.0000 mL | Freq: Once | INTRAMUSCULAR | Status: AC | PRN
  Administered 2021-04-06: 100 mL via INTRAVENOUS

## 2021-04-11 ENCOUNTER — Ambulatory Visit: Payer: Medicare Other

## 2021-04-11 ENCOUNTER — Other Ambulatory Visit: Payer: Self-pay

## 2021-04-11 DIAGNOSIS — M6281 Muscle weakness (generalized): Secondary | ICD-10-CM | POA: Diagnosis not present

## 2021-04-11 DIAGNOSIS — M62830 Muscle spasm of back: Secondary | ICD-10-CM

## 2021-04-11 DIAGNOSIS — G8929 Other chronic pain: Secondary | ICD-10-CM

## 2021-04-11 DIAGNOSIS — M5442 Lumbago with sciatica, left side: Secondary | ICD-10-CM | POA: Diagnosis not present

## 2021-04-11 DIAGNOSIS — M5441 Lumbago with sciatica, right side: Secondary | ICD-10-CM | POA: Diagnosis not present

## 2021-04-11 DIAGNOSIS — R293 Abnormal posture: Secondary | ICD-10-CM

## 2021-04-11 NOTE — Therapy (Signed)
[place] [address] [place], [address] Phone: [phone]   Fax:  [phone]  Physical Therapy Treatment  Patient Details  Name: Ashlee Richardson MRN: [id number] Date of Birth: 10/31/1956 Referring Provider (PT): Lorin Mercy MD   Encounter Date: 04/11/2021   PT End of Session - 04/11/21 0846    Visit Number 7    Number of Visits 17    Date for PT Re-Evaluation 05/02/21    Authorization Type UHC Medicare    Authorization Time Period 03/14/21-06/06/21    PT Start Time 0746    PT Stop Time 0828    PT Time Calculation (min) 42 min    Activity Tolerance Patient tolerated treatment well;Patient limited by pain    Behavior During Therapy [facility] for tasks assessed/performed           Past Medical History:  Diagnosis Date  . B12 deficiency 12/27/2017  . Diverticulitis   . Fibromyalgia   . GERD (gastroesophageal reflux disease)   . High cholesterol   . Migraine   . MS (multiple sclerosis) ([place])   . PUD (peptic ulcer disease) 04/28/2018    Past Surgical History:  Procedure Laterality Date  . BACK SURGERY  1991?   Pt usure of date  . BREAST BIOPSY Right   . COLONOSCOPY  09/2007  . COLONOSCOPY WITH PROPOFOL N/A 08/28/2017   Procedure: COLONOSCOPY WITH PROPOFOL;  Surgeon: Christene Lye, MD;  Location: [facility] ENDOSCOPY;  Service: Endoscopy;  Laterality: N/A;  . ESOPHAGOGASTRODUODENOSCOPY (EGD) WITH PROPOFOL N/A 11/24/2018   Procedure: ESOPHAGOGASTRODUODENOSCOPY (EGD) WITH PROPOFOL;  Surgeon: Lin Landsman, MD;  Location: [facility] ENDOSCOPY;  Service: Gastroenterology;  Laterality: N/A;  . Foot sugery Right    3rd toe from the right"    There were no vitals filed for this visit.   Subjective Assessment - 04/11/21 0833    Subjective Patient states that she had a hard time getting up this morning limited by pain. Patient reports a decrease in L sided low back pain throghout the session with increased  activity.    Pertinent History Azariya Richardson is a 53yoF who comes to OPPT for ongoing chronic low back pain with bilateral sciatica. Patient also reports L sided neck pain that radiates to L arm. Onset ~30ya, recent exacerbation December 2021 which has interfered with sleep. 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 Patient would like to work out for fitness to decrease pain with concerns of disc deteriorating overtime. Patient says that physical therapy must happen prior to eligibility for low back surgery.    Limitations Sitting    How long can you sit comfortably? 5 minutes    How long can you walk comfortably? 2 miles    Patient Stated Goals To return to physical activity and gym exercises    Currently in Pain? Yes    Pain Score 8     Pain Location Back    Pain Orientation Left    Pain Descriptors / Indicators Aching    Pain Type Chronic pain    Pain Onset Other (comment)   3 months ago   Pain Onset More than a month ago    Pain Onset More than a month ago            TREATMENT Therapeutic Exercise   Hook lying posterior pelvic tilt 2 x 10  Glute bridges x 10 in hooklying, caused increased low back pain Glute bridges x 10 with  bolster under B popliteal fossa, required verbal cueing to reduce lumbar lordosis and improved glut activation. Reports of decreased symptom reproduction in low back and improved ability to elevated gluts off table (from 30% clearance to 50%). Hooklying Lower Trunk Rotations 2  x 10 w/ 3 second holds, bilat. L LBP wit R LTR.  Seated physioball roll-outs 20x5secH Seated physioball roll-outs  c alternating side lateral deviation 20x5secH (10 each direction)  B Rotary hip machine abduction 25# 2 x 10  L Rotary hip machine extension 25# x 10  R Rotary hip machine extension 25# 2 x 10  Treadmill 2.5 speed x 10 mins  Frequent BUE support of bars needed  Knee extension machine 35# 2 x 10        Performed to address pain and spasms.     PT Education - 04/11/21 0846    Education Details form/technique with exercise    Person(s) Educated Patient    Methods Explanation;Demonstration    Comprehension Verbalized understanding;Returned demonstration            PT Short Term Goals - 03/14/21 1643      PT SHORT TERM GOAL #1   Title Patient will improve FOTO score from 57 to 60 to indicate improvement with ambulation.    Time 2    Period Weeks    Status New    Target Date 03/28/21             PT Long Term Goals - 03/14/21 1650      PT LONG TERM GOAL #1   Title Patient will be demonstrate a decrease in worse pain from 10/10 to 7/10 to be able sit for a prolonged period of time.    Time 8    Period Weeks    Status New      PT LONG TERM GOAL #2   Title Patient will improve her lumbar range of motion to 50% of normal limits to be able to return to exercise at the gym    Time 8    Period Weeks    Status New    Target Date 05/09/21      PT LONG TERM GOAL #3   Title Patient will be able to comfortably sit for 45 minutes to be able to perform occupational tasks.    Time 8    Period Weeks    Status New    Target Date 05/09/21                 Plan - 04/11/21 0847    Clinical Impression Statement Patient continues to demonstrate improvements with the ability to perform and tolerate exercises such as lumbar flexion roll outs with physioball. However, patient still has difficulty performing glute bridges and L hip extension machine exercises limited by increased lumbar spasm type pain during activity. Able to improve pain with bridges by briding up with LE's on bolster and cuing to reduce low back lordosis and increased glut activation. Patient will further benefit from skilled therpay to return to prior level of function.    Personal Factors and Comorbidities Age;Comorbidity 3+;Profession    Comorbidities MS, prior surgery, chronic low back pain, chronic neck pain, fibromyalgia    Examination-Activity  Limitations Bed Mobility;Squat;Lift;Stairs;Bend;Sit;Sleep    Examination-Participation Restrictions Occupation;Cleaning;Personal Finances;Yard Work    [employer]chant [employer] officer Evolving/Moderate complexity    Rehab Potential Fair    PT Frequency 2x / week    PT Duration 8 weeks    PT Treatment/Interventions Therapeutic exercise;Therapeutic  activities;Patient/family education;Passive range of motion;Manual techniques;Dry needling    PT Next Visit Plan Work on stregntening paraspinal muscles and glutes and increasing lumbar extension; thoracic mobility.    PT Home Exercise Plan standing lumbar extension, sit to stand, pelvic tilt    Consulted and Agree with Plan of Care Patient           Patient will benefit from skilled therapeutic intervention in order to improve the following deficits and impairments:  Pain,Hypomobility,Decreased strength,Decreased range of motion,Decreased endurance,Decreased activity tolerance,Increased edema  Visit Diagnosis: Muscle weakness (generalized)  Muscle spasm of back  Abnormal posture  Chronic bilateral low back pain with bilateral sciatica     Problem List Patient Active Problem List   Diagnosis Date Noted  . Type 2 diabetes mellitus with pressure callus ([place]) 03/20/2021  . Diabetes mellitus type 2, diet-controlled ([place]) 03/20/2021  . Onychomycosis of multiple toenails with type 2 diabetes mellitus ([place]) 03/20/2021  . Chronic idiopathic constipation 03/20/2021  . Lumbar foraminal stenosis 03/08/2021  . Myalgia due to statin 05/26/2020  . History of lumbar fusion 11/22/2019  . GERD (gastroesophageal reflux disease) 04/28/2018  . Pure hypercholesterolemia 04/28/2018  . Diverticulosis 04/28/2018  . RLS (restless legs syndrome) 04/28/2018  . Angina pectoris ([place]) 04/28/2018  . Vitamin D deficiency 12/27/2017  . Chronic fatigue, unspecified 12/27/2017  . Intractable migraine without aura and without status migrainosus 10/17/2015  .  DDD (degenerative disc disease), cervical 05/23/2015  . Bilateral occipital neuralgia 05/23/2015  . DDD (degenerative disc disease), lumbar 05/23/2015  . Multiple sclerosis ([place]) 05/23/2015  . Cervical disc disorder with radiculopathy of cervical region 04/20/2015  . Right arm numbness 04/20/2015  . Right arm weakness 04/20/2015    Salem Caster. Fairly IV, PT, DPT Physical Therapist- [facility]  04/11/2021, 9:23 AM  [facility] [facility] [address] [place], [address] Phone: [phone]   Fax:  [phone]  Name: Ashlee Richardson MRN: [id number] Date of Birth: 1956/04/09

## 2021-04-12 ENCOUNTER — Ambulatory Visit (INDEPENDENT_AMBULATORY_CARE_PROVIDER_SITE_OTHER): Payer: Medicare Other | Admitting: Orthopaedic Surgery

## 2021-04-12 ENCOUNTER — Encounter: Payer: Self-pay | Admitting: Orthopaedic Surgery

## 2021-04-12 VITALS — BP 148/82 | HR 59 | Ht 68.0 in | Wt 188.0 lb

## 2021-04-12 DIAGNOSIS — M48061 Spinal stenosis, lumbar region without neurogenic claudication: Secondary | ICD-10-CM

## 2021-04-12 DIAGNOSIS — M5136 Other intervertebral disc degeneration, lumbar region: Secondary | ICD-10-CM | POA: Diagnosis not present

## 2021-04-12 NOTE — Progress Notes (Signed)
Office Visit Note   Patient: Ashlee Richardson           Date of Birth: Jul 24, 1956           MRN: [id number] Visit Date: 04/12/2021              Requested by: Steele Sizer, [address] [place],   [id number] PCP: Steele Sizer, MD   Assessment & Plan: Visit Diagnoses:  1. Lumbar foraminal stenosis   2. DDD (degenerative disc disease), lumbar     Plan: I discussed with patient work-up would require right lumbar myelogram CT scan to evaluate her for stenosis above her solid L4-S1 fusion.  By CT scan of the abdomen she certainly has severe foraminal stenosis both right and left at the L3-4 level.  She will call back once her abdominal problems and diarrhea are better.  We will make an appointment in 1 month for recheck.  Follow-Up Instructions: Return in about 1 month (around 05/12/2021).   Orders:  No orders of the defined types were placed in this encounter.  No orders of the defined types were placed in this encounter.     Procedures: No procedures performed   Clinical Data: No additional findings.   Subjective: Chief Complaint  Patient presents with  . Lower Back - Pain    HPI 65 year old female returns post old fusion L4-S1 by Dr. Raquel Sarna in [place].  She has significant spondylosis with severe foraminal stenosis above her fusion.  Recently she has had problems with diverticulitis and has been to the emergency room twice.  We set up therapy for her back claudication symptoms which have not been effective.  CT scan showed some diverticulosis without evidence of diverticulitis.  She has had left flank pain that radiates around to the left abdomen.  Left renal images were normal on CT 04/07/2021.  Patient had persistent problems with diarrhea and abdominal pain.  Review of Systems updated unchanged from 11/17/2019.   Objective: Vital Signs: BP (!) 148/82   Pulse (!) 59   Ht 5\' 8"  (1.727 m)   Wt 188 lb (85.3 kg)   BMI 28.59 kg/m   Physical  Exam Constitutional:      Appearance: She is well-developed.  HENT:     Head: Normocephalic.     Right Ear: External ear normal.     Left Ear: External ear normal.  Eyes:     Pupils: Pupils are equal, round, and reactive to light.  Neck:     Thyroid: No thyromegaly.     Trachea: No tracheal deviation.  Cardiovascular:     Rate and Rhythm: Normal rate.  Pulmonary:     Effort: Pulmonary effort is normal.  Abdominal:     Palpations: Abdomen is soft.  Skin:    General: Skin is warm and dry.  Neurological:     Mental Status: She is alert and oriented to person, place, and time.  Psychiatric:        Behavior: Behavior normal.     Ortho Exam patient is a slow getting from sitting to standing difficulty walking.  Well-healed lumbar incision.  Specialty Comments:  No specialty comments available.  Imaging: No results found.   PMFS History: Patient Active Problem List   Diagnosis Date Noted  . Type 2 diabetes mellitus with pressure callus ([place]) 03/20/2021  . Diabetes mellitus type 2, diet-controlled ([place]) 03/20/2021  . Onychomycosis of multiple toenails with type 2 diabetes mellitus ([place]) 03/20/2021  . Chronic  idiopathic constipation 03/20/2021  . Lumbar foraminal stenosis 03/08/2021  . Myalgia due to statin 05/26/2020  . History of lumbar fusion 11/22/2019  . GERD (gastroesophageal reflux disease) 04/28/2018  . Pure hypercholesterolemia 04/28/2018  . Diverticulosis 04/28/2018  . RLS (restless legs syndrome) 04/28/2018  . Angina pectoris ([place]) 04/28/2018  . Vitamin D deficiency 12/27/2017  . Chronic fatigue, unspecified 12/27/2017  . Intractable migraine without aura and without status migrainosus 10/17/2015  . DDD (degenerative disc disease), cervical 05/23/2015  . Bilateral occipital neuralgia 05/23/2015  . DDD (degenerative disc disease), lumbar 05/23/2015  . Multiple sclerosis ([place]) 05/23/2015  . Cervical disc disorder with radiculopathy of cervical region  04/20/2015  . Right arm numbness 04/20/2015  . Right arm weakness 04/20/2015   Past Medical History:  Diagnosis Date  . B12 deficiency 12/27/2017  . Diverticulitis   . Fibromyalgia   . GERD (gastroesophageal reflux disease)   . High cholesterol   . Migraine   . MS (multiple sclerosis) ([place])   . PUD (peptic ulcer disease) 04/28/2018    Family History  Problem Relation Age of Onset  . Heart disease Mother   . Diabetes Mother   . Kidney disease Mother   . Hypertension Mother   . Heart disease Father   . Hyperlipidemia Father   . Hypertension Father   . Heart disease Other   . Cancer Other        Breast  . Diabetes Other   . Hyperlipidemia Sister   . Hypertension Sister   . Kidney disease Sister   . Diabetes Sister   . Lung cancer Paternal Aunt   . Cancer Maternal Grandmother        breast    Past Surgical History:  Procedure Laterality Date  . BACK SURGERY  1991?   Pt usure of date  . BREAST BIOPSY Right   . COLONOSCOPY  09/2007  . COLONOSCOPY WITH PROPOFOL N/A 08/28/2017   Procedure: COLONOSCOPY WITH PROPOFOL;  Surgeon: Christene Lye, MD;  Location: [facility] ENDOSCOPY;  Service: Endoscopy;  Laterality: N/A;  . ESOPHAGOGASTRODUODENOSCOPY (EGD) WITH PROPOFOL N/A 11/24/2018   Procedure: ESOPHAGOGASTRODUODENOSCOPY (EGD) WITH PROPOFOL;  Surgeon: Lin Landsman, MD;  Location: [facility] ENDOSCOPY;  Service: Gastroenterology;  Laterality: N/A;  . Foot sugery Right    3rd toe from the right"   Social History   Occupational History  . 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 Smoking status: Never Smoker  . Smokeless tobacco: Never Used  . Tobacco comment: smoking cessation materials not required  Vaping Use  . Vaping Use: Never used  Substance and Sexual Activity  . Alcohol use: No    Alcohol/week: 0.0 standard drinks  . Drug use: No  . Sexual activity: Yes    Birth control/protection:  Post-menopausal

## 2021-04-13 ENCOUNTER — Ambulatory Visit: Payer: Medicare Other

## 2021-04-13 ENCOUNTER — Other Ambulatory Visit: Payer: Self-pay

## 2021-04-13 DIAGNOSIS — M6281 Muscle weakness (generalized): Secondary | ICD-10-CM | POA: Diagnosis not present

## 2021-04-13 DIAGNOSIS — R293 Abnormal posture: Secondary | ICD-10-CM

## 2021-04-13 DIAGNOSIS — G8929 Other chronic pain: Secondary | ICD-10-CM

## 2021-04-13 DIAGNOSIS — M62830 Muscle spasm of back: Secondary | ICD-10-CM | POA: Diagnosis not present

## 2021-04-13 DIAGNOSIS — M5442 Lumbago with sciatica, left side: Secondary | ICD-10-CM | POA: Diagnosis not present

## 2021-04-13 DIAGNOSIS — M5441 Lumbago with sciatica, right side: Secondary | ICD-10-CM | POA: Diagnosis not present

## 2021-04-13 NOTE — Therapy (Signed)
[facility] [address], [place], [address] Phone: [phone]   Fax:  [phone]  Physical Therapy Treatment  Patient Details  Name: Ashlee Richardson MRN: [id number] Date of Birth: [birth year] Referring Provider (PT): Lorin Mercy MD   Encounter Date: 04/13/2021   PT End of Session - 04/13/21 0828    Visit Number 8    Number of Visits 17    Date for PT Re-Evaluation 05/02/21    Authorization Type UHC Medicare    Authorization Time Period 03/14/21-06/06/21    PT Start Time 0730    PT Stop Time 0813    PT Time Calculation (min) 43 min    Activity Tolerance Patient tolerated treatment well;Patient limited by pain    Behavior During Therapy [facility] for tasks assessed/performed           Past Medical History:  Diagnosis Date  . B12 deficiency 12/27/2017  . Diverticulitis   . Fibromyalgia   . GERD (gastroesophageal reflux disease)   . High cholesterol   . Migraine   . MS (multiple sclerosis) ([place])   . PUD (peptic ulcer disease) 04/28/2018    Past Surgical History:  Procedure Laterality Date  . BACK SURGERY  1991?   Pt usure of date  . BREAST BIOPSY Right   . COLONOSCOPY  09/2007  . COLONOSCOPY WITH PROPOFOL N/A 08/28/2017   Procedure: COLONOSCOPY WITH PROPOFOL;  Surgeon: Christene Lye, MD;  Location: [facility] ENDOSCOPY;  Service: Endoscopy;  Laterality: N/A;  . ESOPHAGOGASTRODUODENOSCOPY (EGD) WITH PROPOFOL N/A 11/24/2018   Procedure: ESOPHAGOGASTRODUODENOSCOPY (EGD) WITH PROPOFOL;  Surgeon: Lin Landsman, MD;  Location: [facility] ENDOSCOPY;  Service: Gastroenterology;  Laterality: N/A;  . Foot sugery Right    3rd toe from the right"    There were no vitals filed for this visit.   Subjective Assessment - 04/13/21 0818    Subjective Patient reports no significant changes in pain or symptoms.    Pertinent History Ashlee Richardson is a 16yoF who comes to OPPT for ongoing chronic low back pain with  bilateral sciatica. Patient also reports L sided neck pain that radiates to L arm. Onset ~30ya, recent exacerbation December 2021 which has interfered with sleep. 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 Patient would like to work out for fitness to decrease pain with concerns of disc deteriorating overtime. Patient says that physical therapy must happen prior to eligibility for low back surgery.    Limitations Sitting    How long can you sit comfortably? 5 minutes    How long can you walk comfortably? 2 miles    Patient Stated Goals To return to physical activity and gym exercises    Currently in Pain? Yes    Pain Score 6     Pain Location Back    Pain Orientation Left    Pain Onset Other (comment)   3 months ago   Pain Onset More than a month ago    Pain Onset More than a month ago            TREATMENT Therapeutic Exercise   Hook lying posterior pelvic tilt 2 x 10  Glute bridges 2 x 10 with bolster under B popliteal fossa, required verbal cueing to reduce lumbar lordosis and improved glut activation. Reports of decreased symptom reproduction in low back and improved ability to elevated gluts off table (from 30% clearance to 50%). Hooklying Lower Trunk Rotations 2  x 10 w/ 3  second holds, bilat. L LBP wit R LTR.  Seated physioball roll-outs 20x5secH Seated physioball roll-outs  c alternating side lateral deviation 20x5secH (10 each direction)  B Rotary hip machine abduction 25# 2 x 10  L Rotary hip machine extension 25# x 10  R Rotary hip machine extension 25# 2 x 10  Treadmill 2.5 speed x 10 mins  Frequent BUE support of bars needed   Performed to increase efficiency in ambulation and alleviate LBP while increasing mobility  Knee extension machine 35# 2 x 10  Seated thoracic extension with pillow in thoracolumbar region 3 x 10  B Seated thoracic rotations with pillow thoracolumbar region x 10      Performed to address pain and spasms       PT  Education - 04/13/21 0828    Education Details form/technique with exercise    Person(s) Educated Patient    Methods Explanation;Demonstration    Comprehension Verbalized understanding;Returned demonstration            PT Short Term Goals - 03/14/21 1643      PT SHORT TERM GOAL #1   Title Patient will improve FOTO score from 57 to 60 to indicate improvement with ambulation.    Time 2    Period Weeks    Status New    Target Date 03/28/21             PT Long Term Goals - 03/14/21 1650      PT LONG TERM GOAL #1   Title Patient will be demonstrate a decrease in worse pain from 10/10 to 7/10 to be able sit for a prolonged period of time.    Time 8    Period Weeks    Status New      PT LONG TERM GOAL #2   Title Patient will improve her lumbar range of motion to 50% of normal limits to be able to return to exercise at the gym    Time 8    Period Weeks    Status New    Target Date 05/09/21      PT LONG TERM GOAL #3   Title Patient will be able to comfortably sit for 45 minutes to be able to perform occupational tasks.    Time 8    Period Weeks    Status New    Target Date 05/09/21                 Plan - 04/13/21 0829    Clinical Impression Statement Patient continues to show improvement with the ability to perform and tolerate prolonged glute strengthening exercises. Patient had some difficulty while performing seated throacic extension and rotation exercises, with the motion being limited by pain. As pt performed through thoracic mobility, pt does report improvement in her pain. Patient will benefit from skilled therapy to return to prior level of function.    Personal Factors and Comorbidities Age;Comorbidity 3+;Profession    Comorbidities MS, prior surgery, chronic low back pain, chronic neck pain, fibromyalgia    Examination-Activity Limitations Bed Mobility;Squat;Lift;Stairs;Bend;Sit;Sleep    Examination-Participation Restrictions Occupation;Cleaning;Personal  Finances;Yard Work    [employer]chant [employer] officer Evolving/Moderate complexity    Rehab Potential Fair    PT Frequency 2x / week    PT Duration 8 weeks    PT Treatment/Interventions Therapeutic exercise;Therapeutic activities;Patient/family education;Passive range of motion;Manual techniques;Dry needling    PT Next Visit Plan Work on strengthening paraspinal muscles and glutes and increasing lumbar extension; thoracic mobility.  PT Home Exercise Plan standing lumbar extension, sit to stand, pelvic tilt    Consulted and Agree with Plan of Care Patient           Patient will benefit from skilled therapeutic intervention in order to improve the following deficits and impairments:  Pain,Hypomobility,Decreased strength,Decreased range of motion,Decreased endurance,Decreased activity tolerance,Increased edema  Visit Diagnosis: Muscle weakness (generalized)  Muscle spasm of back  Abnormal posture  Chronic bilateral low back pain with bilateral sciatica     Problem List Patient Active Problem List   Diagnosis Date Noted  . Type 2 diabetes mellitus with pressure callus ([place]) 03/20/2021  . Diabetes mellitus type 2, diet-controlled ([place]) 03/20/2021  . Onychomycosis of multiple toenails with type 2 diabetes mellitus ([place]) 03/20/2021  . Chronic idiopathic constipation 03/20/2021  . Lumbar foraminal stenosis 03/08/2021  . Myalgia due to statin 05/26/2020  . History of lumbar fusion 11/22/2019  . GERD (gastroesophageal reflux disease) 04/28/2018  . Pure hypercholesterolemia 04/28/2018  . Diverticulosis 04/28/2018  . RLS (restless legs syndrome) 04/28/2018  . Angina pectoris ([place]) 04/28/2018  . Vitamin D deficiency 12/27/2017  . Chronic fatigue, unspecified 12/27/2017  . Intractable migraine without aura and without status migrainosus 10/17/2015  . DDD (degenerative disc disease), cervical 05/23/2015  . Bilateral occipital neuralgia 05/23/2015  . DDD (degenerative disc  disease), lumbar 05/23/2015  . Multiple sclerosis ([place]) 05/23/2015  . Cervical disc disorder with radiculopathy of cervical region 04/20/2015  . Right arm numbness 04/20/2015  . Right arm weakness 04/20/2015    Salem Caster. Fairly IV, PT, DPT Physical Therapist- [facility]  04/13/2021, 9:43 AM  [place] [facility] [address], [place], [address] Phone: [phone]   Fax:  [phone]  Name: ALIVEA GLADSON MRN: [id number] Date of Birth: [birth year]

## 2021-04-18 ENCOUNTER — Ambulatory Visit: Payer: Medicare Other

## 2021-04-18 ENCOUNTER — Other Ambulatory Visit: Payer: Self-pay

## 2021-04-18 DIAGNOSIS — R293 Abnormal posture: Secondary | ICD-10-CM

## 2021-04-18 DIAGNOSIS — M62830 Muscle spasm of back: Secondary | ICD-10-CM

## 2021-04-18 DIAGNOSIS — M5442 Lumbago with sciatica, left side: Secondary | ICD-10-CM

## 2021-04-18 DIAGNOSIS — G8929 Other chronic pain: Secondary | ICD-10-CM

## 2021-04-18 DIAGNOSIS — M6281 Muscle weakness (generalized): Secondary | ICD-10-CM

## 2021-04-18 DIAGNOSIS — M5441 Lumbago with sciatica, right side: Secondary | ICD-10-CM | POA: Diagnosis not present

## 2021-04-18 NOTE — Therapy (Signed)
[place] [facility] [address] [place], [address] Phone: [phone]   Fax:  [phone]  Physical Therapy Treatment  Patient Details  Name: Ashlee Richardson MRN: [id number] Date of Birth: Feb 03, 1956 Referring Provider (PT): Lorin Mercy MD   Encounter Date: 04/18/2021   PT End of Session - 04/18/21 0830    Visit Number 9    Number of Visits 17    Date for PT Re-Evaluation 05/02/21    Authorization Type UHC Medicare    Authorization Time Period 03/14/21-06/06/21    PT Start Time 0732    PT Stop Time 0817    PT Time Calculation (min) 45 min    Activity Tolerance Patient tolerated treatment well;Patient limited by pain    Behavior During Therapy [facility] for tasks assessed/performed           Past Medical History:  Diagnosis Date  . B12 deficiency 12/27/2017  . Diverticulitis   . Fibromyalgia   . GERD (gastroesophageal reflux disease)   . High cholesterol   . Migraine   . MS (multiple sclerosis) ([place])   . PUD (peptic ulcer disease) 04/28/2018    Past Surgical History:  Procedure Laterality Date  . BACK SURGERY  1991?   Pt usure of date  . BREAST BIOPSY Right   . COLONOSCOPY  09/2007  . COLONOSCOPY WITH PROPOFOL N/A 08/28/2017   Procedure: COLONOSCOPY WITH PROPOFOL;  Surgeon: Christene Lye, MD;  Location: [facility] ENDOSCOPY;  Service: Endoscopy;  Laterality: N/A;  . ESOPHAGOGASTRODUODENOSCOPY (EGD) WITH PROPOFOL N/A 11/24/2018   Procedure: ESOPHAGOGASTRODUODENOSCOPY (EGD) WITH PROPOFOL;  Surgeon: Lin Landsman, MD;  Location[facility]OSCOPY;  Service: Gastroenterology;  Laterality: N/A;  . Foot sugery Right    3rd toe from the right"    There were no vitals filed for this visit.   Subjective Assessment - 04/18/21 0824    Subjective Patient reports that she almost fell at work a few days ago. Patient states that she feels as though her balance has been off since Friday. Patient reports that her back  feels good today.    Pertinent History Ashlee Richardson is a 36yoF who comes to OPPT for ongoing chronic low back pain with bilateral sciatica. Patient also reports L sided neck pain that radiates to L arm. Onset ~30ya, recent exacerbation December 2021 which has interfered with sleep. 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 Patient would like to work out for fitness to decrease pain with concerns of disc deteriorating overtime. Patient says that physical therapy must happen prior to eligibility for low back surgery.    Limitations Sitting    How long can you sit comfortably? 5 minutes    How long can you walk comfortably? 2 miles    Patient Stated Goals To return to physical activity and gym exercises    Currently in Pain? No/denies    Pain Onset Other (comment)   3 months ago   Pain Onset More than a month ago    Pain Onset More than a month ago             TREATMENT Therapeutic Exercise   Hook lying posterior pelvic tilt 2 x 10  Glute bridges 2 x 10  - ~ 30% of motion off the treatment table  Hooklying Lower Trunk Rotations 2  x 10 w/ 3 second holds, bilat. L LBP with R LTR with arms overhead. After VC's to relax arms by side, pt reported  improvements in R LBP.  Seated physioball roll-outs 20x5secH Seated physioball roll-outs  c alternating side lateral deviation 20x5secH (10 each direction)  B Rotary hip machine abduction 25# 2 x 10  B Rotary hip machine extension 25# 2 x 10  Treadmill 2.5 speed x 10 mins  Frequent BUE support of bars needed   Performed to increase efficiency in ambulation and alleviate LBP while increasing mobility  Knee extension machine 35# 2 x 10  Knee to chest in supine (holding  both legs at the same time) 2 x 10  Seated thoracic extension with pillow in thoracolumbar region 3 x 10        Performed to address pain and spasms.   PT Education - 04/18/21 0828    Education Details form/technique with exercise    Person(s)  Educated Patient    Methods Explanation;Demonstration    Comprehension Verbalized understanding;Returned demonstration            PT Short Term Goals - 03/14/21 1643      PT SHORT TERM GOAL #1   Title Patient will improve FOTO score from 57 to 60 to indicate improvement with ambulation.    Time 2    Period Weeks    Status New    Target Date 03/28/21             PT Long Term Goals - 03/14/21 1650      PT LONG TERM GOAL #1   Title Patient will be demonstrate a decrease in worse pain from 10/10 to 7/10 to be able sit for a prolonged period of time.    Time 8    Period Weeks    Status New      PT LONG TERM GOAL #2   Title Patient will improve her lumbar range of motion to 50% of normal limits to be able to return to exercise at the gym    Time 8    Period Weeks    Status New    Target Date 05/09/21      PT LONG TERM GOAL #3   Title Patient will be able to comfortably sit for 45 minutes to be able to perform occupational tasks.    Time 8    Period Weeks    Status New    Target Date 05/09/21                 Plan - 04/18/21 0830    Clinical Impression Statement Patient demonstrates improvements with hip muscle extension/abduction strengthening exercises. Patient's balance was assessed while performing SLS and tandem walking, however patient was able to perform exercises with no increased risk of balance deficits. Patient still has some difficulty with bed mobility transfers from supine to sitting. Patient also has some increased difficulty with performing thoracic extension exercises limited by pain. Patient will further benefit from skilled therapy to return to prior level of function.    Personal Factors and Comorbidities Age;Comorbidity 3+;Profession    Comorbidities MS, prior surgery, chronic low back pain, chronic neck pain, fibromyalgia    Examination-Activity Limitations Bed Mobility;Squat;Lift;Stairs;Bend;Sit;Sleep    Examination-Participation Restrictions  Occupation;Cleaning;Personal Finances;Yard Work;Community Activity    Stability/Clinical Decision Making Evolving/Moderate complexity    Rehab Potential Fair    PT Frequency 2x / week    PT Duration 8 weeks    PT Treatment/Interventions Therapeutic exercise;Therapeutic activities;Patient/family education;Passive range of motion;Manual techniques;Dry needling    PT Next Visit Plan Work on strengthening paraspinal muscles and glutes and increasing lumbar extension;  thoracic mobility.    PT Home Exercise Plan standing lumbar extension, sit to stand, pelvic tilt    Consulted and Agree with Plan of Care Patient           Patient will benefit from skilled therapeutic intervention in order to improve the following deficits and impairments:  Pain,Hypomobility,Decreased strength,Decreased range of motion,Decreased endurance,Decreased activity tolerance,Increased edema  Visit Diagnosis: Muscle weakness (generalized)  Muscle spasm of back  Abnormal posture  Chronic bilateral low back pain with bilateral sciatica     Problem List Patient Active Problem List   Diagnosis Date Noted  . Type 2 diabetes mellitus with pressure callus ([place]) 03/20/2021  . Diabetes mellitus type 2, diet-controlled ([place]) 03/20/2021  . Onychomycosis of multiple toenails with type 2 diabetes mellitus ([place]) 03/20/2021  . Chronic idiopathic constipation 03/20/2021  . Lumbar foraminal stenosis 03/08/2021  . Myalgia due to statin 05/26/2020  . History of lumbar fusion 11/22/2019  . GERD (gastroesophageal reflux disease) 04/28/2018  . Pure hypercholesterolemia 04/28/2018  . Diverticulosis 04/28/2018  . RLS (restless legs syndrome) 04/28/2018  . Angina pectoris ([place]) 04/28/2018  . Vitamin D deficiency 12/27/2017  . Chronic fatigue, unspecified 12/27/2017  . Intractable migraine without aura and without status migrainosus 10/17/2015  . DDD (degenerative disc disease), cervical 05/23/2015  . Bilateral occipital  neuralgia 05/23/2015  . DDD (degenerative disc disease), lumbar 05/23/2015  . Multiple sclerosis ([place]) 05/23/2015  . Cervical disc disorder with radiculopathy of cervical region 04/20/2015  . Right arm numbness 04/20/2015  . Right arm weakness 04/20/2015   Salem Caster. Fairly IV, PT, DPT Physical Therapist- [facility]  04/18/2021, 10:20 AM  [place] [facility] [address] [place], [address] Phone: [phone]   Fax:  [phone]  Name: Ashlee Richardson MRN: [id number] Date of Birth: [birth year]

## 2021-04-20 ENCOUNTER — Telehealth: Payer: Self-pay

## 2021-04-20 ENCOUNTER — Ambulatory Visit: Payer: Medicare Other

## 2021-04-20 DIAGNOSIS — G8929 Other chronic pain: Secondary | ICD-10-CM | POA: Diagnosis not present

## 2021-04-20 DIAGNOSIS — M5442 Lumbago with sciatica, left side: Secondary | ICD-10-CM | POA: Diagnosis not present

## 2021-04-20 DIAGNOSIS — R293 Abnormal posture: Secondary | ICD-10-CM

## 2021-04-20 DIAGNOSIS — M62830 Muscle spasm of back: Secondary | ICD-10-CM

## 2021-04-20 DIAGNOSIS — M6281 Muscle weakness (generalized): Secondary | ICD-10-CM

## 2021-04-20 DIAGNOSIS — M5441 Lumbago with sciatica, right side: Secondary | ICD-10-CM | POA: Diagnosis not present

## 2021-04-20 NOTE — Therapy (Signed)
[facility] [address], [place], [address] Phone: [phone]   Fax:  [phone]  Physical Therapy Treatment/Physical Therapy Progress Note   Dates of reporting period  03/14/2021   to  04/20/2021   Patient Details  Name: Ashlee Richardson MRN: [id number] Date of Birth: 08-11-1956 Referring Provider (PT): Lorin Mercy MD   Encounter Date: 04/20/2021   PT End of Session - 04/20/21 0818    Visit Number 10    Number of Visits 17    Date for PT Re-Evaluation 05/02/21    Authorization Type UHC Medicare    Authorization Time Period 03/14/21-06/06/21    PT Start Time 0726    PT Stop Time 0810    PT Time Calculation (min) 44 min    Activity Tolerance Patient tolerated treatment well;Patient limited by pain    Behavior During Therapy [facility] for tasks assessed/performed           Past Medical History:  Diagnosis Date  . B12 deficiency 12/27/2017  . Diverticulitis   . Fibromyalgia   . GERD (gastroesophageal reflux disease)   . High cholesterol   . Migraine   . MS (multiple sclerosis) ([place])   . PUD (peptic ulcer disease) 04/28/2018    Past Surgical History:  Procedure Laterality Date  . BACK SURGERY  1991?   Pt usure of date  . BREAST BIOPSY Right   . COLONOSCOPY  09/2007  . COLONOSCOPY WITH PROPOFOL N/A 08/28/2017   Procedure: COLONOSCOPY WITH PROPOFOL;  Surgeon: Christene Lye, MD;  Location: [facility] ENDOSCOPY;  Service: Endoscopy;  Laterality: N/A;  . ESOPHAGOGASTRODUODENOSCOPY (EGD) WITH PROPOFOL N/A 11/24/2018   Procedure: ESOPHAGOGASTRODUODENOSCOPY (EGD) WITH PROPOFOL;  Surgeon: Lin Landsman, MD;  Location: [facility] ENDOSCOPY;  Service: Gastroenterology;  Laterality: N/A;  . Foot sugery Right    3rd toe from the right"    There were no vitals filed for this visit.   Subjective Assessment - 04/20/21 0814    Subjective Patient reports feeling good today with no significant changes to symptoms.     Pertinent History Ashlee Richardson is a 39yoF who comes to OPPT for ongoing chronic low back pain with bilateral sciatica. Patient also reports L sided neck pain that radiates to L arm. Onset ~30ya, recent exacerbation December 2021 which has interfered with sleep. 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 Patient would like to work out for fitness to decrease pain with concerns of disc deteriorating overtime. Patient says that physical therapy must happen prior to eligibility for low back surgery.    Limitations Sitting    How long can you sit comfortably? 5 minutes    How long can you walk comfortably? 2 miles    Patient Stated Goals To return to physical activity and gym exercises    Currently in Pain? No/denies    Pain Onset Other (comment)   3 months ago   Pain Onset More than a month ago    Pain Onset More than a month ago           TREATMENT Therapeutic Exercise   Hook lying posterior pelvic tilt 2 x 10  Glute bridges 2 x 10   ~ 30% of motion off the treatment table  Hooklying Lower Trunk Rotations 2  x 10 w/ 3 second holds, bilat. L LBP with R LTR with arms overhead. After VC's to relax arms by side, pt reported improvements in R LBP.  Seated physioball roll-outs  20x5secH Seated physioball roll-outs  c alternating side lateral deviation 20x5secH (10 each direction)  B Rotary hip machine abduction 25# 2 x 10  B Rotary hip machine extension 25# 2 x 10  Treadmill 3.0 MPH speed x 10 mins  Frequent BUE support of bars needed   Performed to increase efficiency in ambulation and alleviate LBP while increasing mobility  Knee to chest in supine (holding both legs at the same time) 2 x 10  - Patient used a strap around knees to perform exercise  Seated thoracic extension with pillow in thoracolumbar region 3 x 10  Hip flexor hooklying with alternating marches 2 x 10     Performed to address pain and spasms   Patient's condition has the potential to improve  in response to therapy. Maximum improvement is yet to be obtained. The anticipated improvement is attainable and reasonable in a generally predictable time.   PT Education - 04/20/21 0817    Education Details form/technique with exercise    Person(s) Educated Patient    Methods Explanation;Demonstration    Comprehension Verbalized understanding;Returned demonstration            PT Short Term Goals - 03/14/21 1643      PT SHORT TERM GOAL #1   Title Patient will improve FOTO score from 57 to 60 to indicate improvement with ambulation.    Time 2    Period Weeks    Status New    Target Date 03/28/21             PT Long Term Goals - 04/20/21 0849      PT LONG TERM GOAL #1   Title Patient will be demonstrate a decrease in worse pain from 10/10 to 7/10 to be able sit for a prolonged period of time.    Baseline 04/20/21: 6/10    Time 8    Period Weeks    Status Achieved      PT LONG TERM GOAL #2   Title Patient will improve her lumbar range of motion to 50% of normal limits to be able to return to exercise at the gym    Time 8    Period Weeks    Status New      PT LONG TERM GOAL #3   Title Patient will be able to comfortably sit for 45 minutes to be able to perform occupational tasks.    Time 8    Period Weeks    Status New                 Plan - 04/20/21 0819    Clinical Impression Statement Patient continues to make progress and is still working towards improving lumbar range of motion and tolerance to sitting comfortably for prolonged period of time at work with minimal pain. Patient reports 6/10 worse pain now throughout the duration of therapy session in comparison to 10/10 worse pain at inital evaluation. Goals will be formally re-assesed during next visit. Patient continues to show improvement with hip abduction/extension strengthening exercises. Patient demonstrates difficulty with hip flexion exercises such as knee to chest and hooklying braced marches limited  by LBP. Therapy sessions will continue to focus on hip flexor/extensor/abductor strengthening and core stabilization exercises to decrease pain and symptom reproduction. Patient will further benefit from skilled therapy to return to prior level of function.    Personal Factors and Comorbidities Age;Comorbidity 3+;Profession    Comorbidities MS, prior surgery, chronic low back pain, chronic neck pain, fibromyalgia  Examination-Activity Limitations Bed Mobility;Squat;Lift;Stairs;Bend;Sit;Sleep    Examination-Participation Restrictions Occupation;Cleaning;Personal Finances;Yard Work;Community Activity    Stability/Clinical Decision Making Evolving/Moderate complexity    Rehab Potential Fair    PT Frequency 2x / week    PT Duration 8 weeks    PT Treatment/Interventions Therapeutic exercise;Therapeutic activities;Patient/family education;Passive range of motion;Manual techniques;Dry needling    PT Next Visit Plan Work on strengthening paraspinal muscles and glutes and increasing lumbar extension; thoracic mobility.    PT Home Exercise Plan standing lumbar extension, sit to stand, pelvic tilt    Consulted and Agree with Plan of Care Patient           Patient will benefit from skilled therapeutic intervention in order to improve the following deficits and impairments:  Pain,Hypomobility,Decreased strength,Decreased range of motion,Decreased endurance,Decreased activity tolerance,Increased edema  Visit Diagnosis: Muscle weakness (generalized)  Muscle spasm of back  Abnormal posture  Chronic bilateral low back pain with bilateral sciatica     Problem List Patient Active Problem List   Diagnosis Date Noted  . Type 2 diabetes mellitus with pressure callus ([place]) 03/20/2021  . Diabetes mellitus type 2, diet-controlled ([place]) 03/20/2021  . Onychomycosis of multiple toenails with type 2 diabetes mellitus ([place]) 03/20/2021  . Chronic idiopathic constipation 03/20/2021  . Lumbar foraminal  stenosis 03/08/2021  . Myalgia due to statin 05/26/2020  . History of lumbar fusion 11/22/2019  . GERD (gastroesophageal reflux disease) 04/28/2018  . Pure hypercholesterolemia 04/28/2018  . Diverticulosis 04/28/2018  . RLS (restless legs syndrome) 04/28/2018  . Angina pectoris ([place]) 04/28/2018  . Vitamin D deficiency 12/27/2017  . Chronic fatigue, unspecified 12/27/2017  . Intractable migraine without aura and without status migrainosus 10/17/2015  . DDD (degenerative disc disease), cervical 05/23/2015  . Bilateral occipital neuralgia 05/23/2015  . DDD (degenerative disc disease), lumbar 05/23/2015  . Multiple sclerosis ([place]) 05/23/2015  . Cervical disc disorder with radiculopathy of cervical region 04/20/2015  . Right arm numbness 04/20/2015  . Right arm weakness 04/20/2015    Salem Caster. Fairly IV, PT, DPT Physical Therapist- [facility]  04/20/2021, 9:40 AM  [facility] [address] [place], [address] Phone: [phone]   Fax:  [phone]  Name: Ashlee Richardson MRN: [id number] Date of Birth: 1956/07/11

## 2021-04-20 NOTE — Telephone Encounter (Signed)
Copied from [place] [id number]. Topic: General - Other >> Apr 20, 2021  1:38 PM Tessa Lerner A wrote: Reason for CRM: Patient has made contact to notify their PCP that the pain in their left side continues and they're now experiencing nausea  Patient began experiencing nausea roughly two weeks ago  Patient would like to know if a prescription can be given to help with the discomfort  Patient declined to speak with nurse triage or make an appointment for a visit at the time of call with agent  Please contact to further advise if possible

## 2021-04-24 ENCOUNTER — Other Ambulatory Visit: Payer: Self-pay | Admitting: Family Medicine

## 2021-04-24 DIAGNOSIS — R110 Nausea: Secondary | ICD-10-CM

## 2021-04-24 NOTE — Telephone Encounter (Signed)
Patient in agreement

## 2021-04-25 ENCOUNTER — Other Ambulatory Visit: Payer: Self-pay

## 2021-04-25 ENCOUNTER — Ambulatory Visit: Payer: Medicare Other | Attending: Orthopaedic Surgery

## 2021-04-25 ENCOUNTER — Encounter: Payer: Self-pay | Admitting: *Deleted

## 2021-04-25 DIAGNOSIS — R293 Abnormal posture: Secondary | ICD-10-CM | POA: Diagnosis not present

## 2021-04-25 DIAGNOSIS — M62830 Muscle spasm of back: Secondary | ICD-10-CM | POA: Diagnosis not present

## 2021-04-25 DIAGNOSIS — G8929 Other chronic pain: Secondary | ICD-10-CM | POA: Insufficient documentation

## 2021-04-25 DIAGNOSIS — M5441 Lumbago with sciatica, right side: Secondary | ICD-10-CM | POA: Diagnosis not present

## 2021-04-25 DIAGNOSIS — M6281 Muscle weakness (generalized): Secondary | ICD-10-CM | POA: Insufficient documentation

## 2021-04-25 DIAGNOSIS — M5442 Lumbago with sciatica, left side: Secondary | ICD-10-CM | POA: Insufficient documentation

## 2021-04-25 NOTE — Therapy (Signed)
[facility] [address], [place], [address] Phone: [phone]   Fax:  [phone]  Physical Therapy Treatment  Patient Details  Name: Ashlee Richardson MRN: [id number] Date of Birth: [birth year] Referring Provider (PT): Lorin Mercy MD   Encounter Date: 04/25/2021   PT End of Session - 04/25/21 0824    Visit Number 11    Number of Visits 17    Date for PT Re-Evaluation 05/02/21    Authorization Type UHC Medicare    Authorization Time Period 03/14/21-06/06/21    PT Start Time 0729    PT Stop Time 0810    PT Time Calculation (min) 41 min    Activity Tolerance Patient tolerated treatment well;Patient limited by pain    Behavior During Therapy [employer] for tasks assessed/performed           Past Medical History:  Diagnosis Date  . B12 deficiency 12/27/2017  . Diverticulitis   . Fibromyalgia   . GERD (gastroesophageal reflux disease)   . High cholesterol   . Migraine   . MS (multiple sclerosis) ([place])   . PUD (peptic ulcer disease) 04/28/2018    Past Surgical History:  Procedure Laterality Date  . BACK SURGERY  1991?   Pt usure of date  . BREAST BIOPSY Right   . COLONOSCOPY  09/2007  . COLONOSCOPY WITH PROPOFOL N/A 08/28/2017   Procedure: COLONOSCOPY WITH PROPOFOL;  Surgeon: Christene Lye, MD;  Location: [facility] ENDOSCOPY;  Service: Endoscopy;  Laterality: N/A;  . ESOPHAGOGASTRODUODENOSCOPY (EGD) WITH PROPOFOL N/A 11/24/2018   Procedure: ESOPHAGOGASTRODUODENOSCOPY (EGD) WITH PROPOFOL;  Surgeon: Lin Landsman, MD;  Location: [facility] ENDOSCOPY;  Service: Gastroenterology;  Laterality: N/A;  . Foot sugery Right    3rd toe from the right"    There were no vitals filed for this visit.   Subjective Assessment - 04/25/21 0820    Subjective Patient states she has had an increase in LBP over the last couple of days. Patient also reports feeling nauseous this morning. Believes it is from her diverticulitis.     Pertinent History Ashlee Richardson is a 43yoF who comes to OPPT for ongoing chronic low back pain with bilateral sciatica. Patient also reports L sided neck pain that radiates to L arm. Onset ~30ya, recent exacerbation December 2021 which has interfered with sleep. 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 Patient would like to work out for fitness to decrease pain with concerns of disc deteriorating overtime. Patient says that physical therapy must happen prior to eligibility for low back surgery.    Limitations Sitting    How long can you sit comfortably? 5 minutes    How long can you walk comfortably? 2 miles    Patient Stated Goals To return to physical activity and gym exercises    Pain Onset Other (comment)   3 months ago   Pain Score 8    Pain Location Back    Pain Orientation Left    Pain Onset More than a month ago    Pain Onset More than a month ago           TREATMENT Therapeutic Exercise  Hook lying posterior pelvictilt2 x 10 Glute bridges 2 x 10   ~ 30% of motion off the treatment table  HooklyingLower Trunk Rotations 2 x 10 w/ 3 second holds, bilat. L LBP withR LTR with arms overhead. After VC's to relax arms by side, pt reported improvements in R LBP.  Seatedphysioballroll-outs 20x5secH Seatedphysioballroll-outsc alternating side lateral deviation20x5secH(10 each direction) B Rotary hip machine abduction 25# 2 x 10  B Rotary hip machine extension 25# 2 x 10  Treadmill 3.0 MPH speed x 7 mins  Frequent BUE support of bars needed   Performed to increase efficiency in ambulation and alleviate LBP while increasing mobility. Increase in LBP today so unable to tolerate full 10 min.  Knee to chest in supine (holding both legs at the same time) 2 x 10   Patient used a strap around knees to perform exercise  Seated thoracic extension with pillow in thoracolumbar region 3 x 10  Hip flexor hooklying with alternating marches 2 x 10        Performedto addresspain and spasms   PT Education - 04/25/21 0823    Education Details form/technique with exercise    Person(s) Educated Patient    Methods Explanation;Demonstration    Comprehension Returned demonstration;Verbalized understanding            PT Short Term Goals - 03/14/21 1643      PT SHORT TERM GOAL #1   Title Patient will improve FOTO score from 57 to 60 to indicate improvement with ambulation.    Time 2    Period Weeks    Status New    Target Date 03/28/21             PT Long Term Goals - 04/20/21 0849      PT LONG TERM GOAL #1   Title Patient will be demonstrate a decrease in worse pain from 10/10 to 7/10 to be able sit for a prolonged period of time.    Baseline 04/20/21: 6/10    Time 8    Period Weeks    Status Achieved      PT LONG TERM GOAL #2   Title Patient will improve her lumbar range of motion to 50% of normal limits to be able to return to exercise at the gym    Time 8    Period Weeks    Status New      PT LONG TERM GOAL #3   Title Patient will be able to comfortably sit for 45 minutes to be able to perform occupational tasks.    Time 8    Period Weeks    Status New                 Plan - 04/25/21 0825    Clinical Impression Statement Therapy session focused on improve LE muscle strength and increase thoracolumbar moblity. Overall, patient continues to demonstrate ability to tolerate and perform hip abductor/extension strengthening exercises. However, patient still has difficulty with performing thoracic extension and LTR exercises limited by pain. Pt states this is her 6th week of PT and wishes to be discharged at Thursday's session and be re-evaluated by MD. PT will plan to D/c pt on Thursday.    Personal Factors and Comorbidities Age;Comorbidity 3+;Profession    Comorbidities MS, prior surgery, chronic low back pain, chronic neck pain, fibromyalgia    Examination-Activity Limitations Bed  Mobility;Squat;Lift;Stairs;Bend;Sit;Sleep    Examination-Participation Restrictions Occupation;Cleaning;Personal Finances;Yard Work;Community Activity    Stability/Clinical Decision Making Evolving/Moderate complexity    Rehab Potential Fair    PT Frequency 2x / week    PT Duration 8 weeks    PT Treatment/Interventions Therapeutic exercise;Therapeutic activities;Patient/family education;Passive range of motion;Manual techniques;Dry needling    PT Next Visit Plan Work on strengthening paraspinal muscles and glutes and increasing lumbar extension; thoracic mobility.  PT Home Exercise Plan standing lumbar extension, sit to stand, pelvic tilt    Consulted and Agree with Plan of Care Patient           Patient will benefit from skilled therapeutic intervention in order to improve the following deficits and impairments:  Pain,Hypomobility,Decreased strength,Decreased range of motion,Decreased endurance,Decreased activity tolerance,Increased edema  Visit Diagnosis: Muscle weakness (generalized)  Muscle spasm of back  Abnormal posture     Problem List Patient Active Problem List   Diagnosis Date Noted  . Type 2 diabetes mellitus with pressure callus ([place]) 03/20/2021  . Diabetes mellitus type 2, diet-controlled ([place]) 03/20/2021  . Onychomycosis of multiple toenails with type 2 diabetes mellitus ([place]) 03/20/2021  . Chronic idiopathic constipation 03/20/2021  . Lumbar foraminal stenosis 03/08/2021  . Myalgia due to statin 05/26/2020  . History of lumbar fusion 11/22/2019  . GERD (gastroesophageal reflux disease) 04/28/2018  . Pure hypercholesterolemia 04/28/2018  . Diverticulosis 04/28/2018  . RLS (restless legs syndrome) 04/28/2018  . Angina pectoris ([place]) 04/28/2018  . Vitamin D deficiency 12/27/2017  . Chronic fatigue, unspecified 12/27/2017  . Intractable migraine without aura and without status migrainosus 10/17/2015  . DDD (degenerative disc disease), cervical 05/23/2015  .  Bilateral occipital neuralgia 05/23/2015  . DDD (degenerative disc disease), lumbar 05/23/2015  . Multiple sclerosis ([place]) 05/23/2015  . Cervical disc disorder with radiculopathy of cervical region 04/20/2015  . Right arm numbness 04/20/2015  . Right arm weakness 04/20/2015    Salem Caster. Fairly IV, PT, DPT Physical Therapist- [facility]  04/25/2021, 9:08 AM  [facility] [address] [place], [address] Phone: [phone]   Fax:  [phone]  Name: Ashlee Richardson MRN: [id number] Date of Birth: [birth year]

## 2021-04-27 ENCOUNTER — Ambulatory Visit: Payer: Medicare Other

## 2021-04-27 ENCOUNTER — Other Ambulatory Visit: Payer: Self-pay

## 2021-04-27 DIAGNOSIS — M5441 Lumbago with sciatica, right side: Secondary | ICD-10-CM | POA: Diagnosis not present

## 2021-04-27 DIAGNOSIS — G8929 Other chronic pain: Secondary | ICD-10-CM | POA: Diagnosis not present

## 2021-04-27 DIAGNOSIS — M6281 Muscle weakness (generalized): Secondary | ICD-10-CM

## 2021-04-27 DIAGNOSIS — M62830 Muscle spasm of back: Secondary | ICD-10-CM

## 2021-04-27 DIAGNOSIS — R293 Abnormal posture: Secondary | ICD-10-CM

## 2021-04-27 DIAGNOSIS — M5442 Lumbago with sciatica, left side: Secondary | ICD-10-CM | POA: Diagnosis not present

## 2021-04-27 NOTE — Therapy (Signed)
[facility] [address] [place], [address] Phone: [phone]   Fax:  [phone]  Physical Therapy Treatment/Discharge Summary  Patient Details  Name: Ashlee Richardson MRN: [id number] Date of Birth: 1956/01/06 Referring Provider (PT): Lorin Mercy MD   Encounter Date: 04/27/2021   PT End of Session - 04/27/21 0812    Visit Number 12    Number of Visits 17    Date for PT Re-Evaluation 05/02/21    Authorization Type [employer] Medicare    Authorization Time Period 03/14/21-06/06/21    PT Start Time 0726    PT Stop Time 0805    PT Time Calculation (min) 39 min    Activity Tolerance Patient tolerated treatment well;Patient limited by pain    Behavior During Therapy [facility] for tasks assessed/performed           Past Medical History:  Diagnosis Date  . B12 deficiency 12/27/2017  . Diverticulitis   . Fibromyalgia   . GERD (gastroesophageal reflux disease)   . High cholesterol   . Migraine   . MS (multiple sclerosis) ([place])   . PUD (peptic ulcer disease) 04/28/2018    Past Surgical History:  Procedure Laterality Date  . BACK SURGERY  1991?   Pt usure of date  . BREAST BIOPSY Right   . COLONOSCOPY  09/2007  . COLONOSCOPY WITH PROPOFOL N/A 08/28/2017   Procedure: COLONOSCOPY WITH PROPOFOL;  Surgeon: Christene Lye, MD;  Location: [facility] ENDOSCOPY;  Service: Endoscopy;  Laterality: N/A;  . ESOPHAGOGASTRODUODENOSCOPY (EGD) WITH PROPOFOL N/A 11/24/2018   Procedure: ESOPHAGOGASTRODUODENOSCOPY (EGD) WITH PROPOFOL;  Surgeon: Lin Landsman, MD;  Location: [facility] ENDOSCOPY;  Service: Gastroenterology;  Laterality: N/A;  . Foot sugery Right    3rd toe from the right"    There were no vitals filed for this visit.   Subjective Assessment - 04/27/21 0733    Subjective Patient reports no significant changes in symptoms or LBP.    Pertinent History Ashlee Richardson is a 47yoF who comes to OPPT for ongoing chronic low back  pain with bilateral sciatica. Patient also reports L sided neck pain that radiates to L arm. Onset ~30ya, recent exacerbation December 2021 which has interfered with sleep. 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 Patient would like to work out for fitness to decrease pain with concerns of disc deteriorating overtime. Patient says that physical therapy must happen prior to eligibility for low back surgery.    Limitations Sitting    How long can you sit comfortably? 5 minutes    How long can you walk comfortably? 2 miles    Patient Stated Goals To return to physical activity and gym exercises    Currently in Pain? No/denies    Pain Onset Other (comment)   3 months ago   Pain Onset More than a month ago    Pain Onset More than a month ago            Refer to clinical impression and goals for D/c summary.  TREATMENT  Therapeutic Exercise/Home Exercise Program   Hook lying posterior pelvic tilt 2 x 10  Glute bridges 2 x 10   ~ 30% of motion off the treatment table  Hooklying Lower Trunk Rotations 2  x 10 w/ 3 second holds, bilat. L LBP with R LTR with arms overhead. After VC's to relax arms by side, pt reported improvements in R LBP.  Treadmill 3.0 MPH speed x 5 mins  Frequent BUE support of bars needed   Performed to increase efficiency in ambulation and alleviate LBP while increasing mobility. Increase in LBP today so unable to tolerate full 10 min.  Knee to chest in supine (holding both legs at the same time) 2 x 10   Patient used a strap around knees to perform exercise  Hip flexor in sitting with alternating marches w/RTB around knees  x 5  Standing lumbar flexion stretch with towel slides across support surface 5x5secH. VC for patient to add lateral deviations to exercise for HEP.    Discussed standing hip abduction/extension exercises with UE support. VC for patient to add theraband resistance around ankles.         Performed to address pain and  spasms    PT Education - 04/27/21 0811    Education Details form/technique with exercise    Person(s) Educated Patient    Methods Explanation;Demonstration    Comprehension Verbalized understanding;Returned demonstration            PT Short Term Goals - 04/27/21 1007      PT SHORT TERM GOAL #1   Title Patient will improve FOTO score from 57 to 60 to indicate improvement with ambulation.    Baseline 04/27/21: 59    Time 2    Period Weeks    Status Partially Met    Target Date 03/28/21             PT Long Term Goals - 04/27/21 0815      PT LONG TERM GOAL #1   Title Patient will be demonstrate a decrease in worse pain from 10/10 to 7/10 to be able sit for a prolonged period of time.    Baseline 04/20/21: 6/10 04/27/21: 6/10    Time 8    Period Weeks    Status Achieved    Target Date 04/27/21      PT LONG TERM GOAL #2   Title Patient will improve her lumbar range of motion to 50% of normal limits to be able to return to exercise at the gym    Baseline 04/27/21: still limited    Time 8    Period Weeks    Status Partially Met      PT LONG TERM GOAL #3   Title Patient will be able to comfortably sit for 45 minutes to be able to perform occupational tasks.    Baseline 04/27/21: 30 minutes    Time 8    Period Weeks    Status Partially Met                 Plan - 04/27/21 1009    Clinical Impression Statement Overall, patient has shown progress towards functional goals. Patient reports 6/10 worse pain in comparison to 10/10 worse pain at initial evaluation. Patient states that she feels as though her reproduction of symptoms has gotten better since the start of PT services. Patient still has limitations in lumbar ranges of motion and can only sit comfortably for 30 minutes while performing occupational tasks. Patient's FOTO score is 87; however, 28 was the pre-determined FOTO score that she was expected to meet. Patient received HEP for lumbar and hip mobility exercises,  therabands were given to pt to add resistance for strengthening exercises. VC and returned demonstration was performed to improve compliance and independence with exercises. See therapy session treatment note for specific HEP. Patient reports wanting to be discharged after 6 weeks of PT services to return to MD for further  evaluation of conditions.    Personal Factors and Comorbidities Age;Comorbidity 3+;Profession    Comorbidities MS, prior surgery, chronic low back pain, chronic neck pain, fibromyalgia    Examination-Activity Limitations Bed Mobility;Squat;Lift;Stairs;Bend;Sit;Sleep    Examination-Participation Restrictions Occupation;Cleaning;Personal Finances;Yard Work;Community Activity    Stability/Clinical Decision Making Evolving/Moderate complexity    Rehab Potential Fair    PT Frequency 2x / week    PT Duration 8 weeks    PT Treatment/Interventions Therapeutic exercise;Therapeutic activities;Patient/family education;Passive range of motion;Manual techniques;Dry needling    PT Next Visit Plan Work on strengthening paraspinal muscles and glutes and increasing lumbar extension; thoracic mobility.    PT Home Exercise Plan standing lumbar extension, sit to stand, pelvic tilt    Consulted and Agree with Plan of Care Patient           Patient will benefit from skilled therapeutic intervention in order to improve the following deficits and impairments:  Pain,Hypomobility,Decreased strength,Decreased range of motion,Decreased endurance,Decreased activity tolerance,Increased edema  Visit Diagnosis: Muscle weakness (generalized)  Muscle spasm of back  Abnormal posture  Chronic bilateral low back pain with bilateral sciatica     Problem List Patient Active Problem List   Diagnosis Date Noted  . Type 2 diabetes mellitus with pressure callus ([place]) 03/20/2021  . Diabetes mellitus type 2, diet-controlled ([place]) 03/20/2021  . Onychomycosis of multiple toenails with type 2 diabetes  mellitus ([place]) 03/20/2021  . Chronic idiopathic constipation 03/20/2021  . Lumbar foraminal stenosis 03/08/2021  . Myalgia due to statin 05/26/2020  . History of lumbar fusion 11/22/2019  . GERD (gastroesophageal reflux disease) 04/28/2018  . Pure hypercholesterolemia 04/28/2018  . Diverticulosis 04/28/2018  . RLS (restless legs syndrome) 04/28/2018  . Angina pectoris ([place]) 04/28/2018  . Vitamin D deficiency 12/27/2017  . Chronic fatigue, unspecified 12/27/2017  . Intractable migraine without aura and without status migrainosus 10/17/2015  . DDD (degenerative disc disease), cervical 05/23/2015  . Bilateral occipital neuralgia 05/23/2015  . DDD (degenerative disc disease), lumbar 05/23/2015  . Multiple sclerosis ([place]) 05/23/2015  . Cervical disc disorder with radiculopathy of cervical region 04/20/2015  . Right arm numbness 04/20/2015  . Right arm weakness 04/20/2015    Salem Caster. Fairly IV, PT, DPT Physical Therapist- [facility]  04/27/2021, 12:47 PM  [facility] [address], [place], [address] Phone: [phone]   Fax:  [phone]  Name: TEDDI BADALAMENTI MRN: [id number] Date of Birth: August 15, 1956

## 2021-05-02 ENCOUNTER — Ambulatory Visit: Payer: Medicare Other

## 2021-05-03 ENCOUNTER — Ambulatory Visit: Payer: Self-pay | Admitting: *Deleted

## 2021-05-03 DIAGNOSIS — Z20822 Contact with and (suspected) exposure to covid-19: Secondary | ICD-10-CM | POA: Diagnosis not present

## 2021-05-03 DIAGNOSIS — Z03818 Encounter for observation for suspected exposure to other biological agents ruled out: Secondary | ICD-10-CM | POA: Diagnosis not present

## 2021-05-03 NOTE — Telephone Encounter (Signed)
Covid positive results today. ST only symptom. Isolation time reviewed, health precautions discussed, when to d/c isolation, disinfect household. Monitor breathing and any changes/concerns seek treatment immediately at the ED. Use albuterol inhaler only if needed. Warm tea with honey, throat lozenges, chloraseptic spray and tylenol if needed.  Reason for Disposition . Health Information question, no triage required and triager able to answer question  Answer Assessment - Initial Assessment Questions 1. REASON FOR CALL or QUESTION: "What is your reason for calling today?" or "How can I best help you?" or "What question do you have that I can help answer?"     Covid positive  Protocols used: INFORMATION ONLY CALL - NO TRIAGE-A-AH

## 2021-05-05 ENCOUNTER — Telehealth: Payer: Self-pay | Admitting: Family Medicine

## 2021-05-05 ENCOUNTER — Encounter: Payer: Self-pay | Admitting: Family Medicine

## 2021-05-05 ENCOUNTER — Telehealth (INDEPENDENT_AMBULATORY_CARE_PROVIDER_SITE_OTHER): Payer: Medicare Other | Admitting: Family Medicine

## 2021-05-05 VITALS — Ht 68.0 in | Wt 188.0 lb

## 2021-05-05 DIAGNOSIS — R059 Cough, unspecified: Secondary | ICD-10-CM

## 2021-05-05 DIAGNOSIS — U071 COVID-19: Secondary | ICD-10-CM | POA: Diagnosis not present

## 2021-05-05 DIAGNOSIS — L84 Corns and callosities: Secondary | ICD-10-CM

## 2021-05-05 DIAGNOSIS — E11628 Type 2 diabetes mellitus with other skin complications: Secondary | ICD-10-CM

## 2021-05-05 DIAGNOSIS — G35 Multiple sclerosis: Secondary | ICD-10-CM | POA: Diagnosis not present

## 2021-05-05 MED ORDER — PAXLOVID 20 X 150 MG & 10 X 100MG PO TBPK: 3.0000 | ORAL_TABLET | Freq: Two times a day (BID) | ORAL | 0 refills | Status: DC

## 2021-05-05 NOTE — Telephone Encounter (Signed)
Patient dx with COVID requesting a rx for cough and congestion, patient states she's experiencing thick mucus and its hard for her to cough up, please advise

## 2021-05-05 NOTE — Progress Notes (Addendum)
Name: Ashlee Richardson   MRN: [id number]    DOB: [birth year]   Date:05/05/2021       Progress Note  Subjective  Chief Complaint  COVID-19 positive  I connected with  Philmore Pali  on 05/05/21 at  1:20 PM EDT by a video enabled telemedicine application and verified that I am speaking with the correct person using two identifiers.  I discussed the limitations of evaluation and management by telemedicine and the availability of in person appointments. The patient expressed understanding and agreed to proceed with the virtual visit  Staff also discussed with the patient that there may be a patient responsible charge related to this service. Patient Location: at home  Provider Location: [facility] Additional Individuals present: alone   HPI  COVID-19: she had a sore throat and a productive cough  on Tuesday May 10 th, she tested at home and it was negative, she went to testing site on at [employer] and test was positive  for rapid and PCR. Sore throat has resolved, but still has a wet cough, not able to bring up phlegm. Appetite is normal, denies fatigue, chills, fever, change in bowel movements or SOB. She has noticed mild wheezing. She works at [employer]. She was advised to stay at home until Monday and may return to work on Tuesday if feeling better and not coughing a lot, but to continue wearing a mask for a total of at least 10 days   She received 3 COVID-19 vaccines  High risk patient : MS, DM, CAD  Discussed medication, she states she would like to try Paxlovid   Patient Active Problem List   Diagnosis Date Noted  . Type 2 diabetes mellitus with pressure callus ([place]) 03/20/2021  . Diabetes mellitus type 2, diet-controlled ([place]) 03/20/2021  . Onychomycosis of multiple toenails with type 2 diabetes mellitus ([place]) 03/20/2021  . Chronic idiopathic constipation 03/20/2021  . Lumbar foraminal stenosis 03/08/2021  . Myalgia due to statin 05/26/2020  . History of lumbar fusion 11/22/2019   . GERD (gastroesophageal reflux disease) 04/28/2018  . Pure hypercholesterolemia 04/28/2018  . Diverticulosis 04/28/2018  . RLS (restless legs syndrome) 04/28/2018  . Angina pectoris ([place]) 04/28/2018  . Vitamin D deficiency 12/27/2017  . Chronic fatigue, unspecified 12/27/2017  . Intractable migraine without aura and without status migrainosus 10/17/2015  . DDD (degenerative disc disease), cervical 05/23/2015  . Bilateral occipital neuralgia 05/23/2015  . DDD (degenerative disc disease), lumbar 05/23/2015  . Multiple sclerosis ([place]) 05/23/2015  . Cervical disc disorder with radiculopathy of cervical region 04/20/2015  . Right arm numbness 04/20/2015  . Right arm weakness 04/20/2015    Past Surgical History:  Procedure Laterality Date  . BACK SURGERY  1991?   Pt usure of date  . BREAST BIOPSY Right   . COLONOSCOPY  09/2007  . COLONOSCOPY WITH PROPOFOL N/A 08/28/2017   Procedure: COLONOSCOPY WITH PROPOFOL;  Surgeon: Christene Lye, MD;  Location: [facility] ENDOSCOPY;  Service: Endoscopy;  Laterality: N/A;  . ESOPHAGOGASTRODUODENOSCOPY (EGD) WITH PROPOFOL N/A 11/24/2018   Procedure: ESOPHAGOGASTRODUODENOSCOPY (EGD) WITH PROPOFOL;  Surgeon: Lin Landsman, MD;  Location: [facility] ENDOSCOPY;  Service: Gastroenterology;  Laterality: N/A;  . Foot sugery Right    3rd toe from the right"    Family History  Problem Relation Age of Onset  . Heart disease Mother   . Diabetes Mother   . Kidney disease Mother   . Hypertension Mother   . Heart disease Father   . Hyperlipidemia Father   .  Hypertension Father   . Heart disease Other   . Cancer Other        Breast  . Diabetes Other   . Hyperlipidemia Sister   . Hypertension Sister   . Kidney disease Sister   . Diabetes Sister   . Lung cancer Paternal Aunt   . Cancer Maternal Grandmother        breast    Social History   Socioeconomic History  . Marital status: Single    Spouse name: Not on file  . Number of children: 2  .  Years of education: Not on file  . Highest education level: Associate degree: academic program  Occupational History  . 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 Smoking status: Never Smoker  . Smokeless tobacco: Never Used  . Tobacco comment: smoking cessation materials not required  Vaping Use  . Vaping Use: Never used  Substance and Sexual Activity  . Alcohol use: No    Alcohol/week: 0.0 standard drinks  . Drug use: No  . Sexual activity: Yes    Birth control/protection: Post-menopausal  Other Topics Concern  . Not on file  Social History Narrative   Lives at home by herself.   Disable from chronic back pain since 1993   Diagnosed with MS in the 2000's   Social Determinants of Health   Financial Resource Strain: Low Risk   . Difficulty of Paying Living Expenses: Not very hard  Food Insecurity: No Food Insecurity  . Worried About Charity fundraiser in the Last Year: Never true  . Ran Out of Food in the Last Year: Never true  Transportation Needs: No Transportation Needs  . Lack of Transportation (Medical): No  . Lack of Transportation (Non-Medical): No  Physical Activity: Sufficiently Active  . Days of Exercise per Week: 3 days  . Minutes of Exercise per Session: 60 min  Stress: Stress Concern Present  . Feeling of Stress : To some extent  Social Connections: Moderately Integrated  . Frequency of Communication with Friends and Family: More than three times a week  . Frequency of Social Gatherings with Friends and Family: More than three times a week  . Attends Religious Services: More than 4 times per year  . Active Member of Clubs or Organizations: Yes  . Attends Archivist Meetings: More than 4 times per year  . Marital Status: Never married  Intimate Partner Violence: Not At Risk  . Fear of Current or Ex-Partner: No  . Emotionally Abused: No  . Physically Abused: No  .  Sexually Abused: No     Current Outpatient Medications:  .  Calcium-Magnesium-Vitamin D 300-150-400 MG-MG-UNIT TABS, Take by mouth., Disp: , Rfl:  .  cholecalciferol (VITAMIN D3) 25 MCG (1000 UT) tablet, Take 1,000 Units by mouth daily., Disp: , Rfl:  .  DEXILANT 60 MG capsule, TAKE 1 CAPSULE BY MOUTH EVERY DAY, Disp: 90 capsule, Rfl: 0 .  Diclofenac-miSOPROStol 75-0.2 MG TBEC, Take by mouth., Disp: , Rfl:  .  donepezil (ARICEPT) 5 MG tablet, Take 1 tablet by mouth daily., Disp: , Rfl:  .  DULoxetine (CYMBALTA) 60 MG capsule, Take 60 mg by mouth daily., Disp: , Rfl:  .  erythromycin ophthalmic ointment, , Disp: , Rfl:  .  ezetimibe (ZETIA) 10 MG tablet, TAKE 1 TABLET BY MOUTH EVERY DAY, Disp: 90 tablet, Rfl: 1 .  Fluticasone-Salmeterol (ADVAIR  DISKUS) 100-50 MCG/DOSE AEPB, Inhale 1 puff into the lungs in the morning and at bedtime., Disp: 60 each, Rfl: 3 .  magnesium oxide (MAG-OX) 400 MG tablet, Take 400 mg by mouth daily., Disp: , Rfl:  .  meclizine (ANTIVERT) 25 MG tablet, Take 25 mg by mouth 3 (three) times daily as needed for dizziness., Disp: , Rfl:  .  meloxicam (MOBIC) 15 MG tablet, TAKE 1 TABLET BY MOUTH EVERY DAY, Disp: 90 tablet, Rfl: 0 .  mirabegron ER (MYRBETRIQ) 50 MG TB24 tablet, Take 1 tablet (50 mg total) by mouth daily., Disp: 30 tablet, Rfl: 11 .  pregabalin (LYRICA) 50 MG capsule, Take 1-2 capsules (50-100 mg total) by mouth at bedtime., Disp: 60 capsule, Rfl: 2 .  rosuvastatin (CRESTOR) 20 MG tablet, TAKE 1 TABLET BY MOUTH EVERY DAY, Disp: 90 tablet, Rfl: 1 .  SUMAtriptan (IMITREX) 100 MG tablet, Take as directed PRN migraines, Disp: , Rfl:  .  terbinafine (LAMISIL) 250 MG tablet, Take 1 tablet (250 mg total) by mouth daily., Disp: 90 tablet, Rfl: 0 .  traZODone (DESYREL) 50 MG tablet, TAKE 1/2 TO 1 TABLET BY MOUTH AT BEDTIME AS NEEDED FOR SLEEP, Disp: 90 tablet, Rfl: 0 .  vitamin B-12 (CYANOCOBALAMIN) 1000 MCG tablet, Take 1,000 mcg by mouth daily., Disp: , Rfl:  .   fluticasone (FLONASE) 50 MCG/ACT nasal spray, SPRAY 1 SPRAY INTO EACH NOSTRIL EVERY DAY (Patient not taking: Reported on 05/05/2021), Disp: , Rfl: 0 .  MURO 128 5 % ophthalmic solution, INSTILL 1 DROP IN RIGHT EYE THREE TIMES PER DAY (Patient not taking: Reported on 05/05/2021), Disp: , Rfl:  .  tiZANidine (ZANAFLEX) 2 MG tablet, TAKE 1 TABLET (2 MG TOTAL) BY MOUTH 3 (THREE) TIMES DAILY. (Patient not taking: Reported on 05/05/2021), Disp: , Rfl:   Allergies  Allergen Reactions  . Fenofibric Acid   . Lipitor [Atorvastatin]   . Nsaids     I personally reviewed active problem list, medication list, allergies, family history, social history with the patient/caregiver today.   ROS  Ten systems reviewed and is negative except as mentioned in HPI   Objective  Virtual encounter, vitals not obtained.  Body mass index is 28.59 kg/m.  Physical Exam  Awake, alert and oriented   PHQ2/9: Depression screen [facility] 2/9 05/05/2021 03/20/2021 12/14/2020 08/11/2020 01/08/2020  Decreased Interest 0 0 0 0 2  Down, Depressed, Hopeless 0 0 0 1 2  PHQ - 2 Score 0 0 0 1 4  Altered sleeping - - - 3 0  Tired, decreased energy - - - 3 3  Change in appetite - - - 0 0  Feeling bad or failure about yourself  - - - 0 0  Trouble concentrating - - - 0 0  Moving slowly or fidgety/restless - - - 0 0  Suicidal thoughts - - - 0 0  PHQ-9 Score - - - 7 7  Difficult doing work/chores - - - Somewhat difficult Somewhat difficult  Some recent data might be hidden   PHQ-2/9 Result is negative.    Fall Risk: Fall Risk  05/05/2021 03/20/2021 12/14/2020 09/20/2020 08/11/2020  Falls in the past year? 0 0 0 0 0  Number falls in past yr: - 0 0 0 0  Injury with Fall? - 0 0 0 0  Comment - - - - -  Risk Factor Category  - - - - -  Risk for fall due to : - - - - No Fall Risks  Risk for fall due to: Comment - - - - -  Follow up Falls prevention discussed - - - Falls prevention discussed     Assessment & Plan  1.  COVID-19  - Nirmatrelvir & Ritonavir (PAXLOVID) 20 x 150 MG & 10 x 100MG  TBPK; Take 3 tablets by mouth in the morning and at bedtime.  Dispense: 30 tablet; Refill: 0  Discussed medication Advised pulse oximeter and go to [facility] if worsening of symptoms or pulse ox below 90 Go back to work on Tuesday if feeling better and afebrile   2. Coughing  Advised fluids and mucinex  3. MS (multiple sclerosis) ([place])   4. Type 2 diabetes mellitus with pressure callus ([facility])  Advised to monitor glucose  I discussed the assessment and treatment plan with the patient. The patient was provided an opportunity to ask questions and all were answered. The patient agreed with the plan and demonstrated an understanding of the instructions.  The patient was advised to call back or seek an in-person evaluation if the symptoms worsen or if the condition fails to improve as anticipated.  I provided 25  minutes of non-face-to-face time during this encounter.

## 2021-05-05 NOTE — Telephone Encounter (Signed)
Virtual visit scheduled.  

## 2021-05-08 DIAGNOSIS — Z03818 Encounter for observation for suspected exposure to other biological agents ruled out: Secondary | ICD-10-CM | POA: Diagnosis not present

## 2021-05-08 DIAGNOSIS — Z20822 Contact with and (suspected) exposure to covid-19: Secondary | ICD-10-CM | POA: Diagnosis not present

## 2021-05-10 ENCOUNTER — Encounter: Payer: Medicare Other | Admitting: Podiatry

## 2021-05-24 ENCOUNTER — Encounter: Payer: Self-pay | Admitting: Podiatry

## 2021-05-24 ENCOUNTER — Other Ambulatory Visit: Payer: Self-pay

## 2021-05-24 ENCOUNTER — Ambulatory Visit (INDEPENDENT_AMBULATORY_CARE_PROVIDER_SITE_OTHER): Payer: Medicare Other | Admitting: Podiatry

## 2021-05-24 DIAGNOSIS — M79676 Pain in unspecified toe(s): Secondary | ICD-10-CM | POA: Diagnosis not present

## 2021-05-24 DIAGNOSIS — L600 Ingrowing nail: Secondary | ICD-10-CM | POA: Diagnosis not present

## 2021-05-24 DIAGNOSIS — L603 Nail dystrophy: Secondary | ICD-10-CM

## 2021-05-24 DIAGNOSIS — Z79899 Other long term (current) drug therapy: Secondary | ICD-10-CM | POA: Diagnosis not present

## 2021-05-24 DIAGNOSIS — B351 Tinea unguium: Secondary | ICD-10-CM | POA: Diagnosis not present

## 2021-05-24 MED ORDER — TERBINAFINE HCL 250 MG PO TABS
250.0000 mg | ORAL_TABLET | Freq: Every day | ORAL | 0 refills | Status: DC
Start: 2021-05-24 — End: 2021-08-30

## 2021-05-24 NOTE — Progress Notes (Signed)
She presents today she is only taken 90 days worth of her pills she was concerned about a test that she had done that mention the liver but at this point she has not taken her last 30 pills.  Objective: Vitals are stable she alert oriented x3.  Toenails appear to be growing out very nicely.  She has sharp innervated nail margin to the hallux right which I trimmed out for her today there is no signs of purulence or infection.  Otherwise the nail plates appear to be healing by about 30 to 50%.  Assessment: Long-term therapy with Lamisil for onychomycosis Sharp incurvated nail margin today hallux right.  Plan: Debrided that nail for her today and I did write a prescription for blood work.  I went ahead and recommended that she go ahead and finish up her last 30 pills I will see her in 3 months at which time we may need to provide her with another every other day dose.

## 2021-05-25 LAB — COMPREHENSIVE METABOLIC PANEL
ALT: 16 IU/L (ref 0–32)
AST: 17 IU/L (ref 0–40)
Albumin/Globulin Ratio: 2.2 (ref 1.2–2.2)
Albumin: 4.9 g/dL — ABNORMAL HIGH (ref 3.8–4.8)
Alkaline Phosphatase: 72 IU/L (ref 44–121)
BUN/Creatinine Ratio: 15 (ref 12–28)
BUN: 10 mg/dL (ref 8–27)
Bilirubin Total: 0.3 mg/dL (ref 0.0–1.2)
CO2: 25 mmol/L (ref 20–29)
Calcium: 10.1 mg/dL (ref 8.7–10.3)
Chloride: 102 mmol/L (ref 96–106)
Creatinine, Ser: 0.67 mg/dL (ref 0.57–1.00)
Globulin, Total: 2.2 g/dL (ref 1.5–4.5)
Glucose: 89 mg/dL (ref 65–99)
Potassium: 4.9 mmol/L (ref 3.5–5.2)
Sodium: 140 mmol/L (ref 134–144)
Total Protein: 7.1 g/dL (ref 6.0–8.5)
eGFR: 98 mL/min/{1.73_m2} (ref >59)

## 2021-05-30 DIAGNOSIS — G43019 Migraine without aura, intractable, without status migrainosus: Secondary | ICD-10-CM | POA: Diagnosis not present

## 2021-05-31 ENCOUNTER — Other Ambulatory Visit: Payer: Self-pay | Admitting: Neurology

## 2021-05-31 DIAGNOSIS — R9082 White matter disease, unspecified: Secondary | ICD-10-CM

## 2021-06-09 ENCOUNTER — Telehealth: Payer: Self-pay

## 2021-06-09 NOTE — Telephone Encounter (Signed)
Patient has been notified of results.  

## 2021-06-09 NOTE — Telephone Encounter (Signed)
-----   Message from Garrel Ridgel, [place] sent at 05/25/2021  1:35 PM EDT ----- Blood work looks great and may continue medication.

## 2021-06-12 ENCOUNTER — Other Ambulatory Visit: Payer: Self-pay | Admitting: Neurology

## 2021-06-12 ENCOUNTER — Other Ambulatory Visit: Payer: Self-pay

## 2021-06-12 ENCOUNTER — Ambulatory Visit
Admission: RE | Admit: 2021-06-12 | Discharge: 2021-06-12 | Disposition: A | Discharge status: Home / Self Care | Payer: Medicare Other | Source: Ambulatory Visit | Attending: Neurology | Admitting: Neurology

## 2021-06-12 DIAGNOSIS — R519 Headache, unspecified: Secondary | ICD-10-CM | POA: Diagnosis not present

## 2021-06-12 DIAGNOSIS — R9082 White matter disease, unspecified: Secondary | ICD-10-CM

## 2021-06-12 DIAGNOSIS — G319 Degenerative disease of nervous system, unspecified: Secondary | ICD-10-CM | POA: Diagnosis not present

## 2021-06-12 DIAGNOSIS — G9389 Other specified disorders of brain: Secondary | ICD-10-CM | POA: Diagnosis not present

## 2021-06-12 DIAGNOSIS — J019 Acute sinusitis, unspecified: Secondary | ICD-10-CM | POA: Diagnosis not present

## 2021-06-12 MED ORDER — GADOBUTROL 1 MMOL/ML IV SOLN: 8.0000 mL | Freq: Once | INTRAVENOUS | Status: DC | PRN

## 2021-06-21 DIAGNOSIS — L255 Unspecified contact dermatitis due to plants, except food: Secondary | ICD-10-CM | POA: Diagnosis not present

## 2021-06-21 DIAGNOSIS — R21 Rash and other nonspecific skin eruption: Secondary | ICD-10-CM | POA: Diagnosis not present

## 2021-06-28 ENCOUNTER — Ambulatory Visit (INDEPENDENT_AMBULATORY_CARE_PROVIDER_SITE_OTHER): Payer: Medicare Other | Admitting: Orthopaedic Surgery

## 2021-06-28 VITALS — BP 121/66 | HR 69 | Ht 68.0 in | Wt 185.0 lb

## 2021-06-28 DIAGNOSIS — M5136 Other intervertebral disc degeneration, lumbar region: Secondary | ICD-10-CM

## 2021-06-28 DIAGNOSIS — M48061 Spinal stenosis, lumbar region without neurogenic claudication: Secondary | ICD-10-CM | POA: Diagnosis not present

## 2021-06-28 NOTE — Progress Notes (Signed)
Office Visit Note   Patient: Ashlee Richardson           Date of Birth: [birth year]           MRN: [id number] Visit Date: 06/28/2021              Requested by: Steele Sizer, [place] [facility],  [place] [address] PCP: Steele Sizer, MD   Assessment & Plan: Visit Diagnoses:  1. Lumbar foraminal stenosis   2. DDD (degenerative disc disease), lumbar     Plan: Patient with previous L4-S1 fusion by Dr. Raquel Sarna in [facility].  He has adjacent degenerative changes at L3-4 with disc space narrowing and spurring.  Patient like to return couple months and we will set her up for some physical therapy for treatment for her back pain core strengthening.  She states if she is not getting better she like to proceed with myelogram CT scan in the fall and then consider surgical intervention if indicated.  Follow-Up Instructions: Return in about 2 months (around 08/29/2021).   Orders:  Orders Placed This Encounter  Procedures   Ambulatory referral to Physical Therapy   No orders of the defined types were placed in this encounter.     Procedures: No procedures performed   Clinical Data: No additional findings.   Subjective: Chief Complaint  Patient presents with   Lower Back - Pain    HPI 65 year old female returns with ongoing problems with significant persistent back pain.  Last seen in April.  She had some abdominal problems that have bothered her and CT myelogram lumbar was delayed.  Patient states she is thinking about possibly surgery in October.  She is used meloxicam with slight relief.  States her stomach problems are better but back continues to bother her.  She has diagnosis of advanced white matter disease on MRI of the brain nonspecific possibly related to migraine headaches, previous infection or inflammatory process or possible MS or vasculitis among others.  Some encephalomalacia and gliosis within the midline posterior cerebellum.  Cervical  MRI scan January 2021 showed spondylosis stable from 2016.  Some disc bulges at C4-5 and some foraminal narrowing bilaterally at C5-6.  Review of Systems 14 point system update unchanged from 11/17/2019 office visit.   Objective: Vital Signs: BP 121/66   Pulse 69   Ht 5\' 8"  (1.727 m)   Wt 185 lb (83.9 kg)   BMI 28.13 kg/m   Physical Exam Constitutional:      Appearance: She is well-developed.  HENT:     Head: Normocephalic.     Right Ear: External ear normal.     Left Ear: External ear normal. There is no impacted cerumen.  Eyes:     Pupils: Pupils are equal, round, and reactive to light.  Neck:     Thyroid: No thyromegaly.     Trachea: No tracheal deviation.  Cardiovascular:     Rate and Rhythm: Normal rate.  Pulmonary:     Effort: Pulmonary effort is normal.  Abdominal:     Palpations: Abdomen is soft.  Musculoskeletal:     Cervical back: No rigidity.  Skin:    General: Skin is warm and dry.  Neurological:     Mental Status: She is alert and oriented to person, place, and time.  Psychiatric:        Behavior: Behavior normal.    Ortho Exam patient slow getting sitting standing she has some isolation with ambulation with decreased balance.  Well-healed lumbar incision.  Negative logroll of the hips.  Specialty Comments:  No specialty comments available.  Imaging: No results found.   PMFS History: Patient Active Problem List   Diagnosis Date Noted   Type 2 diabetes mellitus with pressure callus ([place]) 03/20/2021   Diabetes mellitus type 2, diet-controlled ([place]) 03/20/2021   Onychomycosis of multiple toenails with type 2 diabetes mellitus ([place]) 03/20/2021   Chronic idiopathic constipation 03/20/2021   Lumbar foraminal stenosis 03/08/2021   Myalgia due to statin 05/26/2020   History of lumbar fusion 11/22/2019   GERD (gastroesophageal reflux disease) 04/28/2018   Pure hypercholesterolemia 04/28/2018   Diverticulosis 04/28/2018   RLS (restless legs syndrome)  04/28/2018   Angina pectoris ([place]) 04/28/2018   Vitamin D deficiency 12/27/2017   Chronic fatigue, unspecified 12/27/2017   Intractable migraine without aura and without status migrainosus 10/17/2015   DDD (degenerative disc disease), cervical 05/23/2015   Bilateral occipital neuralgia 05/23/2015   DDD (degenerative disc disease), lumbar 05/23/2015   Multiple sclerosis ([place]) 05/23/2015   Cervical disc disorder with radiculopathy of cervical region 04/20/2015   Right arm numbness 04/20/2015   Right arm weakness 04/20/2015   Past Medical History:  Diagnosis Date   B12 deficiency 12/27/2017   Diverticulitis    Fibromyalgia    GERD (gastroesophageal reflux disease)    High cholesterol    Migraine    MS (multiple sclerosis) ([place])    PUD (peptic ulcer disease) 04/28/2018    Family History  Problem Relation Age of Onset   Heart disease Mother    Diabetes Mother    Kidney disease Mother    Hypertension Mother    Heart disease Father    Hyperlipidemia Father    Hypertension Father    Heart disease Other    Cancer Other        Breast   Diabetes Other    Hyperlipidemia Sister    Hypertension Sister    Kidney disease Sister    Diabetes Sister    Lung cancer Paternal Aunt    Cancer Maternal Grandmother        breast    Past Surgical History:  Procedure Laterality Date   BACK SURGERY  1991?   Pt usure of date   BREAST BIOPSY Right    COLONOSCOPY  09/2007   COLONOSCOPY WITH PROPOFOL N/A 08/28/2017   Procedure: COLONOSCOPY WITH PROPOFOL;  Surgeon: Christene Lye, MD;  Location: [facility] ENDOSCOPY;  Service: Endoscopy;  Laterality: N/A;   ESOPHAGOGASTRODUODENOSCOPY (EGD) WITH PROPOFOL N/A 11/24/2018   Procedure: ESOPHAGOGASTRODUODENOSCOPY (EGD) WITH PROPOFOL;  Surgeon: Lin Landsman, MD;  Location: [facility] ENDOSCOPY;  Service: Gastroenterology;  Laterality: N/A;   Foot sugery Right    3rd toe from the right"   Social History   Occupational History   Occupation:  Agricultural consultant: [employer]    Comment: part time    Employer: DISABLED  Tobacco Use   Smoking status: Never   Smokeless tobacco: Never   Tobacco comments:    smoking cessation materials not required  Vaping Use   Vaping Use: Never used  Substance and Sexual Activity   Alcohol use: No    Alcohol/week: 0.0 standard drinks   Drug use: No   Sexual activity: Yes    Birth control/protection: Post-menopausal

## 2021-07-19 DIAGNOSIS — M2550 Pain in unspecified joint: Secondary | ICD-10-CM | POA: Diagnosis not present

## 2021-07-19 DIAGNOSIS — G35 Multiple sclerosis: Secondary | ICD-10-CM | POA: Diagnosis not present

## 2021-07-19 DIAGNOSIS — R0602 Shortness of breath: Secondary | ICD-10-CM | POA: Diagnosis not present

## 2021-07-19 DIAGNOSIS — R5382 Chronic fatigue, unspecified: Secondary | ICD-10-CM | POA: Diagnosis not present

## 2021-07-19 DIAGNOSIS — I208 Other forms of angina pectoris: Secondary | ICD-10-CM | POA: Diagnosis not present

## 2021-07-19 DIAGNOSIS — E7800 Pure hypercholesterolemia, unspecified: Secondary | ICD-10-CM | POA: Diagnosis not present

## 2021-07-19 DIAGNOSIS — K219 Gastro-esophageal reflux disease without esophagitis: Secondary | ICD-10-CM | POA: Diagnosis not present

## 2021-07-24 ENCOUNTER — Ambulatory Visit: Payer: Medicare Other | Attending: Orthopaedic Surgery | Admitting: Physical Therapy

## 2021-07-24 ENCOUNTER — Encounter: Payer: Self-pay | Admitting: Physical Therapy

## 2021-07-24 DIAGNOSIS — M5441 Lumbago with sciatica, right side: Secondary | ICD-10-CM | POA: Insufficient documentation

## 2021-07-24 DIAGNOSIS — G8929 Other chronic pain: Secondary | ICD-10-CM | POA: Diagnosis not present

## 2021-07-24 DIAGNOSIS — M5442 Lumbago with sciatica, left side: Secondary | ICD-10-CM | POA: Insufficient documentation

## 2021-07-24 NOTE — Therapy (Signed)
[facility] [address], [place], [address] Phone: [phone]   Fax:  [phone]  Physical Therapy Evaluation  Patient Details  Name: Ashlee Richardson MRN: [id number] Date of Birth: [birth year] Referring Provider (PT): Lorin Mercy MD   Encounter Date: 07/24/2021   PT End of Session - 07/24/21 1148     Visit Number 1    Number of Visits 17    Date for PT Re-Evaluation 09/22/21    Authorization Type U[employer] Medicare    PT Start Time 1031    PT Stop Time 1116    PT Time Calculation (min) 45 min    Activity Tolerance Patient tolerated treatment well;Patient limited by pain    Behavior During Therapy [facility] for tasks assessed/performed             Past Medical History:  Diagnosis Date   B12 deficiency 12/27/2017   Diverticulitis    Fibromyalgia    GERD (gastroesophageal reflux disease)    High cholesterol    Migraine    MS (multiple sclerosis) ([place])    PUD (peptic ulcer disease) 04/28/2018    Past Surgical History:  Procedure Laterality Date   [place]?   Pt usure of date   BREAST BIOPSY Right    COLONOSCOPY  09/2007   COLONOSCOPY WITH PROPOFOL N/A 08/28/2017   Procedure: COLONOSCOPY WITH PROPOFOL;  Surgeon: Christene Lye, MD;  Location: [facility] ENDOSCOPY;  Service: Endoscopy;  Laterality: N/A;   ESOPHAGOGASTRODUODENOSCOPY (EGD) WITH PROPOFOL N/A 11/24/2018   Procedure: ESOPHAGOGASTRODUODENOSCOPY (EGD) WITH PROPOFOL;  Surgeon: Lin Landsman, MD;  Location: [facility] ENDOSCOPY;  Service: Gastroenterology;  Laterality: N/A;   Foot sugery Right    3rd toe from the right"    There were no vitals filed for this visit.    Subjective Assessment - 07/24/21 1120     Subjective Ashlee Richardson is a 65 year old female with c/o bilateral LBP with radiating symptoms down R LE. She reports having long standing back pain that limits her ability sit <> stand transfer, walk on treadmill, and perform light  housework. Patient also reports L sided neck pain that radiates to L arm. Pt reports her back pain also interfers with her sleep. She reports taking meloxicam to help reduce her LBP. 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 Patient says that physical therapy must happen prior to eligibility for low back surgery. Patient denies having cancer or any unexplained weigh loss. She reports having incontinence but that it is controlled by medication. She also reports having a fall within the last 6 months. Pt has a PMH of previous lumbar fusion L4-S1.    Pertinent History Ashlee Richardson is a 65 year old female with c/o bilateral LBP with radiating symptoms down R LE. She reports having long standing back pain that limits her ability sit <> stand transfer, walk on treadmill, and perform light housework. Patient also reports L sided neck pain that radiates to L arm. Pt reports her back pain also interfers with her sleep. She reports taking meloxicam to help reduce her LBP. 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 Patient says that physical therapy must happen prior to eligibility for low back surgery. Patient denies having cancer or any unexplained weigh loss. She reports having incontinence but that it is controlled by medication. She also  reports having a fall within the last 6 months. Pt has a PMH of previous lumbar fusion L4-S1.    Limitations Sitting;Lifting;Standing;Walking    How long can you sit comfortably? 30 min    How long can you stand comfortably? 30 min    How long can you walk comfortably? 2 miles    Patient Stated Goals To return to physical activity and gym exercises    Currently in Pain? Yes    Pain Score 8     Pain Location Back    Pain Orientation Lower;Right    Pain Descriptors / Indicators Aching    Pain Type Chronic pain     Pain Radiating Towards foot    Pain Onset More than a month ago    Pain Frequency Constant    Aggravating Factors  lifting, bending, changing position    Pain Relieving Factors standing walking    Effect of Pain on Daily Activities limits                Objective:     Lumbar AROM Flex: 70 deg Ext: 5 deg Lateral flexion: R greater than L limited by pain  Rotation L&R: [facility]   Thoracic Spine Rotation: WFL   Hip AROM/PROM:   R hip IR/ER limited 50% due to pain in R low back otherwise WFL  L Hip WFL  Special tests  FADIR/FADER (-) Slump Test (+)   Hip Strength MMT  Grossly in all planes 4-/5 R LE  4/5 L LE     Observations:   Posture: Forward head, rounded shoulders, decreased lumbar lordosis, R lateral shift   Gait: decreased step length R LE, increased bilat hip ER throughout cycle, L Trendelenburg, decreased arm swing bilat    Palpation: point tenderness to R QL and R PSIS   Test & Measures   5XSTS: 30 sec 10 MWT: 0.8 m/s    Therapeutic Exercise   Sit to Stands: 3 x 5 reps  Seated ABD 3 x 10 reps  Seated Fwd flexion 3 x 10 reps   PT reviewed the following HEP with patient with patient able to demonstrate a set of the following with min cuing for correction needed. PT educated patient on parameters of therex (how/when to inc/decrease intensity, frequency, rep/set range, stretch hold time, and purpose of therex) with verbalized understanding. Educated patient on position of comfort in flexed position to allow for increased activity tolerance, prompted to bring [facility] to next visit             Objective measurements completed on examination: See above findings.               PT Education - 07/24/21 1147     Education Details Pt educated on flexion based exercised improving LBP due to stenosis.    Person(s) Educated Patient    Methods Explanation;Demonstration    Comprehension Verbalized understanding;Returned demonstration               PT Short Term Goals - 07/24/21 1149       PT SHORT TERM GOAL #1   Title Pt will be independent with HEP in order to self-manage LBP.    Baseline 07/24/21    Time 3    Period Weeks    Status New               PT Long Term Goals - 07/24/21 1151       PT LONG TERM GOAL #1   Title Patient  will be demonstrate a decrease in worse pain from 10/10 to 7/10 in order to demonstrate clinically significant reduction in pain.    Baseline 07/24/21 10/10    Time 6    Period Weeks    Status New    Target Date 09/04/21      PT LONG TERM GOAL #2   Title Patient will demonstrate gross bilat hip strength of 4+/5 in order to improve sit<>stand transfer and steadiness with gait.    Baseline 07/24/21: R:4-/5 L: 4/5    Time 6    Period Weeks    Status New    Target Date 09/04/21      PT LONG TERM GOAL #3   Title Patient will be able to comfortably sit for 45 minutes to be able to perform occupational tasks.    Baseline 07/24/21: less than 30 min    Time 8    Period Weeks    Status New    Target Date 09/04/21      PT LONG TERM GOAL #4   Title Pt will perform 5XSTS within 20 seconds in order to demonstrate improved LE strength and decrease the likelihood of falling.    Baseline 81/22: 30 sec    Time 6    Period Weeks    Status New    Target Date 09/04/21                    Plan - 07/24/21 1235     Clinical Impression Statement Ashlee Richardson is a 16 female who presents with primary c/o chronic LBP with R LE radiating pain. Pt demonstrates decreased LE strength, abnormal posture, abnormal gait, decreased core stability, decreased activity tolerance, decreased balance, and pain. Activity limitations in lifting, cleaning, walking, and standing limits participation in activities of daily living and community ambulation. Pt will benefit from skilled PT in order to improve pain, functional mobilty, and decrease risk of falls.    Personal Factors and Comorbidities  Age;Comorbidity 3+;Profession    Comorbidities MS, prior surgery, chronic low back pain, chronic neck pain, fibromyalgia    Examination-Activity Limitations Bed Mobility;Squat;Lift;Stairs;Bend;Sit;Sleep    Examination-Participation Restrictions Occupation;Cleaning;Personal Finances;Yard Work;Community Activity    Stability/Clinical Decision Making Evolving/Moderate complexity    Clinical Decision Making Moderate    Rehab Potential Fair    PT Frequency 2x / week    PT Duration 6 weeks    PT Treatment/Interventions Therapeutic exercise;Therapeutic activities;Patient/family education;Passive range of motion;Manual techniques;Dry needling;Cryotherapy;Electrical Stimulation;Moist Heat;Gait training;Balance training;Neuromuscular re-education    PT Next Visit Plan Update; recheck HEP    PT Home Exercise Plan FWD flexion, sit to stands, seated hip abd    Consulted and Agree with Plan of Care Patient             Patient will benefit from skilled therapeutic intervention in order to improve the following deficits and impairments:  Pain, Hypomobility, Decreased strength, Decreased range of motion, Decreased endurance, Decreased activity tolerance, Increased edema, Abnormal gait, Impaired sensation, Improper body mechanics, Decreased mobility, Decreased balance, Decreased coordination  Visit Diagnosis: Chronic bilateral low back pain with bilateral sciatica     Problem List Patient Active Problem List   Diagnosis Date Noted   Type 2 diabetes mellitus with pressure callus ([place]) 03/20/2021   Diabetes mellitus type 2, diet-controlled ([place]) 03/20/2021   Onychomycosis of multiple toenails with type 2 diabetes mellitus ([place]) 03/20/2021   Chronic idiopathic constipation 03/20/2021   Lumbar foraminal stenosis 03/08/2021   Myalgia due to statin 05/26/2020  History of lumbar fusion 11/22/2019   GERD (gastroesophageal reflux disease) 04/28/2018   Pure hypercholesterolemia 04/28/2018    Diverticulosis 04/28/2018   RLS (restless legs syndrome) 04/28/2018   Angina pectoris ([place]) 04/28/2018   Vitamin D deficiency 12/27/2017   Chronic fatigue, unspecified 12/27/2017   Intractable migraine without aura and without status migrainosus 10/17/2015   DDD (degenerative disc disease), cervical 05/23/2015   Bilateral occipital neuralgia 05/23/2015   DDD (degenerative disc disease), lumbar 05/23/2015   Multiple sclerosis ([place]) 05/23/2015   Cervical disc disorder with radiculopathy of cervical region 04/20/2015   Right arm numbness 04/20/2015   Right arm weakness 04/20/2015    Ashlee Richardson DPT Ashlee Richardson, Ashlee Richardson  Ashlee Richardson 07/24/2021, 2:30 PM  [facility] [address] [place], [address] Phone: [phone]   Fax:  [phone]  Name: Ashlee Richardson MRN: [id number] Date of Birth: 05/29/1956

## 2021-07-26 ENCOUNTER — Ambulatory Visit: Payer: Medicare Other | Admitting: Physical Therapy

## 2021-07-26 DIAGNOSIS — M5442 Lumbago with sciatica, left side: Secondary | ICD-10-CM | POA: Diagnosis not present

## 2021-07-26 DIAGNOSIS — G8929 Other chronic pain: Secondary | ICD-10-CM

## 2021-07-26 DIAGNOSIS — M5441 Lumbago with sciatica, right side: Secondary | ICD-10-CM | POA: Diagnosis not present

## 2021-07-26 NOTE — Therapy (Signed)
[place] [facility] [address], [place], [address] Phone: [phone]   Fax:  [phone]  Physical Therapy Treatment  Patient Details  Name: Ashlee Richardson MRN: [id number] Date of Birth: [birth year] Referring Provider (PT): Lorin Mercy MD   Encounter Date: 07/26/2021   PT End of Session - 07/26/21 1208     Visit Number 2    Number of Visits 17    Date for PT Re-Evaluation 09/22/21    Authorization Type UHC Medicare    Authorization Time Period 03/14/21-06/06/21    PT Start Time 1032    PT Stop Time 1114    PT Time Calculation (min) 42 min    Activity Tolerance Patient tolerated treatment well;Patient limited by pain    Behavior During Therapy [facility] for tasks assessed/performed             Past Medical History:  Diagnosis Date   B12 deficiency 12/27/2017   Diverticulitis    Fibromyalgia    GERD (gastroesophageal reflux disease)    High cholesterol    Migraine    MS (multiple sclerosis) ([place])    PUD (peptic ulcer disease) 04/28/2018    Past Surgical History:  Procedure Laterality Date   [place]?   Pt usure of date   BREAST BIOPSY Right    COLONOSCOPY  09/2007   COLONOSCOPY WITH PROPOFOL N/A 08/28/2017   Procedure: COLONOSCOPY WITH PROPOFOL;  Surgeon: Christene Lye, MD;  Location: [facility] ENDOSCOPY;  Service: Endoscopy;  Laterality: N/A;   ESOPHAGOGASTRODUODENOSCOPY (EGD) WITH PROPOFOL N/A 11/24/2018   Procedure: ESOPHAGOGASTRODUODENOSCOPY (EGD) WITH PROPOFOL;  Surgeon: Lin Landsman, MD;  Location: [facility] ENDOSCOPY;  Service: Gastroenterology;  Laterality: N/A;   Foot sugery Right    3rd toe from the right"    There were no vitals filed for this visit.   Subjective Assessment - 07/26/21 1036     Subjective Pt reports her LBP today is 8/10 NPRS and the her HEP put some strain on her neck so she has to be careful on how she moves.    Pertinent History Ashlee Richardson is a 65 year old  female with c/o bilateral LBP with radiating symptoms down R LE. She reports having long standing back pain that limits her ability sit <> stand transfer, walk on treadmill, and perform light housework. Patient also reports L sided neck pain that radiates to L arm. Pt reports her back pain also interfers with her sleep. She reports taking meloxicam to help reduce her LBP. 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 Patient says that physical therapy must happen prior to eligibility for low back surgery. Patient denies having cancer or any unexplained weigh loss. She reports having incontinence but that it is controlled by medication. She also reports having a fall within the last 6 months. Pt has a PMH of previous lumbar fusion L4-S1.    Limitations Sitting;Lifting;Standing;Walking    How long can you sit comfortably? 30 min    How long can you stand comfortably? 30 min    How long can you walk comfortably? 2 miles    Patient Stated Goals To return to physical activity and gym exercises              Therapeutic Exercise  Nu Step L2 x 5 min for gentle LE strengthening   Seated FWD Flexion roll outs on physioball with L/R lateral flex  1 x 12 reps   Dead bird with physioball  1 x 6 reps each LE with cues to press into physio ball  Supine knee drops L<>R 1 x 10 reps with cues to briefly hold 1-2 sec at end range   Open Books 1 x 6 reps with 5 sec holds with cues to hold and turn head and trunk  Bent over Hip Extension 2 x 10 reps with cues to squeeze bottom at top and 2-3 sec hold at top  Matrix Standing Hip ABD 1 x 10 reps 25# each side cues to perform slowly                          PT Education - 07/26/21 1239     Education Details therex form/technique    Person(s) Educated Patient    Methods Explanation;Demonstration    Comprehension Verbalized understanding;Returned  demonstration              PT Short Term Goals - 07/24/21 1149       PT SHORT TERM GOAL #1   Title Pt will be independent with HEP in order to self-manage LBP.    Baseline 07/24/21    Time 3    Period Weeks    Status New               PT Long Term Goals - 07/24/21 1151       PT LONG TERM GOAL #1   Title Patient will be demonstrate a decrease in worse pain from 10/10 to 7/10 in order to demonstrate clinically significant reduction in pain.    Baseline 07/24/21 10/10    Time 6    Period Weeks    Status New    Target Date 09/04/21      PT LONG TERM GOAL #2   Title Patient will demonstrate gross bilat hip strength of 4+/5 in order to improve sit<>stand transfer and steadiness with gait.    Baseline 07/24/21: R:4-/5 L: 4/5    Time 6    Period Weeks    Status New    Target Date 09/04/21      PT LONG TERM GOAL #3   Title Patient will be able to comfortably sit for 45 minutes to be able to perform occupational tasks.    Baseline 07/24/21: less than 30 min    Time 8    Period Weeks    Status New    Target Date 09/04/21      PT LONG TERM GOAL #4   Title Pt will perform 5XSTS within 20 seconds in order to demonstrate improved LE strength and decrease the likelihood of falling.    Baseline 81/22: 30 sec    Time 6    Period Weeks    Status New    Target Date 09/04/21                   Plan - 07/26/21 1209     Clinical Impression Statement Pt tolerated session well evidenced by no increase in pain following therex. PT session focused on LE and core strengthening and reduction of L LBP. Continue PT POC as able. Pt will continue to benefit from PT to address impairments and achieve set goals.    Personal Factors and Comorbidities Age;Comorbidity 3+;Profession    Comorbidities MS, prior surgery, chronic low back pain, chronic neck pain, fibromyalgia    Examination-Activity Limitations Bed Mobility;Squat;Lift;Stairs;Bend;Sit;Sleep    Examination-Participation  Restrictions  Occupation;Cleaning;Personal Finances;Yard Work;Community Activity    Stability/Clinical Decision Making Evolving/Moderate complexity    Clinical Decision Making Moderate    Rehab Potential Fair    PT Frequency 2x / week    PT Duration 6 weeks    PT Treatment/Interventions Therapeutic exercise;Therapeutic activities;Patient/family education;Passive range of motion;Manual techniques;Dry needling;Cryotherapy;Electrical Stimulation;Moist Heat;Gait training;Balance training;Neuromuscular re-education    PT Next Visit Plan Update; recheck HEP    PT Home Exercise Plan FWD flexion, sit to stands, seated hip abd    Consulted and Agree with Plan of Care Patient             Patient will benefit from skilled therapeutic intervention in order to improve the following deficits and impairments:  Pain, Hypomobility, Decreased strength, Decreased range of motion, Decreased endurance, Decreased activity tolerance, Increased edema, Abnormal gait, Impaired sensation, Improper body mechanics, Decreased mobility, Decreased balance, Decreased coordination  Visit Diagnosis: Chronic bilateral low back pain with bilateral sciatica     Problem List Patient Active Problem List   Diagnosis Date Noted   Type 2 diabetes mellitus with pressure callus ([place]) 03/20/2021   Diabetes mellitus type 2, diet-controlled ([place]) 03/20/2021   Onychomycosis of multiple toenails with type 2 diabetes mellitus ([place]) 03/20/2021   Chronic idiopathic constipation 03/20/2021   Lumbar foraminal stenosis 03/08/2021   Myalgia due to statin 05/26/2020   History of lumbar fusion 11/22/2019   GERD (gastroesophageal reflux disease) 04/28/2018   Pure hypercholesterolemia 04/28/2018   Diverticulosis 04/28/2018   RLS (restless legs syndrome) 04/28/2018   Angina pectoris ([place]) 04/28/2018   Vitamin D deficiency 12/27/2017   Chronic fatigue, unspecified 12/27/2017   Intractable migraine without aura and without status  migrainosus 10/17/2015   DDD (degenerative disc disease), cervical 05/23/2015   Bilateral occipital neuralgia 05/23/2015   DDD (degenerative disc disease), lumbar 05/23/2015   Multiple sclerosis ([place]) 05/23/2015   Cervical disc disorder with radiculopathy of cervical region 04/20/2015   Right arm numbness 04/20/2015   Right arm weakness 04/20/2015    Durwin Reges DPT Sharion Settler, SPT  Durwin Reges 07/26/2021, 1:05 PM  [place] [facility] [address] [place], [address] Phone: [phone]   Fax:  [phone]  Name: Ashlee Richardson MRN: [id number] Date of Birth: 08-04-1956

## 2021-07-31 ENCOUNTER — Ambulatory Visit: Payer: Medicare Other | Admitting: Physical Therapy

## 2021-07-31 ENCOUNTER — Encounter: Payer: Self-pay | Admitting: Physical Therapy

## 2021-07-31 DIAGNOSIS — G8929 Other chronic pain: Secondary | ICD-10-CM | POA: Diagnosis not present

## 2021-07-31 DIAGNOSIS — M5441 Lumbago with sciatica, right side: Secondary | ICD-10-CM | POA: Diagnosis not present

## 2021-07-31 DIAGNOSIS — M5442 Lumbago with sciatica, left side: Secondary | ICD-10-CM | POA: Diagnosis not present

## 2021-07-31 NOTE — Therapy (Signed)
[place] [facility] [address] [place], [address] Phone: [phone]   Fax:  [phone]  Physical Therapy Treatment  Patient Details  Name: Ashlee Richardson MRN: [id number] Date of Birth: 1956-11-28 Referring Provider (PT): Lorin Mercy MD   Encounter Date: 07/31/2021   PT End of Session - 07/31/21 1110     Visit Number 3    Number of Visits 17    Date for PT Re-Evaluation 09/22/21    Authorization Type UHC Medicare    Authorization Time Period 03/14/21-06/06/21    PT Start Time 1033    PT Stop Time 1111    PT Time Calculation (min) 38 min    Equipment Utilized During Treatment Gait belt    Activity Tolerance Patient tolerated treatment well;Patient limited by pain    Behavior During Therapy [facility] for tasks assessed/performed             Past Medical History:  Diagnosis Date   B12 deficiency 12/27/2017   Diverticulitis    Fibromyalgia    GERD (gastroesophageal reflux disease)    High cholesterol    Migraine    MS (multiple sclerosis) ([place])    PUD (peptic ulcer disease) 04/28/2018    Past Surgical History:  Procedure Laterality Date   [place]?   Pt usure of date   BREAST BIOPSY Right    COLONOSCOPY  09/2007   COLONOSCOPY WITH PROPOFOL N/A 08/28/2017   Procedure: COLONOSCOPY WITH PROPOFOL;  Surgeon: Christene Lye, MD;  Location: [facility] ENDOSCOPY;  Service: Endoscopy;  Laterality: N/A;   ESOPHAGOGASTRODUODENOSCOPY (EGD) WITH PROPOFOL N/A 11/24/2018   Procedure: ESOPHAGOGASTRODUODENOSCOPY (EGD) WITH PROPOFOL;  Surgeon: Lin Landsman, MD;  Location: [facility] ENDOSCOPY;  Service: Gastroenterology;  Laterality: N/A;   Foot sugery Right    3rd toe from the right"    There were no vitals filed for this visit.   Subjective Assessment - 07/31/21 1035     Subjective Pt reports her back pain as 5/10 on NPRS today. She reports her L arm is feeling sore from the covid booster shot. She also states  she received another pain medication that seems to be working well.    Pertinent History Ashlee Richardson is a 65 year old female with c/o bilateral LBP with radiating symptoms down R LE. She reports having long standing back pain that limits her ability sit <> stand transfer, walk on treadmill, and perform light housework. Patient also reports L sided neck pain that radiates to L arm. Pt reports her back pain also interfers with her sleep. She reports taking meloxicam to help reduce her LBP. 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 Patient says that physical therapy must happen prior to eligibility for low back surgery. Patient denies having cancer or any unexplained weigh loss. She reports having incontinence but that it is controlled by medication. She also reports having a fall within the last 6 months. Pt has a PMH of previous lumbar fusion L4-S1.    Limitations Sitting;Lifting;Standing;Walking    How long can you sit comfortably? 30 min    How long can you stand comfortably? 30 min    How long can you walk comfortably? 2 miles             Therex  Nu Step L3 x 5 min for gentle LE strengthening   Modified bird dog 1 x 6 reps with 5  sec holds cueing to initiate    Seated FWD Flexion roll outs on physioball with L/R lateral flex 3 x 5 reps  Glute bridges 1 x 8 reps with 5 sec hold. Cueing to initiate with good carry over   Supine knee drops L<>R 1 x 8 reps with cues to briefly hold 3-5 sec hold at end range    Matrix Standing Hip ABD 1 x 10 reps 40# each side cues to perform slowly   STS 1 x 5 reps mat table lowest position                         PT Education - 07/31/21 1258     Education Details Pt educated on therex form/technique    Person(s) Educated Patient    Methods Explanation;Demonstration    Comprehension Verbalized understanding;Returned demonstration               PT Short Term Goals - 07/24/21 1149       PT SHORT TERM GOAL #1   Title Pt will be independent with HEP in order to self-manage LBP.    Baseline 07/24/21    Time 3    Period Weeks    Status New               PT Long Term Goals - 07/24/21 1151       PT LONG TERM GOAL #1   Title Patient will be demonstrate a decrease in worse pain from 10/10 to 7/10 in order to demonstrate clinically significant reduction in pain.    Baseline 07/24/21 10/10    Time 6    Period Weeks    Status New    Target Date 09/04/21      PT LONG TERM GOAL #2   Title Patient will demonstrate gross bilat hip strength of 4+/5 in order to improve sit<>stand transfer and steadiness with gait.    Baseline 07/24/21: R:4-/5 L: 4/5    Time 6    Period Weeks    Status New    Target Date 09/04/21      PT LONG TERM GOAL #3   Title Patient will be able to comfortably sit for 45 minutes to be able to perform occupational tasks.    Baseline 07/24/21: less than 30 min    Time 8    Period Weeks    Status New    Target Date 09/04/21      PT LONG TERM GOAL #4   Title Pt will perform 5XSTS within 20 seconds in order to demonstrate improved LE strength and decrease the likelihood of falling.    Baseline 81/22: 30 sec    Time 6    Period Weeks    Status New    Target Date 09/04/21                   Plan - 07/31/21 1630     Clinical Impression Statement PT progressed LE strengthening and core stability exercises this session. Pt tolerated session well without reporting increased pain with exercise but did have some reports of dizziness attributed to newly prescribed medication. Pt will continue to benefit from PT to ensure safe, effective exercise progression.    Personal Factors and Comorbidities Age;Comorbidity 3+;Profession    Comorbidities MS, prior surgery, chronic low back pain, chronic neck pain, fibromyalgia    Examination-Activity Limitations Bed Mobility;Squat;Lift;Stairs;Bend;Sit;Sleep     Examination-Participation Restrictions Occupation;Cleaning;Personal Finances;Yard Work;Community Activity    Stability/Clinical  Decision Making Evolving/Moderate complexity    Clinical Decision Making Moderate    Rehab Potential Fair    PT Frequency 2x / week    PT Duration 6 weeks    PT Treatment/Interventions Therapeutic exercise;Therapeutic activities;Patient/family education;Passive range of motion;Manual techniques;Dry needling;Cryotherapy;Electrical Stimulation;Moist Heat;Gait training;Balance training;Neuromuscular re-education    PT Next Visit Plan Update; recheck HEP    PT Home Exercise Plan FWD flexion, sit to stands, seated hip abd    Consulted and Agree with Plan of Care Patient             Patient will benefit from skilled therapeutic intervention in order to improve the following deficits and impairments:  Pain, Hypomobility, Decreased strength, Decreased range of motion, Decreased endurance, Decreased activity tolerance, Increased edema, Abnormal gait, Impaired sensation, Improper body mechanics, Decreased mobility, Decreased balance, Decreased coordination  Visit Diagnosis: Chronic bilateral low back pain with bilateral sciatica     Problem List Patient Active Problem List   Diagnosis Date Noted   Type 2 diabetes mellitus with pressure callus ([place]) 03/20/2021   Diabetes mellitus type 2, diet-controlled ([place]) 03/20/2021   Onychomycosis of multiple toenails with type 2 diabetes mellitus ([place]) 03/20/2021   Chronic idiopathic constipation 03/20/2021   Lumbar foraminal stenosis 03/08/2021   Myalgia due to statin 05/26/2020   History of lumbar fusion 11/22/2019   GERD (gastroesophageal reflux disease) 04/28/2018   Pure hypercholesterolemia 04/28/2018   Diverticulosis 04/28/2018   RLS (restless legs syndrome) 04/28/2018   Angina pectoris ([place]) 04/28/2018   Vitamin D deficiency 12/27/2017   Chronic fatigue, unspecified 12/27/2017   Intractable migraine without aura  and without status migrainosus 10/17/2015   DDD (degenerative disc disease), cervical 05/23/2015   Bilateral occipital neuralgia 05/23/2015   DDD (degenerative disc disease), lumbar 05/23/2015   Multiple sclerosis ([employer]) 05/23/2015   Cervical disc disorder with radiculopathy of cervical region 04/20/2015   Right arm numbness 04/20/2015   Right arm weakness 04/20/2015    Durwin Reges DPT Sharion Settler, SPT  Durwin Reges 07/31/2021, 4:58 PM  [place] [facility] [address] [place], [address] Phone: [phone]   Fax:  [phone]  Name: Ashlee Richardson MRN: [id number] Date of Birth: Oct 27, 1956

## 2021-08-02 ENCOUNTER — Encounter: Payer: Self-pay | Admitting: Physical Therapy

## 2021-08-02 ENCOUNTER — Ambulatory Visit: Payer: Medicare Other | Admitting: Physical Therapy

## 2021-08-02 DIAGNOSIS — G8929 Other chronic pain: Secondary | ICD-10-CM

## 2021-08-02 DIAGNOSIS — M5442 Lumbago with sciatica, left side: Secondary | ICD-10-CM | POA: Diagnosis not present

## 2021-08-02 DIAGNOSIS — M5441 Lumbago with sciatica, right side: Secondary | ICD-10-CM | POA: Diagnosis not present

## 2021-08-02 NOTE — Therapy (Signed)
[facility] [address], [place], [address] Phone: [phone]   Fax:  [phone]  Physical Therapy Treatment  Patient Details  Name: Ashlee Richardson MRN: [id number] Date of Birth: 01-29-1956 Referring Provider (PT): Lorin Mercy MD   Encounter Date: 08/02/2021   PT End of Session - 08/02/21 1106     Visit Number 4    Number of Visits 17    Date for PT Re-Evaluation 09/22/21    Authorization Type UHC Medicare    PT Richardson Time 1029    PT Stop Time 1109    PT Time Calculation (min) 40 min    Activity Tolerance Patient tolerated treatment well    Behavior During Therapy [facility] for tasks assessed/performed             Past Medical History:  Diagnosis Date   B12 deficiency 12/27/2017   Diverticulitis    Fibromyalgia    GERD (gastroesophageal reflux disease)    High cholesterol    Migraine    MS (multiple sclerosis) ([place])    PUD (peptic ulcer disease) 04/28/2018    Past Surgical History:  Procedure Laterality Date   [place]?   Pt usure of date   BREAST BIOPSY Right    COLONOSCOPY  09/2007   COLONOSCOPY WITH PROPOFOL N/A 08/28/2017   Procedure: COLONOSCOPY WITH PROPOFOL;  Surgeon: Christene Lye, MD;  Location: [facility] ENDOSCOPY;  Service: Endoscopy;  Laterality: N/A;   ESOPHAGOGASTRODUODENOSCOPY (EGD) WITH PROPOFOL N/A 11/24/2018   Procedure: ESOPHAGOGASTRODUODENOSCOPY (EGD) WITH PROPOFOL;  Surgeon: Lin Landsman, MD;  Location: [facility] ENDOSCOPY;  Service: Gastroenterology;  Laterality: N/A;   Foot sugery Right    3rd toe from the right"    There were no vitals filed for this visit.   Subjective Assessment - 08/02/21 1031     Subjective Pt reports her back pain is 8/10 today due to not taking the new medication that makes her dizzy and sick to her stomach. She reports having a catch in her left hip.    Pertinent History Ashlee Richardson is a 65 year old female with c/o bilateral LBP  with radiating symptoms down R LE. She reports having long standing back pain that limits her ability sit <> stand transfer, walk on treadmill, and perform light housework. Patient also reports L sided neck pain that radiates to L arm. Pt reports her back pain also interfers with her sleep. She reports taking meloxicam to help reduce her LBP. 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 Patient says that physical therapy must happen prior to eligibility for low back surgery. Patient denies having cancer or any unexplained weigh loss. She reports having incontinence but that it is controlled by medication. She also reports having a fall within the last 6 months. Pt has a PMH of previous lumbar fusion L4-S1.    Limitations Sitting;Lifting;Standing;Walking    How long can you sit comfortably? 30 min    How long can you stand comfortably? 30 min    How long can you walk comfortably? 2 miles    Patient Stated Goals To return to physical activity and gym exercises            Therapeutic Exercise  Nu Step L3 x 5 min for gentle LE strengthening    Modified bird dog 1 x 8 reps with 5 sec holds cueing to initiate   Dead  bug 1 x 6 reps with 5 sec hold and cueing to lower extend ed   Standing Marches 1 x 60 RTB above knee   Seated FWD Flexion roll outs on physioball with L/R lateral flex 3 x 5 reps   Omega Leg Press 1 x 10 reps #45; 1 x 10 reps #55; 1 x10 reps 65# for LE Strengthening an repeated flexion  Lower trunk rotation1 x 8 reps with cues to briefly hold 3-5 sec hold at end range    Matrix Standing Hip ABD 1 x 10 reps 40# each side cues to perform slowly    STS 1 x 5 reps mat table lowest position                            PT Education - 08/02/21 1113     Education Details therex form/tecnniquw    Person(s) Educated Patient    Methods Explanation;Demonstration    Comprehension  Verbalized understanding;Returned demonstration              PT Short Term Goals - 07/24/21 1149       PT SHORT TERM GOAL #1   Title Pt will be independent with HEP in order to self-manage LBP.    Baseline 07/24/21    Time 3    Period Weeks    Status New               PT Long Term Goals - 07/24/21 1151       PT LONG TERM GOAL #1   Title Patient will be demonstrate a decrease in worse pain from 10/10 to 7/10 in order to demonstrate clinically significant reduction in pain.    Baseline 07/24/21 10/10    Time 6    Period Weeks    Status New    Target Date 09/04/21      PT LONG TERM GOAL #2   Title Patient will demonstrate gross bilat hip strength of 4+/5 in order to improve sit<>stand transfer and steadiness with gait.    Baseline 07/24/21: R:4-/5 L: 4/5    Time 6    Period Weeks    Status New    Target Date 09/04/21      PT LONG TERM GOAL #3   Title Patient will be able to comfortably sit for 45 minutes to be able to perform occupational tasks.    Baseline 07/24/21: less than 30 min    Time 8    Period Weeks    Status New    Target Date 09/04/21      PT LONG TERM GOAL #4   Title Pt will perform 5XSTS within 20 seconds in order to demonstrate improved LE strength and decrease the likelihood of falling.    Baseline 81/22: 30 sec    Time 6    Period Weeks    Status New    Target Date 09/04/21                   Plan - 08/02/21 1110     Clinical Impression Statement Patient tolerated session well evidenced by reports of decrease in pain following therapeutic exercise. PT session  focused primarily on core and LE strengthening. Patient will continue to benefit from skilled PT to ensure safe and effective exercise progression to achieve set goals.    Personal Factors and Comorbidities Age;Comorbidity 3+;Profession    Comorbidities MS, prior surgery, chronic low back pain, chronic neck pain,  fibromyalgia    Examination-Activity Limitations Bed  Mobility;Squat;Lift;Stairs;Bend;Sit;Sleep    Examination-Participation Restrictions Occupation;Cleaning;Personal Finances;Yard Work;Community Activity    Stability/Clinical Decision Making Evolving/Moderate complexity    Clinical Decision Making Moderate    Rehab Potential Fair    PT Frequency 2x / week    PT Duration 6 weeks    PT Treatment/Interventions Therapeutic exercise;Therapeutic activities;Patient/family education;Passive range of motion;Manual techniques;Dry needling;Cryotherapy;Electrical Stimulation;Moist Heat;Gait training;Balance training;Neuromuscular re-education    PT Next Visit Plan Update; recheck HEP    PT Home Exercise Plan FWD flexion, sit to stands, seated hip abd    Consulted and Agree with Plan of Care Patient             Patient will benefit from skilled therapeutic intervention in order to improve the following deficits and impairments:  Pain, Hypomobility, Decreased strength, Decreased range of motion, Decreased endurance, Decreased activity tolerance, Increased edema, Abnormal gait, Impaired sensation, Improper body mechanics, Decreased mobility, Decreased balance, Decreased coordination  Visit Diagnosis: Chronic bilateral low back pain with bilateral sciatica     Problem List Patient Active Problem List   Diagnosis Date Noted   Type 2 diabetes mellitus with pressure callus ([place]) 03/20/2021   Diabetes mellitus type 2, diet-controlled ([place]) 03/20/2021   Onychomycosis of multiple toenails with type 2 diabetes mellitus ([place]) 03/20/2021   Chronic idiopathic constipation 03/20/2021   Lumbar foraminal stenosis 03/08/2021   Myalgia due to statin 05/26/2020   History of lumbar fusion 11/22/2019   GERD (gastroesophageal reflux disease) 04/28/2018   Pure hypercholesterolemia 04/28/2018   Diverticulosis 04/28/2018   RLS (restless legs syndrome) 04/28/2018   Angina pectoris ([place]) 04/28/2018   Vitamin D deficiency 12/27/2017   Chronic fatigue, unspecified  12/27/2017   Intractable migraine without aura and without status migrainosus 10/17/2015   DDD (degenerative disc disease), cervical 05/23/2015   Bilateral occipital neuralgia 05/23/2015   DDD (degenerative disc disease), lumbar 05/23/2015   Multiple sclerosis ([place]) 05/23/2015   Cervical disc disorder with radiculopathy of cervical region 04/20/2015   Right arm numbness 04/20/2015   Right arm weakness 04/20/2015     Durwin Reges DPT Sharion Settler, SPT  Durwin Reges 08/03/2021, 8:12 AM  [facility] [address] [place], [address] Phone: [phone]   Fax:  [phone]  Name: Ashlee Richardson MRN: [id number] Date of Birth: 05/03/1956

## 2021-08-07 ENCOUNTER — Ambulatory Visit: Payer: Medicare Other | Admitting: Physical Therapy

## 2021-08-07 ENCOUNTER — Encounter: Payer: Self-pay | Admitting: Physical Therapy

## 2021-08-07 DIAGNOSIS — M5442 Lumbago with sciatica, left side: Secondary | ICD-10-CM | POA: Diagnosis not present

## 2021-08-07 DIAGNOSIS — M5441 Lumbago with sciatica, right side: Secondary | ICD-10-CM

## 2021-08-07 DIAGNOSIS — G8929 Other chronic pain: Secondary | ICD-10-CM | POA: Diagnosis not present

## 2021-08-07 NOTE — Therapy (Signed)
[facility] [address], [place], [address] Phone: [phone]   Fax:  [phone]  Physical Therapy Treatment  Patient Details  Name: Ashlee Richardson MRN: [id number] Date of Birth: Aug 02, 1956 Referring Provider (PT): Lorin Mercy MD   Encounter Date: 08/07/2021   PT End of Session - 08/07/21 1039     Visit Number 5    Number of Visits 17    Date for PT Re-Evaluation 09/22/21    Authorization Type UHC Medicare    PT Start Time 1032    PT Stop Time 1112    PT Time Calculation (min) 40 min    Activity Tolerance Patient tolerated treatment well    Behavior During Therapy [facility] for tasks assessed/performed             Past Medical History:  Diagnosis Date   B12 deficiency 12/27/2017   Diverticulitis    Fibromyalgia    GERD (gastroesophageal reflux disease)    High cholesterol    Migraine    MS (multiple sclerosis) ([place])    PUD (peptic ulcer disease) 04/28/2018    Past Surgical History:  Procedure Laterality Date   [place]?   Pt usure of date   BREAST BIOPSY Right    COLONOSCOPY  09/2007   COLONOSCOPY WITH PROPOFOL N/A 08/28/2017   Procedure: COLONOSCOPY WITH PROPOFOL;  Surgeon: Christene Lye, MD;  Location: [facility] ENDOSCOPY;  Service: Endoscopy;  Laterality: N/A;   ESOPHAGOGASTRODUODENOSCOPY (EGD) WITH PROPOFOL N/A 11/24/2018   Procedure: ESOPHAGOGASTRODUODENOSCOPY (EGD) WITH PROPOFOL;  Surgeon: Lin Landsman, MD;  Location: [facility] ENDOSCOPY;  Service: Gastroenterology;  Laterality: N/A;   Foot sugery Right    3rd toe from the right"    There were no vitals filed for this visit.   Subjective Assessment - 08/07/21 1034     Subjective Pt reports LBP as 5/10 on NPRS today. She says she feels much better than she did over the weekend. She reports still having a "catch" in her left hip.    Pertinent History Ashlee Richardson is a 65 year old female with c/o bilateral LBP with  radiating symptoms down R LE. She reports having long standing back pain that limits her ability sit <> stand transfer, walk on treadmill, and perform light housework. Patient also reports L sided neck pain that radiates to L arm. Pt reports her back pain also interfers with her sleep. She reports taking meloxicam to help reduce her LBP. 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 Patient says that physical therapy must happen prior to eligibility for low back surgery. Patient denies having cancer or any unexplained weigh loss. She reports having incontinence but that it is controlled by medication. She also reports having a fall within the last 6 months. Pt has a PMH of previous lumbar fusion L4-S1.    Limitations Sitting;Lifting;Standing;Walking    How long can you sit comfortably? 30 min    How long can you stand comfortably? 30 min    How long can you walk comfortably? 2 miles    Patient Stated Goals To return to physical activity and gym exercises             Therex  Nu Step L3 x 5 min LE only for gentle pain free strengthening  Glute Stretch bilat LE 1 x 60 sec with tactile cueing for initial set up  Bilat knees to  chest 2 x 60 sec  Lower trunk rotation 2 x 30 sec  Supine Dead Bug with physioball 1 x 5 reps with 10 sec holds cueing to press into ball and activate abdominals  STS 1 x 5 reps with UE use; 1 x 5 reps without UE use @ 21in height mat table  Modified Bird dog 1 x 3 reps with 5 sec hold    Treadmill Walking 3MPH x 5 min on incline elevated from 1-2 to increase lumbar flexion bias to decrease LBP and increase LE and cardiovascular endurance    * Pt reported decreased LBP with elevation in treadmill incline.                      PT Education - 08/07/21 1119     Education Details Pt educated on incline treadmill walking relieveing LBP.    Person(s) Educated Patient     Methods Explanation;Demonstration    Comprehension Verbalized understanding;Returned demonstration              PT Short Term Goals - 07/24/21 1149       PT SHORT TERM GOAL #1   Title Pt will be independent with HEP in order to self-manage LBP.    Baseline 07/24/21    Time 3    Period Weeks    Status New               PT Long Term Goals - 07/24/21 1151       PT LONG TERM GOAL #1   Title Patient will be demonstrate a decrease in worse pain from 10/10 to 7/10 in order to demonstrate clinically significant reduction in pain.    Baseline 07/24/21 10/10    Time 6    Period Weeks    Status New    Target Date 09/04/21      PT LONG TERM GOAL #2   Title Patient will demonstrate gross bilat hip strength of 4+/5 in order to improve sit<>stand transfer and steadiness with gait.    Baseline 07/24/21: R:4-/5 L: 4/5    Time 6    Period Weeks    Status New    Target Date 09/04/21      PT LONG TERM GOAL #3   Title Patient will be able to comfortably sit for 45 minutes to be able to perform occupational tasks.    Baseline 07/24/21: less than 30 min    Time 8    Period Weeks    Status New    Target Date 09/04/21      PT LONG TERM GOAL #4   Title Pt will perform 5XSTS within 20 seconds in order to demonstrate improved LE strength and decrease the likelihood of falling.    Baseline 81/22: 30 sec    Time 6    Period Weeks    Status New    Target Date 09/04/21                   Plan - 08/07/21 1117     Clinical Impression Statement Patient tolerated session well evidenced by reports of decrease in pain following therapeutic exercise. PT session  focused on core, LE, and cardiovascular strengthening. Patient will continue to benefit from skilled PT to ensure safe and effective exercise progression to achieve set goals.    Personal Factors and Comorbidities Age;Comorbidity 3+;Profession    Comorbidities MS, prior surgery, chronic low back pain, chronic neck pain,  fibromyalgia    Examination-Activity  Limitations Bed Mobility;Squat;Lift;Stairs;Bend;Sit;Sleep    Examination-Participation Restrictions Occupation;Cleaning;Personal Finances;Yard Work;Community Activity    Stability/Clinical Decision Making Evolving/Moderate complexity    Rehab Potential Fair    PT Frequency 2x / week    PT Duration 6 weeks    PT Treatment/Interventions Therapeutic exercise;Therapeutic activities;Patient/family education;Passive range of motion;Manual techniques;Dry needling;Cryotherapy;Electrical Stimulation;Moist Heat;Gait training;Balance training;Neuromuscular re-education    PT Next Visit Plan Update; recheck HEP    PT Home Exercise Plan FWD flexion, sit to stands, seated hip abd    Consulted and Agree with Plan of Care Patient             Patient will benefit from skilled therapeutic intervention in order to improve the following deficits and impairments:  Pain, Hypomobility, Decreased strength, Decreased range of motion, Decreased endurance, Decreased activity tolerance, Increased edema, Abnormal gait, Impaired sensation, Improper body mechanics, Decreased mobility, Decreased balance, Decreased coordination  Visit Diagnosis: Chronic bilateral low back pain with bilateral sciatica     Problem List Patient Active Problem List   Diagnosis Date Noted   Type 2 diabetes mellitus with pressure callus ([place]) 03/20/2021   Diabetes mellitus type 2, diet-controlled ([place]) 03/20/2021   Onychomycosis of multiple toenails with type 2 diabetes mellitus ([place]) 03/20/2021   Chronic idiopathic constipation 03/20/2021   Lumbar foraminal stenosis 03/08/2021   Myalgia due to statin 05/26/2020   History of lumbar fusion 11/22/2019   GERD (gastroesophageal reflux disease) 04/28/2018   Pure hypercholesterolemia 04/28/2018   Diverticulosis 04/28/2018   RLS (restless legs syndrome) 04/28/2018   Angina pectoris ([place]) 04/28/2018   Vitamin D deficiency 12/27/2017   Chronic fatigue,  unspecified 12/27/2017   Intractable migraine without aura and without status migrainosus 10/17/2015   DDD (degenerative disc disease), cervical 05/23/2015   Bilateral occipital neuralgia 05/23/2015   DDD (degenerative disc disease), lumbar 05/23/2015   Multiple sclerosis ([place]) 05/23/2015   Cervical disc disorder with radiculopathy of cervical region 04/20/2015   Right arm numbness 04/20/2015   Right arm weakness 04/20/2015     Durwin Reges DPT Sharion Settler, SPT  Durwin Reges 08/07/2021, 4:10 PM  [facility] [address] [place], [address] Phone: [phone]   Fax:  [phone]  Name: MAKISHA MESKER MRN: [id number] Date of Birth: 1956-12-24

## 2021-08-09 ENCOUNTER — Ambulatory Visit: Payer: Medicare Other | Admitting: Physical Therapy

## 2021-08-14 ENCOUNTER — Encounter: Payer: Self-pay | Admitting: Physical Therapy

## 2021-08-14 ENCOUNTER — Ambulatory Visit: Payer: Medicare Other | Admitting: Physical Therapy

## 2021-08-14 DIAGNOSIS — G8929 Other chronic pain: Secondary | ICD-10-CM

## 2021-08-14 DIAGNOSIS — M5442 Lumbago with sciatica, left side: Secondary | ICD-10-CM

## 2021-08-14 DIAGNOSIS — M5441 Lumbago with sciatica, right side: Secondary | ICD-10-CM | POA: Diagnosis not present

## 2021-08-14 NOTE — Therapy (Signed)
[facility] [address], [place], [address] Phone: [phone]   Fax:  [phone]  Physical Therapy Treatment  Patient Details  Name: Ashlee Richardson MRN: [id number] Date of Birth: [birth year] Referring Provider (PT): Lorin Mercy MD   Encounter Date: 08/14/2021   PT End of Session - 08/14/21 1121     Visit Number 6    Number of Visits 17    Date for PT Re-Evaluation 09/22/21    Authorization Type UHC Medicare    PT Start Time 1029    PT Stop Time 1114    PT Time Calculation (min) 45 min    Activity Tolerance Patient tolerated treatment well    Behavior During Therapy [facility] for tasks assessed/performed             Past Medical History:  Diagnosis Date   B12 deficiency 12/27/2017   Diverticulitis    Fibromyalgia    GERD (gastroesophageal reflux disease)    High cholesterol    Migraine    MS (multiple sclerosis) ([place])    PUD (peptic ulcer disease) 04/28/2018    Past Surgical History:  Procedure Laterality Date   [place]?   Pt usure of date   BREAST BIOPSY Right    COLONOSCOPY  09/2007   COLONOSCOPY WITH PROPOFOL N/A 08/28/2017   Procedure: COLONOSCOPY WITH PROPOFOL;  Surgeon: Christene Lye, MD;  Location: [facility] ENDOSCOPY;  Service: Endoscopy;  Laterality: N/A;   ESOPHAGOGASTRODUODENOSCOPY (EGD) WITH PROPOFOL N/A 11/24/2018   Procedure: ESOPHAGOGASTRODUODENOSCOPY (EGD) WITH PROPOFOL;  Surgeon: Lin Landsman, MD;  Location: [facility] ENDOSCOPY;  Service: Gastroenterology;  Laterality: N/A;   Foot sugery Right    3rd toe from the right"    There were no vitals filed for this visit.   Subjective Assessment - 08/14/21 1032     Subjective Pt reports LBP less than a 5/10 on NPRS today. She says she took a pain pill last night and feels well this morning.    Pertinent History Ashlee Richardson is a 65 year old female with c/o bilateral LBP with radiating symptoms down R LE. She reports  having long standing back pain that limits her ability sit <> stand transfer, walk on treadmill, and perform light housework. Patient also reports L sided neck pain that radiates to L arm. Pt reports her back pain also interfers with her sleep. She reports taking meloxicam to help reduce her LBP. 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 Patient says that physical therapy must happen prior to eligibility for low back surgery. Patient denies having cancer or any unexplained weigh loss. She reports having incontinence but that it is controlled by medication. She also reports having a fall within the last 6 months. Pt has a PMH of previous lumbar fusion L4-S1.    Limitations Sitting;Lifting;Standing;Walking    How long can you sit comfortably? 30 min    How long can you stand comfortably? 30 min    How long can you walk comfortably? 2 miles    Patient Stated Goals To return to physical activity and gym exercises             Therex   Treadmill Walking 3.5 MPH x 5 min on incline elevated from 1-2 to increase lumbar flexion bias to decrease LBP and increase LE and cardiovascular endurance  Total Gym Squats 2 x 10 reps level 26 with cues  to go lower for depth and increased strengthening   Monster walks 4 x 69f RTB around ankles cues to keep resistance in band and keep feet straight; HHA to ensure safety/balance   STS 1 x 10 reps without UE use from green chair   Modified Bird dog 1 x 8 reps with 5 sec hold    OMEGA Leg Extension 3 x 5 reps 45# with initial set up   OMEGA Leg Curl 2 x 7-8 reps #35  Seated FWD/Lateral Physioball Rollouts 1 x 10 reps each  Supine Glute stretch 2 x 30 sec bilat                             PT Education - 08/14/21 1120     Education Details therex form/technique    Person(s) Educated Patient    Methods Explanation;Demonstration    Comprehension  Verbalized understanding;Returned demonstration              PT Short Term Goals - 07/24/21 1149       PT SHORT TERM GOAL #1   Title Pt will be independent with HEP in order to self-manage LBP.    Baseline 07/24/21    Time 3    Period Weeks    Status New               PT Long Term Goals - 07/24/21 1151       PT LONG TERM GOAL #1   Title Patient will be demonstrate a decrease in worse pain from 10/10 to 7/10 in order to demonstrate clinically significant reduction in pain.    Baseline 07/24/21 10/10    Time 6    Period Weeks    Status New    Target Date 09/04/21      PT LONG TERM GOAL #2   Title Patient will demonstrate gross bilat hip strength of 4+/5 in order to improve sit<>stand transfer and steadiness with gait.    Baseline 07/24/21: R:4-/5 L: 4/5    Time 6    Period Weeks    Status New    Target Date 09/04/21      PT LONG TERM GOAL #3   Title Patient will be able to comfortably sit for 45 minutes to be able to perform occupational tasks.    Baseline 07/24/21: less than 30 min    Time 8    Period Weeks    Status New    Target Date 09/04/21      PT LONG TERM GOAL #4   Title Pt will perform 5XSTS within 20 seconds in order to demonstrate improved LE strength and decrease the likelihood of falling.    Baseline 81/22: 30 sec    Time 6    Period Weeks    Status New    Target Date 09/04/21                   Plan - 08/14/21 1122     Clinical Impression Statement Patient tolerated session well evidenced by no increase in pain following therapeutic exercise. PT focused on core and LE strengthening, as well as cardiovascular endurance. Patient will continue to benefit from skilled PT to ensure safe and effective exercise progression to achieve set goals.    Personal Factors and Comorbidities Age;Comorbidity 3+;Profession    Comorbidities MS, prior surgery, chronic low back pain, chronic neck pain, fibromyalgia    Examination-Activity Limitations Bed  Mobility;Squat;Lift;Stairs;Bend;Sit;Sleep  Examination-Participation Restrictions Occupation;Cleaning;Personal Finances;Yard Work;Community Activity    Stability/Clinical Decision Making Evolving/Moderate complexity    Clinical Decision Making Moderate    Rehab Potential Fair    PT Frequency 2x / week    PT Duration 6 weeks    PT Treatment/Interventions Therapeutic exercise;Therapeutic activities;Patient/family education;Passive range of motion;Manual techniques;Dry needling;Cryotherapy;Electrical Stimulation;Moist Heat;Gait training;Balance training;Neuromuscular re-education    PT Next Visit Plan Update; recheck HEP    PT Home Exercise Plan FWD flexion, sit to stands, seated hip abd    Consulted and Agree with Plan of Care Patient             Patient will benefit from skilled therapeutic intervention in order to improve the following deficits and impairments:  Pain, Hypomobility, Decreased strength, Decreased range of motion, Decreased endurance, Decreased activity tolerance, Increased edema, Abnormal gait, Impaired sensation, Improper body mechanics, Decreased mobility, Decreased balance, Decreased coordination  Visit Diagnosis: Chronic bilateral low back pain with bilateral sciatica     Problem List Patient Active Problem List   Diagnosis Date Noted   Type 2 diabetes mellitus with pressure callus ([place]) 03/20/2021   Diabetes mellitus type 2, diet-controlled ([place]) 03/20/2021   Onychomycosis of multiple toenails with type 2 diabetes mellitus ([place]) 03/20/2021   Chronic idiopathic constipation 03/20/2021   Lumbar foraminal stenosis 03/08/2021   Myalgia due to statin 05/26/2020   History of lumbar fusion 11/22/2019   GERD (gastroesophageal reflux disease) 04/28/2018   Pure hypercholesterolemia 04/28/2018   Diverticulosis 04/28/2018   RLS (restless legs syndrome) 04/28/2018   Angina pectoris ([place]) 04/28/2018   Vitamin D deficiency 12/27/2017   Chronic fatigue, unspecified  12/27/2017   Intractable migraine without aura and without status migrainosus 10/17/2015   DDD (degenerative disc disease), cervical 05/23/2015   Bilateral occipital neuralgia 05/23/2015   DDD (degenerative disc disease), lumbar 05/23/2015   Multiple sclerosis ([place]) 05/23/2015   Cervical disc disorder with radiculopathy of cervical region 04/20/2015   Right arm numbness 04/20/2015   Right arm weakness 04/20/2015    Durwin Reges DPT Sharion Settler, SPT  Durwin Reges 08/14/2021, 3:01 PM  [facility] [address] [place], [address] Phone: [phone]   Fax:  [phone]  Name: Ashlee Richardson MRN: [id number] Date of Birth: 03-25-1956

## 2021-08-15 ENCOUNTER — Ambulatory Visit (INDEPENDENT_AMBULATORY_CARE_PROVIDER_SITE_OTHER): Payer: Medicare Other

## 2021-08-15 ENCOUNTER — Other Ambulatory Visit: Payer: Self-pay

## 2021-08-15 VITALS — BP 138/72 | HR 72 | Temp 98.2°F | Resp 16 | Ht 68.0 in | Wt 181.4 lb

## 2021-08-15 DIAGNOSIS — Z0000 Encounter for general adult medical examination without abnormal findings: Secondary | ICD-10-CM

## 2021-08-15 NOTE — Progress Notes (Signed)
Subjective:   Ashlee Richardson is a 65 y.o. female who presents for Medicare Annual (Subsequent) preventive examination.  Review of Systems     Cardiac Risk Factors include: dyslipidemia     Objective:    Today's Vitals   08/15/21 1359 08/15/21 1410  BP: 138/72   Pulse: 72   Resp: 16   Temp: 98.2 F (36.8 C)   TempSrc: Oral   SpO2: 99%   Weight: 181 lb 6.4 oz (82.3 kg)   Height: '5\' 8"'$  (1.727 m)   PainSc:  8    Body mass index is 27.58 kg/m.  Advanced Directives 08/15/2021 07/24/2021 03/01/2021 12/22/2020 08/11/2020 05/31/2020 01/13/2020  Does Patient Have a Medical Advance Directive? No No No No No No No  Would patient like information on creating a medical advance directive? No - Patient declined No - Patient declined - - No - Patient declined - No - Patient declined    Current Medications (verified) Outpatient Encounter Medications as of 08/15/2021  Medication Sig   Calcium-Magnesium-Vitamin D 300-150-400 MG-MG-UNIT TABS Take by mouth.   cholecalciferol (VITAMIN D3) 25 MCG (1000 UT) tablet Take 1,000 Units by mouth daily.   DEXILANT 60 MG capsule TAKE 1 CAPSULE BY MOUTH EVERY DAY   donepezil (ARICEPT) 5 MG tablet Take 1 tablet by mouth daily.   DULoxetine (CYMBALTA) 60 MG capsule Take 60 mg by mouth daily.   ezetimibe (ZETIA) 10 MG tablet TAKE 1 TABLET BY MOUTH EVERY DAY   fluticasone-salmeterol (ADVAIR) 100-50 MCG/ACT AEPB Inhale 1 puff into the lungs 2 (two) times daily.   magnesium oxide (MAG-OX) 400 MG tablet Take 400 mg by mouth daily.   meclizine (ANTIVERT) 25 MG tablet Take 25 mg by mouth 3 (three) times daily as needed for dizziness.   meloxicam (MOBIC) 15 MG tablet TAKE 1 TABLET BY MOUTH EVERY DAY   mirabegron ER (MYRBETRIQ) 50 MG TB24 tablet Take 1 tablet (50 mg total) by mouth daily.   MURO 128 5 % ophthalmic solution INSTILL 1 DROP IN RIGHT EYE THREE TIMES PER DAY   omeprazole (PRILOSEC) 20 MG capsule Take 20 mg by mouth daily.   pregabalin (LYRICA) 50 MG  capsule Take 1-2 capsules (50-100 mg total) by mouth at bedtime.   QULIPTA 60 MG TABS Take 1 tablet by mouth daily.   rosuvastatin (CRESTOR) 20 MG tablet TAKE 1 TABLET BY MOUTH EVERY DAY   SUMAtriptan (IMITREX) 100 MG tablet Take as directed PRN migraines   terbinafine (LAMISIL) 250 MG tablet Take 1 tablet (250 mg total) by mouth daily.   tiZANidine (ZANAFLEX) 2 MG tablet TAKE 1 TABLET (2 MG TOTAL) BY MOUTH 3 (THREE) TIMES DAILY.   traZODone (DESYREL) 50 MG tablet TAKE 1/2 TO 1 TABLET BY MOUTH AT BEDTIME AS NEEDED FOR SLEEP   vitamin E 180 MG (400 UNITS) capsule Take 400 Units by mouth daily.   vitamin B-12 (CYANOCOBALAMIN) 1000 MCG tablet Take 1,000 mcg by mouth daily. (Patient not taking: Reported on 08/15/2021)   [DISCONTINUED] Diclofenac-miSOPROStol 75-0.2 MG TBEC Take by mouth.   [DISCONTINUED] erythromycin ophthalmic ointment    [DISCONTINUED] fluticasone (FLONASE) 50 MCG/ACT nasal spray SPRAY 1 SPRAY INTO EACH NOSTRIL EVERY DAY (Patient not taking: No sig reported)   [DISCONTINUED] Fluticasone-Salmeterol (ADVAIR DISKUS) 100-50 MCG/DOSE AEPB Inhale 1 puff into the lungs in the morning and at bedtime.   [DISCONTINUED] Nirmatrelvir & Ritonavir (PAXLOVID) 20 x 150 MG & 10 x '100MG'$  TBPK Take 3 tablets by mouth in the morning and  at bedtime.   No facility-administered encounter medications on file as of 08/15/2021.    Allergies (verified) Diclofenac-misoprostol, Fenofibric acid, Lipitor [atorvastatin], and Nsaids   History: Past Medical History:  Diagnosis Date   B12 deficiency 12/27/2017   Diverticulitis    Fibromyalgia    GERD (gastroesophageal reflux disease)    High cholesterol    Migraine    MS (multiple sclerosis) ([facility])    PUD (peptic ulcer disease) 04/28/2018   Past Surgical History:  Procedure Laterality Date   BACK SURGERY  1991?   Pt usure of date   BREAST BIOPSY Right    COLONOSCOPY  09/2007   COLONOSCOPY WITH PROPOFOL N/A 08/28/2017   Procedure: COLONOSCOPY WITH PROPOFOL;   Surgeon: Christene Lye, MD;  Location: [facility] ENDOSCOPY;  Service: Endoscopy;  Laterality: N/A;   ESOPHAGOGASTRODUODENOSCOPY (EGD) WITH PROPOFOL N/A 11/24/2018   Procedure: ESOPHAGOGASTRODUODENOSCOPY (EGD) WITH PROPOFOL;  Surgeon: Lin Landsman, MD;  Location: [facility] ENDOSCOPY;  Service: Gastroenterology;  Laterality: N/A;   Foot sugery Right    3rd toe from the right"   Family History  Problem Relation Age of Onset   Heart disease Mother    Diabetes Mother    Kidney disease Mother    Hypertension Mother    Heart disease Father    Hyperlipidemia Father    Hypertension Father    Heart disease Other    Cancer Other        Breast   Diabetes Other    Hyperlipidemia Sister    Hypertension Sister    Kidney disease Sister    Diabetes Sister    Lung cancer Paternal Aunt    Cancer Maternal Grandmother        breast   Social History   Socioeconomic History   Marital status: Single    Spouse name: Not on file   Number of children: 2   Years of education: Not on file   Highest education level: Associate degree: academic program  Occupational History    Employer: DISABLED  Tobacco Use   Smoking status: Never   Smokeless tobacco: Never   Tobacco comments:    smoking cessation materials not required  Vaping Use   Vaping Use: Never used  Substance and Sexual Activity   Alcohol use: No    Alcohol/week: 0.0 standard drinks   Drug use: No   Sexual activity: Yes    Birth control/protection: Post-menopausal  Other Topics Concern   Not on file  Social History Narrative   Lives at home by herself.   Disable from chronic back pain since 1993   Diagnosed with MS in the 2000's   Social Determinants of Health   Financial Resource Strain: Low Risk    Difficulty of Paying Living Expenses: Not very hard  Food Insecurity: No Food Insecurity   Worried About Charity fundraiser in the Last Year: Never true   Ran Out of Food in the Last Year: Never true  Transportation Needs:  No Transportation Needs   Lack of Transportation (Medical): No   Lack of Transportation (Non-Medical): No  Physical Activity: Insufficiently Active   Days of Exercise per Week: 3 days   Minutes of Exercise per Session: 30 min  Stress: No Stress Concern Present   Feeling of Stress : Not at all  Social Connections: Moderately Integrated   Frequency of Communication with Friends and Family: More than three times a week   Frequency of Social Gatherings with Friends and Family: More than three times  a week   Attends Religious Services: More than 4 times per year   Active Member of Clubs or Organizations: Yes   Attends Music therapist: More than 4 times per year   Marital Status: Never married    Tobacco Counseling Counseling given: Not Answered Tobacco comments: smoking cessation materials not required   Clinical Intake:  Pre-visit preparation completed: Yes  Pain : 0-10 Pain Score: 8  Pain Type: Chronic pain Pain Location: Back (left shoulder) Pain Orientation: Mid, Lower Pain Descriptors / Indicators: Stabbing Pain Onset: More than a month ago Pain Frequency: Constant     BMI - recorded: 27.58 Nutritional Status: BMI 25 -29 Overweight Nutritional Risks: None Diabetes: Yes CBG done?: No Did pt. bring in CBG monitor from home?: No  How often do you need to have someone help you when you read instructions, pamphlets, or other written materials from your doctor or pharmacy?: 1 - Never Nutrition Risk Assessment:  Has the patient had any N/V/D within the last 2 months?  No  Does the patient have any non-healing wounds?  No  Has the patient had any unintentional weight loss or weight gain?  No   Diabetes:  Is the patient diabetic?  Yes  If diabetic, was a CBG obtained today?  No  Did the patient bring in their glucometer from home?  No  How often do you monitor your CBG's? Pt does not actively check blood sugar; diet controlled - no medications.    Financial Strains and Diabetes Management:  Are you having any financial strains with the device, your supplies or your medication? No .  Does the patient want to be seen by Chronic Care Management for management of their diabetes?  No  Would the patient like to be referred to a Nutritionist or for Diabetic Management?  No   Diabetic Exams:  Diabetic Eye Exam:  Overdue for diabetic eye exam. Pt has been advised about the importance in completing this exam. Pt to self schedule with [facility]; aware we will need copy of diabetic eye exam.   Diabetic Foot Exam: Completed 03/20/21.    Interpreter Needed?: No  Information entered by :: Clemetine Marker LPN   Activities of Daily Living In your present state of health, do you have any difficulty performing the following activities: 08/15/2021 05/05/2021  Hearing? N N  Vision? Y Y  Difficulty concentrating or making decisions? Tempie Donning  Walking or climbing stairs? N Y  Dressing or bathing? N N  Doing errands, shopping? N N  Preparing Food and eating ? N -  Using the Toilet? N -  In the past six months, have you accidently leaked urine? Y -  Comment wears pads for protection -  Do you have problems with loss of bowel control? N -  Managing your Medications? N -  Managing your Finances? N -  Housekeeping or managing your Housekeeping? N -  Some recent data might be hidden    Patient Care Team: Steele Sizer, MD as PCP - General (Family Medicine) Vladimir Crofts, MD as Consulting Physician (Neurology) Yolonda Kida, MD as Consulting Physician (Cardiology) Marybelle Killings, MD as Consulting Physician (Orthopedic Surgery) Garrel Ridgel, DPM as Consulting Physician (Podiatry)  Indicate any recent Medical Services you may have received from other than [facility] providers in the past year (date may be approximate).     Assessment:   This is a routine wellness examination for [place].  Hearing/Vision screen Hearing Screening -  Comments:: Pt denies hearing difficulty Vision Screening - Comments:: [facility] screen[facility]  Dietary issues and exercise activities discussed: Current Exercise Habits: Home exercise routine, Type of exercise: walking;strength training/weights, Time (Minutes): 30, Frequency (Times/Week): 3, Weekly Exercise (Minutes/Week): 90, Intensity: Mild, Exercise limited by: orthopedic condition(s);neurologic condition(s)   Goals Addressed             This Visit's Progress    DIET - INCREASE WATER INTAKE   On track    Recommend to drink at least 6-8 8oz glasses of water per day.       Depression Screen PHQ 2/9 Scores 08/15/2021 05/05/2021 03/20/2021 12/14/2020 08/11/2020 01/08/2020 12/04/2019  PHQ - 2 Score 0 0 0 0 1 4 0  PHQ- 9 Score - - - - '7 7 6    '$ Fall Risk Fall Risk  08/15/2021 05/05/2021 03/20/2021 12/14/2020 09/20/2020  Falls in the past year? 0 0 0 0 0  Number falls in past yr: 0 - 0 0 0  Injury with Fall? 0 - 0 0 0  Comment - - - - -  Risk Factor Category  - - - - -  Risk for fall due to : No Fall Risks - - - -  Risk for fall due to: Comment - - - - -  Follow up Falls prevention discussed Falls prevention discussed - - -    FALL RISK PREVENTION PERTAINING TO THE HOME:  Any stairs in or around the home? Yes  If so, are there any without handrails? Yes  4 steps out front; landlord aware Home free of loose throw rugs in walkways, pet beds, electrical cords, etc? Yes  Adequate lighting in your home to reduce risk of falls? Yes   ASSISTIVE DEVICES UTILIZED TO PREVENT FALLS:  Life alert? No  Use of a cane, walker or w/c? No  Grab bars in the bathroom? No  Shower chair or bench in shower? No  Elevated toilet seat or a handicapped toilet? No   TIMED UP AND GO:  Was the test performed? Yes .  Length of time to ambulate 10 feet: 5 sec.   Gait steady and fast without use of assistive device  Cognitive Function:     6CIT Screen 08/15/2021 08/11/2020  08/07/2018  What Year? 0 points 0 points 0 points  What month? 0 points 0 points 0 points  What time? 0 points 0 points 0 points  Count back from 20 0 points 0 points 0 points  Months in reverse 0 points 0 points 2 points  Repeat phrase 0 points 2 points 4 points  Total Score 0 2 6    Immunizations Immunization History  Administered Date(s) Administered   Influenza, Quadrivalent, Recombinant, Inj, Pf 01/11/2020   Influenza,inj,Quad PF,6+ Mos 09/20/2020   Moderna Sars-Covid-2 Vaccination 01/29/2020, 02/26/2020, 10/24/2020, 07/27/2021   Td 12/25/2015   Zoster Recombinat (Shingrix) 01/12/2020, 07/02/2020    TDAP status: Up to date  Flu Vaccine status: Up to date  Pneumococcal vaccine status: Due, Education has been provided regarding the importance of this vaccine. Advised may receive this vaccine at local pharmacy or Health Dept. Aware to provide a copy of the vaccination record if obtained from local pharmacy or Health Dept. Verbalized acceptance and understanding.  Covid-19 vaccine status: Completed vaccines  Qualifies for Shingles Vaccine? Yes   Zostavax completed Yes   Shingrix Completed?: Yes  Screening Tests Health Maintenance  Topic Date Due   Pneumococcal Vaccine 32-53 Years old (1 -  PCV) Never done   OPHTHALMOLOGY EXAM  Never done   INFLUENZA VACCINE  07/24/2021   HEMOGLOBIN A1C  09/20/2021   COVID-19 Vaccine (5 - Booster for Moderna series) 11/26/2021   MAMMOGRAM  12/14/2021   FOOT EXAM  03/20/2022   URINE MICROALBUMIN  03/20/2022   PAP SMEAR-Modifier  01/07/2023   TETANUS/TDAP  12/24/2025   COLONOSCOPY (Pts 45-46yr Insurance coverage will need to be confirmed)  08/29/2027   Hepatitis C Screening  Completed   HIV Screening  Completed   Zoster Vaccines- Shingrix  Completed   HPV VACCINES  Aged Out    Health Maintenance  Health Maintenance Due  Topic Date Due   Pneumococcal Vaccine 065671Years old (1 - PCV) Never done   OPHTHALMOLOGY EXAM  Never done    INFLUENZA VACCINE  07/24/2021    Colorectal cancer screening: Type of screening: Colonoscopy. Completed 08/28/17. Repeat every 10 years  Mammogram status: Completed 12/14/20. Repeat every year  Bone density status: due age 65 Lung Cancer Screening: (Low Dose CT Chest recommended if Age 65-80years, 30 pack-year currently smoking OR have quit w/in 15years.) does not qualify.   Additional Screening:  Hepatitis C Screening: does qualify; Completed 09/06/17  Vision Screening: Recommended annual ophthalmology exams for early detection of glaucoma and other disorders of the eye. Is the patient up to date with their annual eye exam?  No  Who is the provider or what is the name of the office in which the patient attends annual eye exams? [facility]: Recommended annual dental exams for proper oral hygiene  Community Resource Referral / Chronic Care Management: CRR required this visit?  No   CCM required this visit?  No      Plan:     I have personally reviewed and noted the following in the patient's chart:   Medical and social history Use of alcohol, tobacco or illicit drugs  Current medications and supplements including opioid prescriptions.  Functional ability and status Nutritional status Physical activity Advanced directives List of other physicians Hospitalizations, surgeries, and ER visits in previous 12 months Vitals Screenings to include cognitive, depression, and falls Referrals and appointments  In addition, I have reviewed and discussed with patient certain preventive protocols, quality metrics, and best practice recommendations. A written personalized care plan for preventive services as well as general preventive health recommendations were provided to patient.     KClemetine Marker LPN   [id number]  Nurse Notes: patient states she takes a lot of her medications on an as needed basis. She also said she might restart vitamin B12 even though  d/c last year due to levels above normal limits; advised to dicuss with Dr. SAncil Boozerat next visit. Pt also advised to bring all medications to next appt and CCM information provided as patient would benefit from medication reconciliation and continuity of care with care manager but she declined at this time.

## 2021-08-15 NOTE — Patient Instructions (Signed)
Ashlee Richardson , Thank you for taking time to come for your Medicare Wellness Visit. I appreciate your ongoing commitment to your health goals. Please review the following plan we discussed and let me know if I can assist you in the future.   Screening recommendations/referrals: Colonoscopy: done 08/28/17 repeat 08/2027 Mammogram: done 12/14/20 Bone Density: due age 65 Recommended yearly ophthalmology/optometry visit for glaucoma screening and checkup Recommended yearly dental visit for hygiene and checkup  Vaccinations: Influenza vaccine: done 09/20/20 Pneumococcal vaccine: due Tdap vaccine: done 2017 Shingles vaccine: done 01/12/20 & 07/02/20  Covid-19: done 01/29/20, 02/26/20, 10/24/20 & 07/27/21  Advanced directives: Advance directive discussed with you today. Even though you declined this today please call our office should you change your mind and we can give you the proper paperwork for you to fill out.   Conditions/risks identified: Recommend continue fall prevention in the home  Next appointment: Follow up in one year for your annual wellness visit.   Preventive Care 40-64 Years, Female Preventive care refers to lifestyle choices and visits with your health care provider that can promote health and wellness. What does preventive care include? A yearly physical exam. This is also called an annual well check. Dental exams once or twice a year. Routine eye exams. Ask your health care provider how often you should have your eyes checked. Personal lifestyle choices, including: Daily care of your teeth and gums. Regular physical activity. Eating a healthy diet. Avoiding tobacco and drug use. Limiting alcohol use. Practicing safe sex. Taking low-dose aspirin daily starting at age 42. Taking vitamin and mineral supplements as recommended by your health care provider. What happens during an annual well check? The services and screenings done by your health care provider during your annual  well check will depend on your age, overall health, lifestyle risk factors, and family history of disease. Counseling  Your health care provider may ask you questions about your: Alcohol use. Tobacco use. Drug use. Emotional well-being. Home and relationship well-being. Sexual activity. Eating habits. Work and work Statistician. Method of birth control. Menstrual cycle. Pregnancy history. Screening  You may have the following tests or measurements: Height, weight, and BMI. Blood pressure. Lipid and cholesterol levels. These may be checked every 5 years, or more frequently if you are over 33 years old. Skin check. Lung cancer screening. You may have this screening every year starting at age 2 if you have a 30-pack-year history of smoking and currently smoke or have quit within the past 15 years. Fecal occult blood test (FOBT) of the stool. You may have this test every year starting at age 61. Flexible sigmoidoscopy or colonoscopy. You may have a sigmoidoscopy every 5 years or a colonoscopy every 10 years starting at age 68. Hepatitis C blood test. Hepatitis B blood test. Sexually transmitted disease (STD) testing. Diabetes screening. This is done by checking your blood sugar (glucose) after you have not eaten for a while (fasting). You may have this done every 1-3 years. Mammogram. This may be done every 1-2 years. Talk to your health care provider about when you should start having regular mammograms. This may depend on whether you have a family history of breast cancer. BRCA-related cancer screening. This may be done if you have a family history of breast, ovarian, tubal, or peritoneal cancers. Pelvic exam and Pap test. This may be done every 3 years starting at age 55. Starting at age 28, this may be done every 5 years if you have a Pap test  in combination with an HPV test. Bone density scan. This is done to screen for osteoporosis. You may have this scan if you are at high risk for  osteoporosis. Discuss your test results, treatment options, and if necessary, the need for more tests with your health care provider. Vaccines  Your health care provider may recommend certain vaccines, such as: Influenza vaccine. This is recommended every year. Tetanus, diphtheria, and acellular pertussis (Tdap, Td) vaccine. You may need a Td booster every 10 years. Zoster vaccine. You may need this after age 76. Pneumococcal 13-valent conjugate (PCV13) vaccine. You may need this if you have certain conditions and were not previously vaccinated. Pneumococcal polysaccharide (PPSV23) vaccine. You may need one or two doses if you smoke cigarettes or if you have certain conditions. Talk to your health care provider about which screenings and vaccines you need and how often you need them. This information is not intended to replace advice given to you by your health care provider. Make sure you discuss any questions you have with your health care provider. Document Released: 01/06/2016 Document Revised: 08/29/2016 Document Reviewed: 10/11/2015 Elsevier Interactive Patient Education  2017 [place] Prevention in the Home Falls can cause injuries. They can happen to people of all ages. There are many things you can do to make your home safe and to help prevent falls. What can I do on the outside of my home? Regularly fix the edges of walkways and driveways and fix any cracks. Remove anything that might make you trip as you walk through a door, such as a raised step or threshold. Trim any bushes or trees on the path to your home. Use bright outdoor lighting. Clear any walking paths of anything that might make someone trip, such as rocks or tools. Regularly check to see if handrails are loose or broken. Make sure that both sides of any steps have handrails. Any raised decks and porches should have guardrails on the edges. Have any leaves, snow, or ice cleared regularly. Use sand or  salt on walking paths during winter. Clean up any spills in your garage right away. This includes oil or grease spills. What can I do in the bathroom? Use night lights. Install grab bars by the toilet and in the tub and shower. Do not use towel bars as grab bars. Use non-skid mats or decals in the tub or shower. If you need to sit down in the shower, use a plastic, non-slip stool. Keep the floor dry. Clean up any water that spills on the floor as soon as it happens. Remove soap buildup in the tub or shower regularly. Attach bath mats securely with double-sided non-slip rug tape. Do not have throw rugs and other things on the floor that can make you trip. What can I do in the bedroom? Use night lights. Make sure that you have a light by your bed that is easy to reach. Do not use any sheets or blankets that are too big for your bed. They should not hang down onto the floor. Have a firm chair that has side arms. You can use this for support while you get dressed. Do not have throw rugs and other things on the floor that can make you trip. What can I do in the kitchen? Clean up any spills right away. Avoid walking on wet floors. Keep items that you use a lot in easy-to-reach places. If you need to reach something above you, use a strong step  stool that has a grab bar. Keep electrical cords out of the way. Do not use floor polish or wax that makes floors slippery. If you must use wax, use non-skid floor wax. Do not have throw rugs and other things on the floor that can make you trip. What can I do with my stairs? Do not leave any items on the stairs. Make sure that there are handrails on both sides of the stairs and use them. Fix handrails that are broken or loose. Make sure that handrails are as long as the stairways. Check any carpeting to make sure that it is firmly attached to the stairs. Fix any carpet that is loose or worn. Avoid having throw rugs at the top or bottom of the stairs. If  you do have throw rugs, attach them to the floor with carpet tape. Make sure that you have a light switch at the top of the stairs and the bottom of the stairs. If you do not have them, ask someone to add them for you. What else can I do to help prevent falls? Wear shoes that: Do not have high heels. Have rubber bottoms. Are comfortable and fit you well. Are closed at the toe. Do not wear sandals. If you use a stepladder: Make sure that it is fully opened. Do not climb a closed stepladder. Make sure that both sides of the stepladder are locked into place. Ask someone to hold it for you, if possible. Clearly mark and make sure that you can see: Any grab bars or handrails. First and last steps. Where the edge of each step is. Use tools that help you move around (mobility aids) if they are needed. These include: Canes. Walkers. Scooters. Crutches. Turn on the lights when you go into a dark area. Replace any light bulbs as soon as they burn out. Set up your furniture so you have a clear path. Avoid moving your furniture around. If any of your floors are uneven, fix them. If there are any pets around you, be aware of where they are. Review your medicines with your doctor. Some medicines can make you feel dizzy. This can increase your chance of falling. Ask your doctor what other things that you can do to help prevent falls. This information is not intended to replace advice given to you by your health care provider. Make sure you discuss any questions you have with your health care provider. Document Released: 10/06/2009 Document Revised: 05/17/2016 Document Reviewed: 01/14/2015 Elsevier Interactive Patient Education  2017 [employer].

## 2021-08-16 ENCOUNTER — Encounter: Payer: Self-pay | Admitting: Physical Therapy

## 2021-08-16 ENCOUNTER — Ambulatory Visit: Payer: Medicare Other | Admitting: Physical Therapy

## 2021-08-16 DIAGNOSIS — M5441 Lumbago with sciatica, right side: Secondary | ICD-10-CM

## 2021-08-16 DIAGNOSIS — G8929 Other chronic pain: Secondary | ICD-10-CM | POA: Diagnosis not present

## 2021-08-16 DIAGNOSIS — M5442 Lumbago with sciatica, left side: Secondary | ICD-10-CM | POA: Diagnosis not present

## 2021-08-16 NOTE — Therapy (Signed)
[facility] [address] [place], [address] Phone: [phone]   Fax:  [phone]  Physical Therapy Treatment  Patient Details  Name: Ashlee Richardson MRN: [id number] Date of Birth: 1956/01/16 Referring Provider (PT): Lorin Mercy MD   Encounter Date: 08/16/2021   PT End of Session - 08/16/21 1043     Visit Number 7    Number of Visits 17    Date for PT Re-Evaluation 09/22/21    Authorization Type UHC Medicare    PT Start Time 1030    PT Stop Time 1114    PT Time Calculation (min) 44 min    Equipment Utilized During Treatment Gait belt    Activity Tolerance Patient tolerated treatment well;Patient limited by fatigue    Behavior During Therapy [facility] for tasks assessed/performed             Past Medical History:  Diagnosis Date   B12 deficiency 12/27/2017   Diverticulitis    Fibromyalgia    GERD (gastroesophageal reflux disease)    High cholesterol    Migraine    MS (multiple sclerosis) ([place])    PUD (peptic ulcer disease) 04/28/2018    Past Surgical History:  Procedure Laterality Date   [place]?   Pt usure of date   BREAST BIOPSY Right    COLONOSCOPY  09/2007   COLONOSCOPY WITH PROPOFOL N/A 08/28/2017   Procedure: COLONOSCOPY WITH PROPOFOL;  Surgeon: Christene Lye, MD;  Location: [facility] ENDOSCOPY;  Service: Endoscopy;  Laterality: N/A;   ESOPHAGOGASTRODUODENOSCOPY (EGD) WITH PROPOFOL N/A 11/24/2018   Procedure: ESOPHAGOGASTRODUODENOSCOPY (EGD) WITH PROPOFOL;  Surgeon: Lin Landsman, MD;  Location: [facility] ENDOSCOPY;  Service: Gastroenterology;  Laterality: N/A;   Foot sugery Right    3rd toe from the right"    There were no vitals filed for this visit.   Subjective Assessment - 08/16/21 1033     Subjective Pt reports some LE soreness following last session but reports a reduction in LBP to 2/10 on NPRS. She states that her pain is the lowest it has ever been.    Pertinent  History Ashlee Richardson is a 65 year old female with c/o bilateral LBP with radiating symptoms down R LE. She reports having long standing back pain that limits her ability sit <> stand transfer, walk on treadmill, and perform light housework. Patient also reports L sided neck pain that radiates to L arm. Pt reports her back pain also interfers with her sleep. She reports taking meloxicam to help reduce her LBP. 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 Patient says that physical therapy must happen prior to eligibility for low back surgery. Patient denies having cancer or any unexplained weigh loss. She reports having incontinence but that it is controlled by medication. She also reports having a fall within the last 6 months. Pt has a PMH of previous lumbar fusion L4-S1.    Limitations Sitting;Lifting;Standing;Walking    How long can you sit comfortably? 30 min    How long can you stand comfortably? 30 min    How long can you walk comfortably? 2 miles    Patient Stated Goals To return to physical activity and gym exercises             Therex   Nu Step L3 x 5 min for gentle strengthening/warmup  Total Gym Squats 2 x 10 reps level 26 with  cues to go lower for depth and increased strengthening; resting with DKTC    Standing Hip ABD 1 x 10 reps #40   STS 1 x 10 reps with physioball overhead with slight lumbar extension at terminal stand    Modified Bird dog 1 x 5 reps with 5 sec hold    Lower trunk rotation 1 x 8 reps with 5 sec holds   Seated FWD/Lateral Physioball Rollouts 1 x 8 reps each with cues to hold for better stretch   Supine Glute stretch 2 x 30 sec bilat with therapist overpressure                          PT Education - 08/16/21 1216     Education Details therex form/technique    Person(s) Educated Patient    Methods Explanation;Demonstration    Comprehension  Verbalized understanding;Returned demonstration              PT Short Term Goals - 07/24/21 1149       PT SHORT TERM GOAL #1   Title Pt will be independent with HEP in order to self-manage LBP.    Baseline 07/24/21    Time 3    Period Weeks    Status New               PT Long Term Goals - 07/24/21 1151       PT LONG TERM GOAL #1   Title Patient will be demonstrate a decrease in worse pain from 10/10 to 7/10 in order to demonstrate clinically significant reduction in pain.    Baseline 07/24/21 10/10    Time 6    Period Weeks    Status New    Target Date 09/04/21      PT LONG TERM GOAL #2   Title Patient will demonstrate gross bilat hip strength of 4+/5 in order to improve sit<>stand transfer and steadiness with gait.    Baseline 07/24/21: R:4-/5 L: 4/5    Time 6    Period Weeks    Status New    Target Date 09/04/21      PT LONG TERM GOAL #3   Title Patient will be able to comfortably sit for 45 minutes to be able to perform occupational tasks.    Baseline 07/24/21: less than 30 min    Time 8    Period Weeks    Status New    Target Date 09/04/21      PT LONG TERM GOAL #4   Title Pt will perform 5XSTS within 20 seconds in order to demonstrate improved LE strength and decrease the likelihood of falling.    Baseline 81/22: 30 sec    Time 6    Period Weeks    Status New    Target Date 09/04/21                   Plan - 08/16/21 1213     Clinical Impression Statement Pt tolerated session well with no reports of increased pain following therapeutic exercise. Pt did demostrate fatigue with LE and core strengthening this visit limiting session minimally. Pt will continue to benefit from skilled therapy to ensure safe exercise progression and achieve set goals.    Personal Factors and Comorbidities Age;Comorbidity 3+;Profession    Comorbidities MS, prior surgery, chronic low back pain, chronic neck pain, fibromyalgia    Examination-Activity Limitations Bed  Mobility;Squat;Lift;Stairs;Bend;Sit;Sleep    Examination-Participation Restrictions Occupation;Cleaning;Personal Finances;Yard Work;Community  Activity    Stability/Clinical Decision Making Evolving/Moderate complexity    Clinical Decision Making Moderate    Rehab Potential Fair    PT Frequency 2x / week    PT Treatment/Interventions Therapeutic exercise;Therapeutic activities;Patient/family education;Passive range of motion;Manual techniques;Dry needling;Cryotherapy;Electrical Stimulation;Moist Heat;Gait training;Balance training;Neuromuscular re-education    PT Next Visit Plan Update; recheck HEP    PT Home Exercise Plan FWD flexion, sit to stands, seated hip abd    Consulted and Agree with Plan of Care Patient             Patient will benefit from skilled therapeutic intervention in order to improve the following deficits and impairments:  Pain, Hypomobility, Decreased strength, Decreased range of motion, Decreased endurance, Decreased activity tolerance, Increased edema, Abnormal gait, Impaired sensation, Improper body mechanics, Decreased mobility, Decreased balance, Decreased coordination  Visit Diagnosis: Chronic bilateral low back pain with bilateral sciatica     Problem List Patient Active Problem List   Diagnosis Date Noted   Type 2 diabetes mellitus with pressure callus ([place]) 03/20/2021   Diabetes mellitus type 2, diet-controlled ([place]) 03/20/2021   Onychomycosis of multiple toenails with type 2 diabetes mellitus ([place]) 03/20/2021   Chronic idiopathic constipation 03/20/2021   Lumbar foraminal stenosis 03/08/2021   Myalgia due to statin 05/26/2020   History of lumbar fusion 11/22/2019   GERD (gastroesophageal reflux disease) 04/28/2018   Pure hypercholesterolemia 04/28/2018   Diverticulosis 04/28/2018   RLS (restless legs syndrome) 04/28/2018   Angina pectoris ([place]) 04/28/2018   Vitamin D deficiency 12/27/2017   Chronic fatigue, unspecified 12/27/2017   Intractable  migraine without Ashlee and without status migrainosus 10/17/2015   DDD (degenerative disc disease), cervical 05/23/2015   Bilateral occipital neuralgia 05/23/2015   DDD (degenerative disc disease), lumbar 05/23/2015   Multiple sclerosis ([place]) 05/23/2015   Cervical disc disorder with radiculopathy of cervical region 04/20/2015   Right arm numbness 04/20/2015   Right arm weakness 04/20/2015     Ashlee Richardson DPT Ashlee Richardson, SPT  Ashlee Richardson 08/16/2021, 3:23 PM  [facility] [address] [place], [address] Phone: [phone]   Fax:  [phone]  Name: Ashlee Richardson MRN: [id number] Date of Birth: 1956-11-06

## 2021-08-17 LAB — HM DIABETES EYE EXAM

## 2021-08-18 DIAGNOSIS — H2513 Age-related nuclear cataract, bilateral: Secondary | ICD-10-CM | POA: Diagnosis not present

## 2021-08-21 ENCOUNTER — Ambulatory Visit: Payer: Medicare Other | Admitting: Physical Therapy

## 2021-08-21 ENCOUNTER — Encounter: Payer: Self-pay | Admitting: Physical Therapy

## 2021-08-21 DIAGNOSIS — M5442 Lumbago with sciatica, left side: Secondary | ICD-10-CM | POA: Diagnosis not present

## 2021-08-21 DIAGNOSIS — G8929 Other chronic pain: Secondary | ICD-10-CM

## 2021-08-21 DIAGNOSIS — M5441 Lumbago with sciatica, right side: Secondary | ICD-10-CM | POA: Diagnosis not present

## 2021-08-21 NOTE — Therapy (Signed)
[facility] [address], [place], [address] Phone: [phone]   Fax:  [phone]  Physical Therapy Treatment  Patient Details  Name: Ashlee Richardson MRN: [id number] Date of Birth: [birth year] Referring Provider (PT): Lorin Mercy MD   Encounter Date: 08/21/2021   PT End of Session - 08/21/21 1138     Visit Number 8    Number of Visits 17    Date for PT Re-Evaluation 09/22/21    Authorization Type UHC Medicare    Authorization Time Period 03/14/21-06/06/21    PT Start Time 1030    PT Stop Time 1114    PT Time Calculation (min) 44 min    Activity Tolerance Patient tolerated treatment well             Past Medical History:  Diagnosis Date   B12 deficiency 12/27/2017   Diverticulitis    Fibromyalgia    GERD (gastroesophageal reflux disease)    High cholesterol    Migraine    MS (multiple sclerosis) ([place])    PUD (peptic ulcer disease) 04/28/2018    Past Surgical History:  Procedure Laterality Date   [place]?   Pt usure of date   BREAST BIOPSY Right    COLONOSCOPY  09/2007   COLONOSCOPY WITH PROPOFOL N/A 08/28/2017   Procedure: COLONOSCOPY WITH PROPOFOL;  Surgeon: Christene Lye, MD;  Location: [facility] ENDOSCOPY;  Service: Endoscopy;  Laterality: N/A;   ESOPHAGOGASTRODUODENOSCOPY (EGD) WITH PROPOFOL N/A 11/24/2018   Procedure: ESOPHAGOGASTRODUODENOSCOPY (EGD) WITH PROPOFOL;  Surgeon: Lin Landsman, MD;  Location: [facility] ENDOSCOPY;  Service: Gastroenterology;  Laterality: N/A;   Foot sugery Right    3rd toe from the right"    There were no vitals filed for this visit.   Subjective Assessment - 08/21/21 1031     Subjective Pt reports increase in pain medication use as needed but continues to have reduced LBP to 3/10 on NPRS. She states that she believes the combination therapy and pain medication are contributing to decreased in pain.    Pertinent History Ashlee Richardson is a 65  year old female with c/o bilateral LBP with radiating symptoms down R LE. She reports having long standing back pain that limits her ability sit <> stand transfer, walk on treadmill, and perform light housework. Patient also reports L sided neck pain that radiates to L arm. Pt reports her back pain also interfers with her sleep. She reports taking meloxicam to help reduce her LBP. 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 Patient says that physical therapy must happen prior to eligibility for low back surgery. Patient denies having cancer or any unexplained weigh loss. She reports having incontinence but that it is controlled by medication. She also reports having a fall within the last 6 months. Pt has a PMH of previous lumbar fusion L4-S1.    Limitations Sitting;Lifting;Standing;Walking    How long can you sit comfortably? 30 min    How long can you stand comfortably? 30 min    How long can you walk comfortably? 2 miles    Patient Stated Goals To return to physical activity and gym exercises            Therapeutic Exercise  Treadmill Walking 3.5 MPH x 5 min on incline elevated to 3 to increase lumbar flexion bias to decrease LBP and increase LE and cardiovascular endurance   TRX Squats  2 x 8 reps with cues for foot placement and depth   Monster walks 2 x 10 ft GTB with vcueing to lift feet and keep them pointed straight   Modified Bird dog 1 x 5 reps with 5 sec hold    OMEGA Leg Extension 3 x 10 reps 35# with initial set up   OMEGA Leg Curl 2 x 10 reps #35   Seated trunk rotation 1 x 3 x 30 sec hold with manual overpressure reps   Open book 2 x 30sec bilat with pt cues for set up   Seated FWD/Lateral Physioball Rollouts 1 x 8 reps each with cues to hold for better stretch                            PT Education - 08/21/21 1354     Education Details therex form/technique     Person(s) Educated Patient    Methods Explanation;Demonstration    Comprehension Verbalized understanding;Returned demonstration              PT Short Term Goals - 07/24/21 1149       PT SHORT TERM GOAL #1   Title Pt will be independent with HEP in order to self-manage LBP.    Baseline 07/24/21    Time 3    Period Weeks    Status New               PT Long Term Goals - 07/24/21 1151       PT LONG TERM GOAL #1   Title Patient will be demonstrate a decrease in worse pain from 10/10 to 7/10 in order to demonstrate clinically significant reduction in pain.    Baseline 07/24/21 10/10    Time 6    Period Weeks    Status New    Target Date 09/04/21      PT LONG TERM GOAL #2   Title Patient will demonstrate gross bilat hip strength of 4+/5 in order to improve sit<>stand transfer and steadiness with gait.    Baseline 07/24/21: R:4-/5 L: 4/5    Time 6    Period Weeks    Status New    Target Date 09/04/21      PT LONG TERM GOAL #3   Title Patient will be able to comfortably sit for 45 minutes to be able to perform occupational tasks.    Baseline 07/24/21: less than 30 min    Time 8    Period Weeks    Status New    Target Date 09/04/21      PT LONG TERM GOAL #4   Title Pt will perform 5XSTS within 20 seconds in order to demonstrate improved LE strength and decrease the likelihood of falling.    Baseline 81/22: 30 sec    Time 6    Period Weeks    Status New    Target Date 09/04/21                   Plan - 08/21/21 1356     Clinical Impression Statement Pt tolerated session well evidenced by decrease in LBP following therapeutic exercise. PT session focused on lumbar mobility and LE/core strengthneing. Pt utilized multimodal cueing to ensure safe and effective exercise progression with success. Continue PT POC    Personal Factors and Comorbidities Age;Comorbidity 3+;Profession    Comorbidities MS, prior surgery, chronic low back pain, chronic neck pain,  fibromyalgia  Examination-Activity Limitations Bed Mobility;Squat;Lift;Stairs;Bend;Sit;Sleep    Examination-Participation Restrictions Occupation;Cleaning;Personal Finances;Yard Work;Community Activity    Stability/Clinical Decision Making Evolving/Moderate complexity    Clinical Decision Making Moderate    Rehab Potential Fair    PT Frequency 2x / week    PT Duration 6 weeks    PT Treatment/Interventions Therapeutic exercise;Therapeutic activities;Patient/family education;Passive range of motion;Manual techniques;Dry needling;Cryotherapy;Electrical Stimulation;Moist Heat;Gait training;Balance training;Neuromuscular re-education    PT Next Visit Plan Update; recheck HEP    PT Home Exercise Plan FWD flexion, sit to stands, seated hip abd    Consulted and Agree with Plan of Care Patient             Patient will benefit from skilled therapeutic intervention in order to improve the following deficits and impairments:  Pain, Hypomobility, Decreased strength, Decreased range of motion, Decreased endurance, Decreased activity tolerance, Increased edema, Abnormal gait, Impaired sensation, Improper body mechanics, Decreased mobility, Decreased balance, Decreased coordination  Visit Diagnosis: Chronic bilateral low back pain with bilateral sciatica     Problem List Patient Active Problem List   Diagnosis Date Noted   Type 2 diabetes mellitus with pressure callus ([place]) 03/20/2021   Diabetes mellitus type 2, diet-controlled ([place]) 03/20/2021   Onychomycosis of multiple toenails with type 2 diabetes mellitus ([place]) 03/20/2021   Chronic idiopathic constipation 03/20/2021   Lumbar foraminal stenosis 03/08/2021   Myalgia due to statin 05/26/2020   History of lumbar fusion 11/22/2019   GERD (gastroesophageal reflux disease) 04/28/2018   Pure hypercholesterolemia 04/28/2018   Diverticulosis 04/28/2018   RLS (restless legs syndrome) 04/28/2018   Angina pectoris ([place]) 04/28/2018   Vitamin D  deficiency 12/27/2017   Chronic fatigue, unspecified 12/27/2017   Intractable migraine without aura and without status migrainosus 10/17/2015   DDD (degenerative disc disease), cervical 05/23/2015   Bilateral occipital neuralgia 05/23/2015   DDD (degenerative disc disease), lumbar 05/23/2015   Multiple sclerosis ([place]) 05/23/2015   Cervical disc disorder with radiculopathy of cervical region 04/20/2015   Right arm numbness 04/20/2015   Right arm weakness 04/20/2015    Durwin Reges DPT Sharion Settler, SPT  Durwin Reges 08/21/2021, 4:19 PM  [facility] [address], [place], [address] Phone: [phone]   Fax:  [phone]  Name: Ashlee Richardson MRN: [id number] Date of Birth: [birth year]

## 2021-08-22 DIAGNOSIS — M2041 Other hammer toe(s) (acquired), right foot: Secondary | ICD-10-CM | POA: Diagnosis not present

## 2021-08-22 DIAGNOSIS — M180 Bilateral primary osteoarthritis of first carpometacarpal joints: Secondary | ICD-10-CM | POA: Diagnosis not present

## 2021-08-23 ENCOUNTER — Ambulatory Visit: Payer: Medicare Other | Admitting: Physical Therapy

## 2021-08-30 ENCOUNTER — Ambulatory Visit: Payer: Medicare Other | Attending: Orthopaedic Surgery | Admitting: Physical Therapy

## 2021-08-30 ENCOUNTER — Encounter: Payer: Self-pay | Admitting: Physical Therapy

## 2021-08-30 ENCOUNTER — Ambulatory Visit (INDEPENDENT_AMBULATORY_CARE_PROVIDER_SITE_OTHER): Payer: Medicare Other | Admitting: Podiatry

## 2021-08-30 ENCOUNTER — Encounter: Payer: Self-pay | Admitting: Podiatry

## 2021-08-30 ENCOUNTER — Other Ambulatory Visit: Payer: Self-pay

## 2021-08-30 DIAGNOSIS — G8929 Other chronic pain: Secondary | ICD-10-CM | POA: Diagnosis not present

## 2021-08-30 DIAGNOSIS — B351 Tinea unguium: Secondary | ICD-10-CM | POA: Diagnosis not present

## 2021-08-30 DIAGNOSIS — M79676 Pain in unspecified toe(s): Secondary | ICD-10-CM | POA: Diagnosis not present

## 2021-08-30 DIAGNOSIS — M5442 Lumbago with sciatica, left side: Secondary | ICD-10-CM | POA: Diagnosis not present

## 2021-08-30 DIAGNOSIS — M5441 Lumbago with sciatica, right side: Secondary | ICD-10-CM | POA: Insufficient documentation

## 2021-08-30 DIAGNOSIS — L603 Nail dystrophy: Secondary | ICD-10-CM

## 2021-08-30 NOTE — Therapy (Signed)
[facility] [address], [place], [address] Phone: [phone]   Fax:  [phone]  Physical Therapy Treatment  Patient Details  Name: Ashlee Richardson MRN: [id number] Date of Birth: 10/30/1956 Referring Provider (PT): Lorin Mercy MD   Encounter Date: 08/30/2021   PT End of Session - 08/30/21 0940     Visit Number 9    Number of Visits 17    Date for PT Re-Evaluation 09/22/21    Authorization Type UHC Medicare    Authorization Time Period 03/14/21-06/06/21    PT Start Time 0900    PT Stop Time 0942    PT Time Calculation (min) 42 min    Equipment Utilized During Treatment Gait belt    Activity Tolerance Patient tolerated treatment well    Behavior During Therapy [facility] for tasks assessed/performed             Past Medical History:  Diagnosis Date   B12 deficiency 12/27/2017   Diverticulitis    Fibromyalgia    GERD (gastroesophageal reflux disease)    High cholesterol    Migraine    MS (multiple sclerosis) ([place])    PUD (peptic ulcer disease) 04/28/2018    Past Surgical History:  Procedure Laterality Date   [place]?   Pt usure of date   BREAST BIOPSY Right    COLONOSCOPY  09/2007   COLONOSCOPY WITH PROPOFOL N/A 08/28/2017   Procedure: COLONOSCOPY WITH PROPOFOL;  Surgeon: Christene Lye, MD;  Location: [facility] ENDOSCOPY;  Service: Endoscopy;  Laterality: N/A;   ESOPHAGOGASTRODUODENOSCOPY (EGD) WITH PROPOFOL N/A 11/24/2018   Procedure: ESOPHAGOGASTRODUODENOSCOPY (EGD) WITH PROPOFOL;  Surgeon: Lin Landsman, MD;  Location: [facility] ENDOSCOPY;  Service: Gastroenterology;  Laterality: N/A;   Foot sugery Right    3rd toe from the right"    There were no vitals filed for this visit.   Subjective Assessment - 08/30/21 0906     Subjective Pt reports feeling well today with pain only 3/10 on NPRS. She reports she has a "catch" in her L hip that is bothering her.    Pertinent History Ashlee Richardson is a 65 year old female with c/o bilateral LBP with radiating symptoms down R LE. She reports having long standing back pain that limits her ability sit <> stand transfer, walk on treadmill, and perform light housework. Patient also reports L sided neck pain that radiates to L arm. Pt reports her back pain also interfers with her sleep. She reports taking meloxicam to help reduce her LBP. 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 Patient says that physical therapy must happen prior to eligibility for low back surgery. Patient denies having cancer or any unexplained weigh loss. She reports having incontinence but that it is controlled by medication. She also reports having a fall within the last 6 months. Pt has a PMH of previous lumbar fusion L4-S1.    Limitations Sitting;Lifting;Standing;Walking              Therapeutic Exercise   Treadmill Walking 2.5 MPH x 5 min on incline elevated to 2 to increase lumbar flexion bias to decrease LBP and increase LE and cardiovascular endurance   Omega Leg Press 3 x 10 reps 55# to increase LE strength with initial set up needed  TRX Squats 2 x 8 reps with cues for foot placement and depth  Matric Hip ABD #55 2 x  10 reps each LE with cues to keep toes straight and to perform slowly  Dead Bug without alt arm 2 x 6 reps with rest breaks in between 1-2 reps (3-5) sec hold  Lower trunk Rotation 1 x 10 reps with 5 sec hold  Open book R>L 3 x 30 sec with cueing to rotate with head and arms  DKTC 3 x 30 sec                            PT Education - 08/30/21 0944     Education Details therex form/technique    Person(s) Educated Patient    Methods Explanation;Demonstration    Comprehension Verbalized understanding;Returned demonstration              PT Short Term Goals - 07/24/21 1149       PT SHORT TERM GOAL #1   Title Pt will be  independent with HEP in order to self-manage LBP.    Baseline 07/24/21    Time 3    Period Weeks    Status New               PT Long Term Goals - 07/24/21 1151       PT LONG TERM GOAL #1   Title Patient will be demonstrate a decrease in worse pain from 10/10 to 7/10 in order to demonstrate clinically significant reduction in pain.    Baseline 07/24/21 10/10    Time 6    Period Weeks    Status New    Target Date 09/04/21      PT LONG TERM GOAL #2   Title Patient will demonstrate gross bilat hip strength of 4+/5 in order to improve sit<>stand transfer and steadiness with gait.    Baseline 07/24/21: R:4-/5 L: 4/5    Time 6    Period Weeks    Status New    Target Date 09/04/21      PT LONG TERM GOAL #3   Title Patient will be able to comfortably sit for 45 minutes to be able to perform occupational tasks.    Baseline 07/24/21: less than 30 min    Time 8    Period Weeks    Status New    Target Date 09/04/21      PT LONG TERM GOAL #4   Title Pt will perform 5XSTS within 20 seconds in order to demonstrate improved LE strength and decrease the likelihood of falling.    Baseline 81/22: 30 sec    Time 6    Period Weeks    Status New    Target Date 09/04/21                   Plan - 08/30/21 0944     Clinical Impression Statement Pt tolerated session well with no reports of increase LBP following therex. PT session focused on LE strengthening, lumbar mobility, and core stabilization. Patient will continue to benefit from skilled therapy for exercise performance to ensure proper technique for safe and effective progression.  Continue PT POC as able.    Personal Factors and Comorbidities Age;Comorbidity 3+;Profession    Comorbidities MS, prior surgery, chronic low back pain, chronic neck pain, fibromyalgia    Examination-Activity Limitations Bed Mobility;Squat;Lift;Stairs;Bend;Sit;Sleep    Examination-Participation Restrictions Occupation;Cleaning;Personal Finances;Yard  Work;Community Activity    Stability/Clinical Decision Making Evolving/Moderate complexity    Clinical Decision Making Moderate    Rehab Potential Fair  PT Frequency 2x / week    PT Duration 6 weeks    PT Treatment/Interventions Therapeutic exercise;Therapeutic activities;Patient/family education;Passive range of motion;Manual techniques;Dry needling;Cryotherapy;Electrical Stimulation;Moist Heat;Gait training;Balance training;Neuromuscular re-education    PT Next Visit Plan Update; recheck HEP    PT Home Exercise Plan FWD flexion, sit to stands, seated hip abd    Consulted and Agree with Plan of Care Patient             Patient will benefit from skilled therapeutic intervention in order to improve the following deficits and impairments:  Pain, Hypomobility, Decreased strength, Decreased range of motion, Decreased endurance, Decreased activity tolerance, Increased edema, Abnormal gait, Impaired sensation, Improper body mechanics, Decreased mobility, Decreased balance, Decreased coordination  Visit Diagnosis: Chronic bilateral low back pain with bilateral sciatica     Problem List Patient Active Problem List   Diagnosis Date Noted   Type 2 diabetes mellitus with pressure callus ([place]) 03/20/2021   Diabetes mellitus type 2, diet-controlled ([place]) 03/20/2021   Onychomycosis of multiple toenails with type 2 diabetes mellitus ([place]) 03/20/2021   Chronic idiopathic constipation 03/20/2021   Lumbar foraminal stenosis 03/08/2021   Myalgia due to statin 05/26/2020   History of lumbar fusion 11/22/2019   GERD (gastroesophageal reflux disease) 04/28/2018   Pure hypercholesterolemia 04/28/2018   Diverticulosis 04/28/2018   RLS (restless legs syndrome) 04/28/2018   Angina pectoris ([place]) 04/28/2018   Vitamin D deficiency 12/27/2017   Chronic fatigue, unspecified 12/27/2017   Intractable migraine without aura and without status migrainosus 10/17/2015   DDD (degenerative disc disease),  cervical 05/23/2015   Bilateral occipital neuralgia 05/23/2015   DDD (degenerative disc disease), lumbar 05/23/2015   Multiple sclerosis ([place]) 05/23/2015   Cervical disc disorder with radiculopathy of cervical region 04/20/2015   Right arm numbness 04/20/2015   Right arm weakness 04/20/2015   Sharion Settler, SPT  Durwin Reges, PT 08/30/2021, 1:51 PM  [facility] [address] [place], [address] Phone: [phone]   Fax:  [phone]  Name: Ashlee Richardson MRN: [id number] Date of Birth: May 23, 1956

## 2021-08-30 NOTE — Progress Notes (Signed)
She presents today for follow-up visit of her first every other day Lamisil treatment.  She states that they are growing out she is very happy with it.  Has no problems taking the medication.  Objective: Vital signs are stable alert oriented x3 nails about 50% grown out.  Assessment: Well-healing onychomycosis.  Plan: Continue taking another 30 tablets of Lamisil 1 every other day she states that she already has these at home and I will follow-up with her in 3 months.

## 2021-08-31 ENCOUNTER — Other Ambulatory Visit: Payer: Self-pay | Admitting: Family Medicine

## 2021-08-31 DIAGNOSIS — E785 Hyperlipidemia, unspecified: Secondary | ICD-10-CM

## 2021-08-31 DIAGNOSIS — I209 Angina pectoris, unspecified: Secondary | ICD-10-CM

## 2021-08-31 NOTE — Telephone Encounter (Signed)
Requested Prescriptions  Pending Prescriptions Disp Refills  . rosuvastatin (CRESTOR) 20 MG tablet [Pharmacy Med Name: ROSUVASTATIN CALCIUM 20 MG TAB] 90 tablet 1    Sig: TAKE 1 TABLET BY MOUTH EVERY DAY     Cardiovascular:  Antilipid - Statins Passed - 08/31/2021  2:51 AM      Passed - Total Cholesterol in normal range and within 360 days    Cholesterol  Date Value Ref Range Status  09/20/2020 164 <200 mg/dL Final         Passed - LDL in normal range and within 360 days    LDL Cholesterol (Calc)  Date Value Ref Range Status  09/20/2020 63 mg/dL (calc) Final    Comment:    Reference range: <100 . Desirable range <100 mg/dL for primary prevention;   <70 mg/dL for patients with CHD or diabetic patients  with > or = 2 CHD risk factors. Marland Kitchen LDL-C is now calculated using the Martin-Hopkins  calculation, which is a validated novel method providing  better accuracy than the Friedewald equation in the  estimation of LDL-C.  Cresenciano Genre et al. Annamaria Helling. [id number]): 2061-2068  (http://education.[facility].com/faq/FAQ164)          Passed - HDL in normal range and within 360 days    HDL  Date Value Ref Range Status  09/20/2020 78 > OR = 50 mg/dL Final         Passed - Triglycerides in normal range and within 360 days    Triglycerides  Date Value Ref Range Status  09/20/2020 147 <150 mg/dL Final         Passed - Patient is not pregnant      Passed - Valid encounter within last 12 months    Recent Outpatient Visits          3 months ago COVID-19   [facility] Steele Sizer, MD   5 months ago Angina pectoris [facility])   [facility] Steele Sizer, MD   8 months ago RLS (restless legs syndrome)   [facility] Steele Sizer, MD   11 months ago Angina pectoris [facility])   [facility] Steele Sizer, MD   1 year ago Angina pectoris [facility])   [facility] Steele Sizer, MD       Future Appointments            In 3 weeks Steele Sizer, MD [facility] [place]   In 11 months  [facility]           . ezetimibe (ZETIA) 10 MG tablet [Pharmacy Med Name: EZETIMIBE 10 MG TABLET] 90 tablet 1    Sig: TAKE 1 TABLET BY MOUTH EVERY DAY     Cardiovascular:  Antilipid - Sterol Transport Inhibitors Passed - 08/31/2021  2:51 AM      Passed - Total Cholesterol in normal range and within 360 days    Cholesterol  Date Value Ref Range Status  09/20/2020 164 <200 mg/dL Final         Passed - LDL in normal range and within 360 days    LDL Cholesterol (Calc)  Date Value Ref Range Status  09/20/2020 63 mg/dL (calc) Final    Comment:    Reference range: <100 . Desirable range <100 mg/dL for primary prevention;   <70 mg/dL for patients with CHD or diabetic patients  with > or = 2 CHD risk factors. Marland Kitchen LDL-C is now calculated using  the Martin-Hopkins  calculation, which is a validated novel method providing  better accuracy than the Friedewald equation in the  estimation of LDL-C.  Cresenciano Genre et al. Annamaria Helling. [id number]): 2061-2068  (http://education.[facility].com/faq/FAQ164)          Passed - HDL in normal range and within 360 days    HDL  Date Value Ref Range Status  09/20/2020 78 > OR = 50 mg/dL Final         Passed - Triglycerides in normal range and within 360 days    Triglycerides  Date Value Ref Range Status  09/20/2020 147 <150 mg/dL Final         Passed - Valid encounter within last 12 months    Recent Outpatient Visits          3 months ago [facility] Steele Sizer, MD   5 months ago Angina pectoris [facility])   [facility] Steele Sizer, MD   8 months ago RLS (restless legs syndrome)   [facility] Steele Sizer, MD   11 months ago Angina pectoris [facility])   [facility] Steele Sizer, MD   1 year ago Angina  pectoris [facility])   [facility] Steele Sizer, MD      Future Appointments            In 3 weeks Ancil Boozer, Drue Stager, MD [facility], [place]   In 11 months  [facility]

## 2021-09-04 ENCOUNTER — Ambulatory Visit: Payer: Medicare Other | Admitting: Physical Therapy

## 2021-09-04 ENCOUNTER — Encounter: Payer: Self-pay | Admitting: Physical Therapy

## 2021-09-04 DIAGNOSIS — M5442 Lumbago with sciatica, left side: Secondary | ICD-10-CM | POA: Diagnosis not present

## 2021-09-04 DIAGNOSIS — M5441 Lumbago with sciatica, right side: Secondary | ICD-10-CM | POA: Diagnosis not present

## 2021-09-04 DIAGNOSIS — G8929 Other chronic pain: Secondary | ICD-10-CM | POA: Diagnosis not present

## 2021-09-04 NOTE — Therapy (Signed)
[place] [facility] [address] [place], [address] Phone: [phone]   Fax:  [phone]  Physical Therapy Treatment Progress Note Reporting Period 07/24/2021-09/04/21  Patient Details  Name: Ashlee Richardson MRN: [id number] Date of Birth: 04-22-1956 Referring Provider (PT): Lorin Mercy MD   Encounter Date: 09/04/2021   PT End of Session - 09/04/21 0912     Visit Number 10    Number of Visits 17    Date for PT Re-Evaluation 09/22/21    Authorization Type UHC Medicare    PT Start Time 0900    PT Stop Time 0945    PT Time Calculation (min) 45 min    Equipment Utilized During Treatment Gait belt    Activity Tolerance Patient tolerated treatment well;Patient limited by pain    Behavior During Therapy [facility] for tasks assessed/performed             Past Medical History:  Diagnosis Date   B12 deficiency 12/27/2017   Diverticulitis    Fibromyalgia    GERD (gastroesophageal reflux disease)    High cholesterol    Migraine    MS (multiple sclerosis) ([place])    PUD (peptic ulcer disease) 04/28/2018    Past Surgical History:  Procedure Laterality Date   [place]?   Pt usure of date   BREAST BIOPSY Right    COLONOSCOPY  09/2007   COLONOSCOPY WITH PROPOFOL N/A 08/28/2017   Procedure: COLONOSCOPY WITH PROPOFOL;  Surgeon: Christene Lye, MD;  Location: [facility] ENDOSCOPY;  Service: Endoscopy;  Laterality: N/A;   ESOPHAGOGASTRODUODENOSCOPY (EGD) WITH PROPOFOL N/A 11/24/2018   Procedure: ESOPHAGOGASTRODUODENOSCOPY (EGD) WITH PROPOFOL;  Surgeon: Lin Landsman, MD;  [facility] END[facility]COPY;  Service: Gastroenterology;  Laterality: N/A;   Foot sugery Right    3rd toe from the right"    There were no vitals filed for this visit.   Subjective Assessment - 09/04/21 0904     Subjective Pt reports feeling well today with pain only 6/10 on NPRS. She continues to report she has a "catch" in her L hip that is  bothering her. Pt reports feeling that her back pain has improved over all since therapy but her pain is increased today due to her prescription medicines have ran oun.    Pertinent History Ashlee Richardson is a 65 year old female with c/o bilateral LBP with radiating symptoms down R LE. She reports having long standing back pain that limits her ability sit <> stand transfer, walk on treadmill, and perform light housework. Patient also reports L sided neck pain that radiates to L arm. Pt reports her back pain also interfers with her sleep. She reports taking meloxicam to help reduce her LBP. 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 Patient says that physical therapy must happen prior to eligibility for low back surgery. Patient denies having cancer or any unexplained weigh loss. She reports having incontinence but that it is controlled by medication. She also reports having a fall within the last 6 months. Pt has a PMH of previous lumbar fusion L4-S1.    Limitations Sitting;Lifting;Standing;Walking    How long can you sit comfortably? 30 min    How long can you stand comfortably? 30 min    How long can you walk comfortably? 2 miles            Therex   Recumbent Bike L1x68mn for gentle strengthening  OMEGA Leg press 3 x 10 reps # 55   Treadmill Walking 2.0-2.5MPH x 5 min on incline elevated to 2 to increase lumbar flexion bias to decrease LBP and increase LE and cardiovascular endurance  Seated FWD flexion and sidebending on physioball 2 x 12 reps   Seated trunk rotation 2 x 10reps   SKTC 2 x 30 sec  Lower trunk Rotation 1 x 10 reps with 5 sec hold  *Progress note performed this session. See notes and clinical impression statement for details on goals.                        PT Education - 09/04/21 0955     Education Details Pt educated on progress to date.    Person(s) Educated  Patient    Methods Explanation    Comprehension Verbalized understanding              PT Short Term Goals - 09/04/21 0913       PT SHORT TERM GOAL #1   Title Pt will be independent with HEP in order to self-manage LBP.    Baseline 07/24/21: HEP Given    Time 3    Period Weeks    Status Achieved               PT Long Term Goals - 09/04/21 0914       PT LONG TERM GOAL #1   Title Patient will be demonstrate a decrease in worse pain from 10/10 to 7/10 in order to demonstrate clinically significant reduction in pain.    Baseline 07/24/21 10/10; 09/04/21: 6/10    Time 6    Period Weeks    Status Achieved    Target Date 09/04/21      PT LONG TERM GOAL #2   Title Patient will demonstrate gross bilat hip strength of 4+/5 in order to improve sit<>stand transfer and steadiness with gait.    Baseline 07/24/21: R:4-/5 L: 4/5 09/04/21:  R:4-/5 L: 4/5    Time 6    Period Weeks    Status On-going    Target Date 09/04/21      PT LONG TERM GOAL #3   Title Patient will be able to comfortably sit for 45 minutes to be able to perform occupational tasks.    Baseline 07/24/21: less than 30 min 09/01/21: 30 minutes    Time 8    Period Weeks    Status On-going    Target Date 09/04/21      PT LONG TERM GOAL #4   Title Pt will perform 5XSTS within 20 seconds in order to demonstrate improved LE strength and decrease the likelihood of falling.    Baseline 81/22: 30 sec 09/04/21 22 sec    Time 6    Period Weeks    Status On-going    Target Date 09/04/21                   Plan - 09/04/21 0942     Clinical Impression Statement PT progress note performed this day. Pt session limited today due to increased back pain, however patient reported improvement in worst pain score, ability to sit comfortably for 30 minutes, and demonstrated increased performance with 5xSTS. PT unable to receive consistent MMT mearsurements d/t increase LBP.  PT to continue focus on LE strength and core stability  training. Continue PT POC.    Personal Factors and Comorbidities Age;Comorbidity 3+;Profession    Comorbidities MS,  prior surgery, chronic low back pain, chronic neck pain, fibromyalgia    Examination-Activity Limitations Bed Mobility;Squat;Lift;Stairs;Bend;Sit;Sleep    Examination-Participation Restrictions Occupation;Cleaning;Personal Finances;Yard Work;Community Activity    Stability/Clinical Decision Making Evolving/Moderate complexity    Clinical Decision Making Moderate    Rehab Potential Fair    PT Frequency 2x / week    PT Duration 6 weeks    PT Treatment/Interventions Therapeutic exercise;Therapeutic activities;Patient/family education;Passive range of motion;Manual techniques;Dry needling;Cryotherapy;Electrical Stimulation;Moist Heat;Gait training;Balance training;Neuromuscular re-education    PT Next Visit Plan Update; recheck HEP    PT Home Exercise Plan FWD flexion, sit to stands, seated hip abd    Consulted and Agree with Plan of Care Patient             Patient will benefit from skilled therapeutic intervention in order to improve the following deficits and impairments:  Pain, Hypomobility, Decreased strength, Decreased range of motion, Decreased endurance, Decreased activity tolerance, Increased edema, Abnormal gait, Impaired sensation, Improper body mechanics, Decreased mobility, Decreased balance, Decreased coordination  Visit Diagnosis: Chronic bilateral low back pain with bilateral sciatica     Problem List Patient Active Problem List   Diagnosis Date Noted   Type 2 diabetes mellitus with pressure callus ([place]) 03/20/2021   Diabetes mellitus type 2, diet-controlled ([place]) 03/20/2021   Onychomycosis of multiple toenails with type 2 diabetes mellitus ([place]) 03/20/2021   Chronic idiopathic constipation 03/20/2021   Lumbar foraminal stenosis 03/08/2021   Myalgia due to statin 05/26/2020   History of lumbar fusion 11/22/2019   GERD (gastroesophageal reflux disease)  04/28/2018   Pure hypercholesterolemia 04/28/2018   Diverticulosis 04/28/2018   RLS (restless legs syndrome) 04/28/2018   Angina pectoris ([place]) 04/28/2018   Vitamin D deficiency 12/27/2017   Chronic fatigue, unspecified 12/27/2017   Intractable migraine without aura and without status migrainosus 10/17/2015   DDD (degenerative disc disease), cervical 05/23/2015   Bilateral occipital neuralgia 05/23/2015   DDD (degenerative disc disease), lumbar 05/23/2015   Multiple sclerosis ([place]) 05/23/2015   Cervical disc disorder with radiculopathy of cervical region 04/20/2015   Right arm numbness 04/20/2015   Right arm weakness 04/20/2015     Durwin Reges DPT Sharion Settler, SPT  Durwin Reges, PT 09/05/2021, 7:35 AM  [place] [facility] [address], [place], [address] Phone: [phone]   Fax:  [phone]  Name: Ashlee Richardson MRN: [id number] Date of Birth: 12/23/1956

## 2021-09-06 ENCOUNTER — Ambulatory Visit: Payer: Medicare Other | Admitting: Physical Therapy

## 2021-09-11 ENCOUNTER — Encounter: Payer: Self-pay | Admitting: Physical Therapy

## 2021-09-11 ENCOUNTER — Ambulatory Visit: Payer: Medicare Other

## 2021-09-11 DIAGNOSIS — M5442 Lumbago with sciatica, left side: Secondary | ICD-10-CM | POA: Diagnosis not present

## 2021-09-11 DIAGNOSIS — M5441 Lumbago with sciatica, right side: Secondary | ICD-10-CM

## 2021-09-11 DIAGNOSIS — G8929 Other chronic pain: Secondary | ICD-10-CM | POA: Diagnosis not present

## 2021-09-11 NOTE — Therapy (Signed)
[place] [facility] [address], [place], [address] Phone: [phone]   Fax:  [phone]  Physical Therapy Treatment  Patient Details  Name: Ashlee Richardson MRN: [id number] Date of Birth: 07/30/1956 Referring Provider (PT): Lorin Mercy MD   Encounter Date: 09/11/2021   PT End of Session - 09/11/21 1031     Visit Number 11    Number of Visits 17    Date for PT Re-Evaluation 09/22/21    Authorization Type UHC Medicare    Authorization Time Period 03/14/21-06/06/21    PT Start Time 1025    PT Stop Time 1103    PT Time Calculation (min) 38 min    Activity Tolerance Patient tolerated treatment well;Patient limited by pain    Behavior During Therapy [facility] for tasks assessed/performed             Past Medical History:  Diagnosis Date   B12 deficiency 12/27/2017   Diverticulitis    Fibromyalgia    GERD (gastroesophageal reflux disease)    High cholesterol    Migraine    MS (multiple sclerosis) ([place])    PUD (peptic ulcer disease) 04/28/2018    Past Surgical History:  Procedure Laterality Date   [place]?   Pt usure of date   BREAST BIOPSY Right    COLONOSCOPY  09/2007   COLONOSCOPY WITH PROPOFOL N/A 08/28/2017   Procedure: COLONOSCOPY WITH PROPOFOL;  Surgeon: Christene Lye, MD;  Location: [facility] ENDOSCOPY;  Service: Endoscopy;  Laterality: N/A;   ESOPHAGOGASTRODUODENOSCOPY (EGD) WITH PROPOFOL N/A 11/24/2018   Procedure: ESOPHAGOGASTRODUODENOSCOPY (EGD) WITH PROPOFOL;  Surgeon: Lin Landsman, MD;  Location: [facility] ENDOSCOPY;  Service: Gastroenterology;  Laterality: N/A;   Foot sugery Right    3rd toe from the right"    There were no vitals filed for this visit.   Subjective Assessment - 09/11/21 1025     Subjective Pt reports her back pain is 8/10 this morning after cleaning her home. She reports not taking her pain medication today. She also thinks her MS has been "acting up."    Pertinent  History Ashlee Richardson is a 65 year old female with c/o bilateral LBP with radiating symptoms down R LE. She reports having long standing back pain that limits her ability sit <> stand transfer, walk on treadmill, and perform light housework. Patient also reports L sided neck pain that radiates to L arm. Pt reports her back pain also interfers with her sleep. She reports taking meloxicam to help reduce her LBP. 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 Patient says that physical therapy must happen prior to eligibility for low back surgery. Patient denies having cancer or any unexplained weigh loss. She reports having incontinence but that it is controlled by medication. She also reports having a fall within the last 6 months. Pt has a PMH of previous lumbar fusion L4-S1.    Limitations Sitting;Lifting;Standing;Walking    How long can you sit comfortably? 30 min    How long can you stand comfortably? 30 min    How long can you walk comfortably? 2 miles    Patient Stated Goals To return to physical activity and gym exercises             Therex    Nu Step L3 x 93min for gentle strengthening with UE/LE use   OMEGA Leg press 3 x 10 reps #  45    Treadmill Walking 2.0-2.5MPH x 5 min on incline elevated to 2 to increase lumbar flexion bias to decrease LBP and increase LE and cardiovascular endurance   Seated FWD flexion and sidebending on physioball 2 x 12 reps/direction   DKTC 2 x 30 sec   SKTC 2 x 30 sec   Lower trunk Rotation 1 x 10 reps with 5 sec hold     PT Education - 09/11/21 1032     Education Details therex form/technique    Person(s) Educated Patient    Methods Explanation    Comprehension Verbalized understanding;Returned demonstration              PT Short Term Goals - 09/04/21 0913       PT SHORT TERM GOAL #1   Title Pt will be independent with HEP in order to self-manage LBP.     Baseline 07/24/21: HEP Given    Time 3    Period Weeks    Status Achieved               PT Long Term Goals - 09/04/21 0914       PT LONG TERM GOAL #1   Title Patient will be demonstrate a decrease in worse pain from 10/10 to 7/10 in order to demonstrate clinically significant reduction in pain.    Baseline 07/24/21 10/10; 09/04/21: 6/10    Time 6    Period Weeks    Status Achieved    Target Date 09/04/21      PT LONG TERM GOAL #2   Title Patient will demonstrate gross bilat hip strength of 4+/5 in order to improve sit<>stand transfer and steadiness with gait.    Baseline 07/24/21: R:4-/5 L: 4/5 09/04/21:  R:4-/5 L: 4/5    Time 6    Period Weeks    Status On-going    Target Date 09/04/21      PT LONG TERM GOAL #3   Title Patient will be able to comfortably sit for 45 minutes to be able to perform occupational tasks.    Baseline 07/24/21: less than 30 min 09/01/21: 30 minutes    Time 8    Period Weeks    Status On-going    Target Date 09/04/21      PT LONG TERM GOAL #4   Title Pt will perform 5XSTS within 20 seconds in order to demonstrate improved LE strength and decrease the likelihood of falling.    Baseline 81/22: 30 sec 09/04/21 22 sec    Time 6    Period Weeks    Status On-going    Target Date 09/04/21                   Plan - 09/11/21 1103     Clinical Impression Statement PT continued LE strengthening and flexion biased exercise on treadmill and repeated motions in which patient reported improvement in LBP. Pt does however continues to be limited by pain. Patient will continue to benefit from skilled therapy for exercise performance to ensure proper technique for safe and effective progression.  Continue PT POC as able.    Personal Factors and Comorbidities Age;Comorbidity 3+;Profession    Comorbidities MS, prior surgery, chronic low back pain, chronic neck pain, fibromyalgia    Examination-Activity Limitations Bed Mobility;Squat;Lift;Stairs;Bend;Sit;Sleep     Examination-Participation Restrictions Occupation;Cleaning;Personal Finances;Yard Work;Community Activity    Stability/Clinical Decision Making Evolving/Moderate complexity    Clinical Decision Making Moderate    Rehab Potential Fair  PT Frequency 2x / week    PT Duration 6 weeks    PT Treatment/Interventions Therapeutic exercise;Therapeutic activities;Patient/family education;Passive range of motion;Manual techniques;Dry needling;Cryotherapy;Electrical Stimulation;Moist Heat;Gait training;Balance training;Neuromuscular re-education    PT Next Visit Plan Update; recheck HEP    PT Home Exercise Plan FWD flexion, sit to stands, seated hip abd    Consulted and Agree with Plan of Care Patient             Patient will benefit from skilled therapeutic intervention in order to improve the following deficits and impairments:  Pain, Hypomobility, Decreased strength, Decreased range of motion, Decreased endurance, Decreased activity tolerance, Increased edema, Abnormal gait, Impaired sensation, Improper body mechanics, Decreased mobility, Decreased balance, Decreased coordination  Visit Diagnosis: Chronic bilateral low back pain with bilateral sciatica     Problem List Patient Active Problem List   Diagnosis Date Noted   Type 2 diabetes mellitus with pressure callus ([place]) 03/20/2021   Diabetes mellitus type 2, diet-controlled ([place]) 03/20/2021   Onychomycosis of multiple toenails with type 2 diabetes mellitus ([place]) 03/20/2021   Chronic idiopathic constipation 03/20/2021   Lumbar foraminal stenosis 03/08/2021   Myalgia due to statin 05/26/2020   History of lumbar fusion 11/22/2019   GERD (gastroesophageal reflux disease) 04/28/2018   Pure hypercholesterolemia 04/28/2018   Diverticulosis 04/28/2018   RLS (restless legs syndrome) 04/28/2018   Angina pectoris ([place]) 04/28/2018   Vitamin D deficiency 12/27/2017   Chronic fatigue, unspecified 12/27/2017   Intractable migraine without aura  and without status migrainosus 10/17/2015   DDD (degenerative disc disease), cervical 05/23/2015   Bilateral occipital neuralgia 05/23/2015   DDD (degenerative disc disease), lumbar 05/23/2015   Multiple sclerosis ([place]) 05/23/2015   Cervical disc disorder with radiculopathy of cervical region 04/20/2015   Right arm numbness 04/20/2015   Right arm weakness 04/20/2015   Sharion Settler, SPT   Salem Caster. Fairly IV, PT, DPT Physical Therapist- [facility]  09/11/2021, 11:20 AM  [place] [facility] [address], [place], [address] Phone: [phone]   Fax:  [phone]  Name: Ashlee Richardson MRN: [id number] Date of Birth: 1956-04-28

## 2021-09-12 DIAGNOSIS — R0602 Shortness of breath: Secondary | ICD-10-CM | POA: Diagnosis not present

## 2021-09-12 DIAGNOSIS — I208 Other forms of angina pectoris: Secondary | ICD-10-CM | POA: Diagnosis not present

## 2021-09-13 ENCOUNTER — Ambulatory Visit: Payer: Medicare Other

## 2021-09-13 ENCOUNTER — Encounter: Payer: Self-pay | Admitting: Physical Therapy

## 2021-09-13 DIAGNOSIS — M5441 Lumbago with sciatica, right side: Secondary | ICD-10-CM | POA: Diagnosis not present

## 2021-09-13 DIAGNOSIS — G8929 Other chronic pain: Secondary | ICD-10-CM

## 2021-09-13 DIAGNOSIS — M5442 Lumbago with sciatica, left side: Secondary | ICD-10-CM | POA: Diagnosis not present

## 2021-09-13 NOTE — Therapy (Signed)
[facility] [address], [place], [address] Phone: [phone]   Fax:  [phone]  Physical Therapy Treatment  Patient Details  Name: Ashlee Richardson MRN: [id number] Date of Birth: 12-Feb-1956 Referring Provider (PT): Lorin Mercy MD   Encounter Date: 09/13/2021   PT End of Session - 09/13/21 0951     Visit Number 12    Number of Visits 17    Date for PT Re-Evaluation 09/22/21    Authorization Type UHC Medicare    PT Start Time 0946    PT Stop Time 1026    PT Time Calculation (min) 40 min    Activity Tolerance Patient tolerated treatment well;Patient limited by pain    Behavior During Therapy Ashlee Richardson for tasks assessed/performed             Past Medical History:  Diagnosis Date   B12 deficiency 12/27/2017   Diverticulitis    Fibromyalgia    GERD (gastroesophageal reflux disease)    High cholesterol    Migraine    MS (multiple sclerosis) ([place])    PUD (peptic ulcer disease) 04/28/2018    Past Surgical History:  Procedure Laterality Date   [place]?   Pt usure of date   BREAST BIOPSY Right    COLONOSCOPY  09/2007   COLONOSCOPY WITH PROPOFOL N/A 08/28/2017   Procedure: COLONOSCOPY WITH PROPOFOL;  Surgeon: Christene Lye, MD;  Location: [facility] ENDOSCOPY;  Service: Endoscopy;  Laterality: N/A;   ESOPHAGOGASTRODUODENOSCOPY (EGD) WITH PROPOFOL N/A 11/24/2018   Procedure: ESOPHAGOGASTRODUODENOSCOPY (EGD) WITH PROPOFOL;  Surgeon: Lin Landsman, MD;  [facility] END[facility]COPY;  Service: Gastroenterology;  Laterality: N/A;   Foot sugery Right    3rd toe from the right"    There were no vitals filed for this visit.   Subjective Assessment - 09/13/21 0949     Subjective Pt reports having a stress test yesterday and feeling tired today. She reports her back pain is a 3/10 today d/t taking her pain medication.    Pertinent History Ashlee Richardson is a 65 year old female with c/o bilateral LBP  with radiating symptoms down R LE. She reports having long standing back pain that limits her ability sit <> stand transfer, walk on treadmill, and perform light housework. Patient also reports L sided neck pain that radiates to L arm. Pt reports her back pain also interfers with her sleep. She reports taking meloxicam to help reduce her LBP. 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 Patient says that physical therapy must happen prior to eligibility for low back surgery. Patient denies having cancer or any unexplained weigh loss. She reports having incontinence but that it is controlled by medication. She also reports having a fall within the last 6 months. Pt has a PMH of previous lumbar fusion L4-S1.    Limitations Sitting;Lifting;Standing;Walking    How long can you sit comfortably? 30 min    How long can you stand comfortably? 30 min    How long can you walk comfortably? 2 miles             Therex    Nu Step L4 x 27min for gentle strengthening with UE/LE use   Matrix Standing Hip ABD 3 x 10 reps #40  TRX Squats 2 x 10 reps with cues for depth and feet placement   Modified Bird Dog 1 x 6 reps with cueing  to alt UE/LE  Lateral step downs 1 x 8 reps with emphasis on eccentric control  Glute Stretch 3 x 30sec BLE with PT assist with overpressure    Seated FWD flexion and sidebending on physioball 2 x 12 reps/direction    DKTC 2 x 30 sec   SKTC 2 x 30 sec   Lower trunk Rotation 1 x 10 reps with 5 sec hold    PT Education - 09/13/21 1033     Education Details Pt educated on energy conservation if experiencing MS flare or weakness.    Person(s) Educated Patient    Methods Explanation    Comprehension Verbalized understanding              PT Short Term Goals - 09/04/21 0913       PT SHORT TERM GOAL #1   Title Pt will be independent with HEP in order to self-manage LBP.    Baseline  07/24/21: HEP Given    Time 3    Period Weeks    Status Achieved               PT Long Term Goals - 09/04/21 0914       PT LONG TERM GOAL #1   Title Patient will be demonstrate a decrease in worse pain from 10/10 to 7/10 in order to demonstrate clinically significant reduction in pain.    Baseline 07/24/21 10/10; 09/04/21: 6/10    Time 6    Period Weeks    Status Achieved    Target Date 09/04/21      PT LONG TERM GOAL #2   Title Patient will demonstrate gross bilat hip strength of 4+/5 in order to improve sit<>stand transfer and steadiness with gait.    Baseline 07/24/21: R:4-/5 L: 4/5 09/04/21:  R:4-/5 L: 4/5    Time 6    Period Weeks    Status On-going    Target Date 09/04/21      PT LONG TERM GOAL #3   Title Patient will be able to comfortably sit for 45 minutes to be able to perform occupational tasks.    Baseline 07/24/21: less than 30 min 09/01/21: 30 minutes    Time 8    Period Weeks    Status On-going    Target Date 09/04/21      PT LONG TERM GOAL #4   Title Pt will perform 5XSTS within 20 seconds in order to demonstrate improved LE strength and decrease the likelihood of falling.    Baseline 81/22: 30 sec 09/04/21 22 sec    Time 6    Period Weeks    Status On-going    Target Date 09/04/21                   Plan - 09/13/21 1028     Clinical Impression Statement Pt tolerated session well with reports of decrease in pain following therapeutic exercise despite reports of feeling of weakness in LE that she attribute to MS flare. PT focused on LE/core strengthening and lumbar mobility this session. Patient will continue to benefit from skilled therapy for exercise performance to ensure proper technique for safe and effective progression. Continue PT POC as able.    Personal Factors and Comorbidities Age;Comorbidity 3+;Profession    Comorbidities MS, prior surgery, chronic low back pain, chronic neck pain, fibromyalgia    Examination-Activity Limitations Bed  Mobility;Squat;Lift;Stairs;Bend;Sit;Sleep    Examination-Participation Restrictions Occupation;Cleaning;Personal Finances;Yard Work;Community Activity    Stability/Clinical Decision Making Evolving/Moderate complexity  Clinical Decision Making Moderate    Rehab Potential Fair    PT Frequency 2x / week    PT Duration 6 weeks    PT Treatment/Interventions Therapeutic exercise;Therapeutic activities;Patient/family education;Passive range of motion;Manual techniques;Dry needling;Cryotherapy;Electrical Stimulation;Moist Heat;Gait training;Balance training;Neuromuscular re-education    PT Next Visit Plan Update; recheck HEP    PT Home Exercise Plan FWD flexion, sit to stands, seated hip abd    Consulted and Agree with Plan of Care Patient             Patient will benefit from skilled therapeutic intervention in order to improve the following deficits and impairments:  Pain, Hypomobility, Decreased strength, Decreased range of motion, Decreased endurance, Decreased activity tolerance, Increased edema, Abnormal gait, Impaired sensation, Improper body mechanics, Decreased mobility, Decreased balance, Decreased coordination  Visit Diagnosis: Chronic bilateral low back pain with bilateral sciatica     Problem List Patient Active Problem List   Diagnosis Date Noted   Type 2 diabetes mellitus with pressure callus ([place]) 03/20/2021   Diabetes mellitus type 2, diet-controlled ([place]) 03/20/2021   Onychomycosis of multiple toenails with type 2 diabetes mellitus ([place]) 03/20/2021   Chronic idiopathic constipation 03/20/2021   Lumbar foraminal stenosis 03/08/2021   Myalgia due to statin 05/26/2020   History of lumbar fusion 11/22/2019   GERD (gastroesophageal reflux disease) 04/28/2018   Pure hypercholesterolemia 04/28/2018   Diverticulosis 04/28/2018   RLS (restless legs syndrome) 04/28/2018   Angina pectoris ([place]) 04/28/2018   Vitamin D deficiency 12/27/2017   Chronic fatigue, unspecified  12/27/2017   Intractable migraine without aura and without status migrainosus 10/17/2015   DDD (degenerative disc disease), cervical 05/23/2015   Bilateral occipital neuralgia 05/23/2015   DDD (degenerative disc disease), lumbar 05/23/2015   Multiple sclerosis ([place]) 05/23/2015   Cervical disc disorder with radiculopathy of cervical region 04/20/2015   Right arm numbness 04/20/2015   Right arm weakness 04/20/2015   Sharion Settler, SPT   Salem Caster. Fairly IV, PT, DPT Physical Therapist- [facility]  09/13/2021, 11:46 AM  [place] [facility] [address], [place], [address] Phone: [phone]   Fax:  [phone]  Name: LAWONDA PRETLOW MRN: [id number] Date of Birth: [birth year]

## 2021-09-18 ENCOUNTER — Ambulatory Visit: Payer: Medicare Other | Admitting: Physical Therapy

## 2021-09-19 ENCOUNTER — Ambulatory Visit: Payer: Medicare Other | Admitting: Physical Therapy

## 2021-09-19 ENCOUNTER — Encounter: Payer: Self-pay | Admitting: Physical Therapy

## 2021-09-19 DIAGNOSIS — G8929 Other chronic pain: Secondary | ICD-10-CM | POA: Diagnosis not present

## 2021-09-19 DIAGNOSIS — M5441 Lumbago with sciatica, right side: Secondary | ICD-10-CM | POA: Diagnosis not present

## 2021-09-19 DIAGNOSIS — M5442 Lumbago with sciatica, left side: Secondary | ICD-10-CM | POA: Diagnosis not present

## 2021-09-19 NOTE — Therapy (Signed)
[facility] [address], [place], [address] Phone: [phone]   Fax:  [phone]  Physical Therapy Treatment  Patient Details  Name: Ashlee Richardson MRN: [id number] Date of Birth: 1956-08-13 Referring Provider (PT): Lorin Mercy MD   Encounter Date: 09/19/2021   PT End of Session - 09/19/21 0740     Visit Number 13    Number of Visits 17    Date for PT Re-Evaluation 09/22/21    Authorization Type UHC Medicare    PT Start Time 0734    PT Stop Time 0815    PT Time Calculation (min) 41 min    Equipment Utilized During Treatment Gait belt    Activity Tolerance Patient tolerated treatment well;No increased pain    Behavior During Therapy [facility] for tasks assessed/performed             Past Medical History:  Diagnosis Date   B12 deficiency 12/27/2017   Diverticulitis    Fibromyalgia    GERD (gastroesophageal reflux disease)    High cholesterol    Migraine    MS (multiple sclerosis) ([place])    PUD (peptic ulcer disease) 04/28/2018    Past Surgical History:  Procedure Laterality Date   BACK SURGERY  1991?   Pt usure of date   BREAST BIOPSY Right    COLONOSCOPY  09/2007   COLONOSCOPY WITH PROPOFOL N/A 08/28/2017   Procedure: COLONOSCOPY WITH PROPOFOL;  Surgeon: Christene Lye, MD;  Location: [facility] ENDOSCOPY;  Service: Endoscopy;  Laterality: N/A;   ESOPHAGOGASTRODUODENOSCOPY (EGD) WITH PROPOFOL N/A 11/24/2018   Procedure: ESOPHAGOGASTRODUODENOSCOPY (EGD) WITH PROPOFOL;  Surgeon: Lin Landsman, MD;  Location: [facility] ENDOSCOPY;  Service: Gastroenterology;  Laterality: N/A;   Foot sugery Right    3rd toe from the right"    There were no vitals filed for this visit.   Subjective Assessment - 09/19/21 0736     Subjective Pt reports taking new medication (lyrica and melixcam) and is feeling better today. She reports her back pain is a 1/10 on NPRS.    Pertinent History Ashlee Richardson is a 65 year  old female with c/o bilateral LBP with radiating symptoms down R LE. She reports having long standing back pain that limits her ability sit <> stand transfer, walk on treadmill, and perform light housework. Patient also reports L sided neck pain that radiates to L arm. Pt reports her back pain also interfers with her sleep. She reports taking meloxicam to help reduce her LBP. 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 Patient says that physical therapy must happen prior to eligibility for low back surgery. Patient denies having cancer or any unexplained weigh loss. She reports having incontinence but that it is controlled by medication. She also reports having a fall within the last 6 months. Pt has a PMH of previous lumbar fusion L4-S1.    Limitations Sitting;Lifting;Standing;Walking    How long can you sit comfortably? 30 min    How long can you stand comfortably? 30 min    How long can you walk comfortably? 2 miles            Therex    Nu Step L3 x 52min for gentle strengthening with LE use   Total Gym Squats 2 x 10 reps with cues for depth and feet placement    Lateral step downs 1 x 10 reps with emphasis on eccentric control  Glute Bridge  2 x 10 reps with emphasis on core activation   Side Lying Hip ADBuction 2 x 10 reps each side with PT assist to maintain position   Seated FWD flexion and sidebending on physioball 2 x 12 reps/direction   Modified Dead Bug 2 x 6 reps with pt cueing to initiate set up    DKTC 3 x 30 sec   Lower trunk Rotation 1 x 10 reps with 5 sec hold                              PT Education - 09/19/21 0739     Education Details therex form/technique    Person(s) Educated Patient    Methods Explanation;Demonstration    Comprehension Verbalized understanding;Returned demonstration              PT Short Term Goals - 09/04/21 0913       PT  SHORT TERM GOAL #1   Title Pt will be independent with HEP in order to self-manage LBP.    Baseline 07/24/21: HEP Given    Time 3    Period Weeks    Status Achieved               PT Long Term Goals - 09/04/21 0914       PT LONG TERM GOAL #1   Title Patient will be demonstrate a decrease in worse pain from 10/10 to 7/10 in order to demonstrate clinically significant reduction in pain.    Baseline 07/24/21 10/10; 09/04/21: 6/10    Time 6    Period Weeks    Status Achieved    Target Date 09/04/21      PT LONG TERM GOAL #2   Title Patient will demonstrate gross bilat hip strength of 4+/5 in order to improve sit<>stand transfer and steadiness with gait.    Baseline 07/24/21: R:4-/5 L: 4/5 09/04/21:  R:4-/5 L: 4/5    Time 6    Period Weeks    Status On-going    Target Date 09/04/21      PT LONG TERM GOAL #3   Title Patient will be able to comfortably sit for 45 minutes to be able to perform occupational tasks.    Baseline 07/24/21: less than 30 min 09/01/21: 30 minutes    Time 8    Period Weeks    Status On-going    Target Date 09/04/21      PT LONG TERM GOAL #4   Title Pt will perform 5XSTS within 20 seconds in order to demonstrate improved LE strength and decrease the likelihood of falling.    Baseline 81/22: 30 sec 09/04/21 22 sec    Time 6    Period Weeks    Status On-going    Target Date 09/04/21                   Plan - 09/19/21 0746     Clinical Impression Statement Pt tolerated session well with no reports of increased pain throughout session. PT continued therex progression of LE/core strengthening and lumbar mobility. Pt improved exercise performance this session with minimal cueing needed. PT plan to discharge next session.    Personal Factors and Comorbidities Age;Comorbidity 3+;Profession    Comorbidities MS, prior surgery, chronic low back pain, chronic neck pain, fibromyalgia    Examination-Activity Limitations Bed Mobility;Squat;Lift;Stairs;Bend;Sit;Sleep     Examination-Participation Restrictions Occupation;Cleaning;Personal Finances;Yard Work;Community Activity    Stability/Clinical Decision  Making Evolving/Moderate complexity    Clinical Decision Making Moderate    Rehab Potential Fair    PT Frequency 2x / week    PT Duration 6 weeks    PT Treatment/Interventions Therapeutic exercise;Therapeutic activities;Patient/family education;Passive range of motion;Manual techniques;Dry needling;Cryotherapy;Electrical Stimulation;Moist Heat;Gait training;Balance training;Neuromuscular re-education    PT Next Visit Plan Update; recheck HEP    PT Home Exercise Plan FWD flexion, sit to stands, seated hip abd    Consulted and Agree with Plan of Care Patient             Patient will benefit from skilled therapeutic intervention in order to improve the following deficits and impairments:  Pain, Hypomobility, Decreased strength, Decreased range of motion, Decreased endurance, Decreased activity tolerance, Increased edema, Abnormal gait, Impaired sensation, Improper body mechanics, Decreased mobility, Decreased balance, Decreased coordination  Visit Diagnosis: Chronic bilateral low back pain with bilateral sciatica     Problem List Patient Active Problem List   Diagnosis Date Noted   Type 2 diabetes mellitus with pressure callus ([place]) 03/20/2021   Diabetes mellitus type 2, diet-controlled ([place]) 03/20/2021   Onychomycosis of multiple toenails with type 2 diabetes mellitus ([place]) 03/20/2021   Chronic idiopathic constipation 03/20/2021   Lumbar foraminal stenosis 03/08/2021   Myalgia due to statin 05/26/2020   History of lumbar fusion 11/22/2019   GERD (gastroesophageal reflux disease) 04/28/2018   Pure hypercholesterolemia 04/28/2018   Diverticulosis 04/28/2018   RLS (restless legs syndrome) 04/28/2018   Angina pectoris ([place]) 04/28/2018   Vitamin D deficiency 12/27/2017   Chronic fatigue, unspecified 12/27/2017   Intractable migraine without  aura and without status migrainosus 10/17/2015   DDD (degenerative disc disease), cervical 05/23/2015   Bilateral occipital neuralgia 05/23/2015   DDD (degenerative disc disease), lumbar 05/23/2015   Multiple sclerosis ([place]) 05/23/2015   Cervical disc disorder with radiculopathy of cervical region 04/20/2015   Right arm numbness 04/20/2015   Right arm weakness 04/20/2015     Durwin Reges DPT Sharion Settler, SPT  Durwin Reges, PT 09/19/2021, 3:22 PM  [facility] [address] [place], [address] Phone: [phone]   Fax:  [phone]  Name: Ashlee Richardson MRN: [id number] Date of Birth: 11/10/1956

## 2021-09-20 ENCOUNTER — Ambulatory Visit: Payer: Medicare Other | Admitting: Physical Therapy

## 2021-09-20 NOTE — Progress Notes (Signed)
Name: Ashlee Richardson   MRN: [id number]    DOB: 1956-09-27   Date:09/21/2021       Progress Note  Subjective  Chief Complaint  Follow Up  HPI  MS: under the care of Dr. Manuella Ghazi , had a positive IG for herpes on spinal tap but was given reassurance by Dr. Ola Spurr. She denies weakness, but has intermittent tingling an numbness on both arms. Stable and not on medication. MRI showed white plaques. She has some tremors and sometimes decrease in memory, on duloxetine , she was given Aricept for her memory but she stopped taking it. Explained importance of compliance. She states she does not like taking all this medications.    Chronic back pain and neck pain, also has FMS: used to see Dr. Primus Bravo. She went to [facility] with severe back pain and feeling bloated early March, since than she was seen by Dr. Lorin Mercy and per his note the two-level fusion looks good but has progressive degenerative disease above it, and she will start PT and may need more surgery. She continues to have daily pain    GERD and gastritis: seen by Dr. Marius Ditch, taking Dexilant and symptoms are controlled . She was diagnosed with hiatal hernia, she also has constipation, she tried Linzess but caused diarrhea, she was advised to take Miralax instead, but she has only been sporadically and only having bowel movements about once a week.    Insomnia: she states Trazodone helps her sleep, no side effects, stable.    RLS: taking Requip from neurologist, she states she continues to have cramps, she is taking Lyrica daily now but continues to have pain.   DM: A1C was 6.6 % back in September 2021, stayed at 6.6 % but today is down to 6 % with life style modifications.  She denies polyphagia, polydipsia but she has urinary frequency.She is on statin therapy.    Angina Pectoris: seen by Dr. Clayborn Bigness, she has been able to tolerate Rosuvastatin and Zetia, last LDL at goal.  She denies any recent episodes of chest pain, she states since started on Advair  her sob with activity has improved She also had a normal NM myocardial perfusion stress test on 09/12/21 .   Atherosclerosis aorta: on statin therapy, tolerating it well    FMS: she has aches and pains all day, she has been taking Duloxetine and Lyrica daily and pain today is 5/10 , doing better   Migraine headaches: she sees neurologist , she  is now on Qulipta 60 mg daily and is doing well, no recent episodes of migraines. Pain when present is described as a band around her head, she has photophobia and phonophobia.   B12/Vitamin D deficiency: advised to only take b12 a few times a week, continue vitamin D daily. Unchanged   Nocturnal wheezing: going on for months, sometimes she has a dry cough, she never smoked, no history of asthma. She states wheezing started after an URI and has not resolved since. We ordered an CXR on her last visit but she did get it done because she improved with Advair.   Patient Active Problem List   Diagnosis Date Noted   Type 2 diabetes mellitus with pressure callus ([place]) 03/20/2021   Diabetes mellitus type 2, diet-controlled ([place]) 03/20/2021   Onychomycosis of multiple toenails with type 2 diabetes mellitus ([place]) 03/20/2021   Chronic idiopathic constipation 03/20/2021   Lumbar foraminal stenosis 03/08/2021   Myalgia due to statin 05/26/2020   History of lumbar fusion  11/22/2019   GERD (gastroesophageal reflux disease) 04/28/2018   Pure hypercholesterolemia 04/28/2018   Diverticulosis 04/28/2018   RLS (restless legs syndrome) 04/28/2018   Angina pectoris ([place]) 04/28/2018   Vitamin D deficiency 12/27/2017   Chronic fatigue, unspecified 12/27/2017   Intractable migraine without aura and without status migrainosus 10/17/2015   DDD (degenerative disc disease), cervical 05/23/2015   Bilateral occipital neuralgia 05/23/2015   DDD (degenerative disc disease), lumbar 05/23/2015   Multiple sclerosis ([place]) 05/23/2015   Cervical disc disorder with radiculopathy of  cervical region 04/20/2015   Right arm numbness 04/20/2015   Right arm weakness 04/20/2015    Past Surgical History:  Procedure Laterality Date   [place]?   Pt usure of date   BREAST BIOPSY Right    COLONOSCOPY  09/2007   COLONOSCOPY WITH PROPOFOL N/A 08/28/2017   Procedure: COLONOSCOPY WITH PROPOFOL;  Surgeon: Christene Lye, MD;  Location: [facility] ENDOSCOPY;  Service: Endoscopy;  Laterality: N/A;   ESOPHAGOGASTRODUODENOSCOPY (EGD) WITH PROPOFOL N/A 11/24/2018   Procedure: ESOPHAGOGASTRODUODENOSCOPY (EGD) WITH PROPOFOL;  Surgeon: Lin Landsman, MD;  Location: [facility] ENDOSCOPY;  Service: Gastroenterology;  Laterality: N/A;   Foot sugery Right    3rd toe from the right"    Family History  Problem Relation Age of Onset   Heart disease Mother    Diabetes Mother    Kidney disease Mother    Hypertension Mother    Heart disease Father    Hyperlipidemia Father    Hypertension Father    Heart disease Other    Cancer Other        Breast   Diabetes Other    Hyperlipidemia Sister    Hypertension Sister    Kidney disease Sister    Diabetes Sister    Lung cancer Paternal Aunt    Cancer Maternal Grandmother        breast    Social History   Tobacco Use   Smoking status: Never   Smokeless tobacco: Never   Tobacco comments:    smoking cessation materials not required  Substance Use Topics   Alcohol use: No    Alcohol/week: 0.0 standard drinks     Current Outpatient Medications:    Calcium-Magnesium-Vitamin D 300-150-400 MG-MG-UNIT TABS, Take by mouth., Disp: , Rfl:    DULoxetine (CYMBALTA) 60 MG capsule, Take 60 mg by mouth daily., Disp: , Rfl:    ezetimibe (ZETIA) 10 MG tablet, TAKE 1 TABLET BY MOUTH EVERY DAY, Disp: 90 tablet, Rfl: 1   magnesium oxide (MAG-OX) 400 MG tablet, Take 400 mg by mouth daily., Disp: , Rfl:    meclizine (ANTIVERT) 25 MG tablet, Take 25 mg by mouth 3 (three) times daily as needed for dizziness., Disp: , Rfl:    meloxicam (MOBIC)  15 MG tablet, TAKE 1 TABLET BY MOUTH EVERY DAY, Disp: 90 tablet, Rfl: 0   mirabegron ER (MYRBETRIQ) 50 MG TB24 tablet, Take 1 tablet (50 mg total) by mouth daily., Disp: 30 tablet, Rfl: 11   omeprazole (PRILOSEC) 20 MG capsule, Take 20 mg by mouth daily., Disp: , Rfl:    QULIPTA 60 MG TABS, Take 1 tablet by mouth daily., Disp: , Rfl:    rosuvastatin (CRESTOR) 20 MG tablet, TAKE 1 TABLET BY MOUTH EVERY DAY, Disp: 90 tablet, Rfl: 1   SUMAtriptan (IMITREX) 100 MG tablet, Take as directed PRN migraines, Disp: , Rfl:    tiZANidine (ZANAFLEX) 2 MG tablet, TAKE 1 TABLET (2 MG TOTAL) BY MOUTH 3 (THREE) TIMES  DAILY., Disp: , Rfl:    vitamin B-12 (CYANOCOBALAMIN) 1000 MCG tablet, Take 1,000 mcg by mouth daily., Disp: , Rfl:    vitamin E 180 MG (400 UNITS) capsule, Take 400 Units by mouth daily., Disp: , Rfl:    cholecalciferol (VITAMIN D3) 25 MCG (1000 UT) tablet, Take 1,000 Units by mouth daily. (Patient not taking: Reported on 09/21/2021), Disp: , Rfl:    fluticasone-salmeterol (ADVAIR) 100-50 MCG/ACT AEPB, Inhale 1 puff into the lungs 2 (two) times daily., Disp: 60 each, Rfl: 5   pregabalin (LYRICA) 50 MG capsule, Take 1-2 capsules (50-100 mg total) by mouth at bedtime., Disp: 60 capsule, Rfl: 2   traZODone (DESYREL) 50 MG tablet, Take 0.5-1 tablets (25-50 mg total) by mouth at bedtime as needed. for sleep, Disp: 90 tablet, Rfl: 0  Allergies  Allergen Reactions   Diclofenac-Misoprostol Other (See Comments)    "Heart racing"   Fenofibric Acid    Lipitor [Atorvastatin]    Nsaids     I personally reviewed active problem list, medication list, allergies, family history, social history, health maintenance with the patient/caregiver today.   ROS  Constitutional: Negative for fever or weight change.  Respiratory: Negative for cough and shortness of breath.   Cardiovascular: Negative for chest pain or palpitations.  Gastrointestinal: Negative for abdominal pain, no bowel changes.  Musculoskeletal:  Negative for gait problem or joint swelling.  Skin: Negative for rash.  Neurological: Negative for dizziness or headache.  No other specific complaints in a complete review of systems (except as listed in HPI above).   Objective  Vitals:   09/21/21 0750  BP: 132/70  Pulse: 74  Resp: 16  Temp: 98.2 F (36.8 C)  SpO2: 96%  Weight: 185 lb (83.9 kg)  Height: 5\' 8"  (1.727 m)    Body mass index is 28.13 kg/m.  Physical Exam  Constitutional: Patient appears well-developed and well-nourished. Overweight.  No distress.  HEENT: head atraumatic, normocephalic, pupils equal and reactive to light,  neck supple Cardiovascular: Normal rate, regular rhythm and normal heart sounds.  No murmur heard. No BLE edema. Pulmonary/Chest: Effort normal and breath sounds normal. No respiratory distress. Abdominal: Soft.  There is no tenderness. Muscular Skeletal: trigger points positive  Psychiatric: Patient has a normal mood and affect. behavior is normal. Judgment and thought content normal.   Recent Results (from the past 2160 hour(s))  HM DIABETES EYE EXAM     Status: None   Collection Time: 08/17/21 12:00 AM  Result Value Ref Range   HM Diabetic Eye Exam No Retinopathy No Retinopathy  POCT HgB A1C     Status: Abnormal   Collection Time: 09/21/21  8:00 AM  Result Value Ref Range   Hemoglobin A1C 6.0 (A) 4.0 - 5.6 %   HbA1c POC (<> result, manual entry)     HbA1c, POC (prediabetic range)     HbA1c, POC (controlled diabetic range)       PHQ2/9: Depression screen [facility] 2/9 09/21/2021 08/15/2021 05/05/2021 03/20/2021 12/14/2020  Decreased Interest 0 0 0 0 0  Down, Depressed, Hopeless 0 0 0 0 0  PHQ - 2 Score 0 0 0 0 0  Altered sleeping - - - - -  Tired, decreased energy - - - - -  Change in appetite - - - - -  Feeling bad or failure about yourself  - - - - -  Trouble concentrating - - - - -  Moving slowly or fidgety/restless - - - - -  Suicidal  thoughts - - - - -  PHQ-9 Score - - - - -   Difficult doing work/chores - - - - -  Some recent data might be hidden    phq 9 is negative   Fall Risk: Fall Risk  09/21/2021 08/15/2021 05/05/2021 03/20/2021 12/14/2020  Falls in the past year? 0 0 0 0 0  Number falls in past yr: 0 0 - 0 0  Injury with Fall? 0 0 - 0 0  Comment - - - - -  Risk Factor Category  - - - - -  Risk for fall due to : No Fall Risks No Fall Risks - - -  Risk for fall due to: Comment - - - - -  Follow up Falls prevention discussed Falls prevention discussed Falls prevention discussed - -      Functional Status Survey: Is the patient deaf or have difficulty hearing?: Yes Does the patient have difficulty seeing, even when wearing glasses/contacts?: No Does the patient have difficulty concentrating, remembering, or making decisions?: Yes Does the patient have difficulty walking or climbing stairs?: No Does the patient have difficulty dressing or bathing?: No Does the patient have difficulty doing errands alone such as visiting a doctor's office or shopping?: No    Assessment & Plan  1. Type 2 diabetes mellitus with pressure callus (HCC)  - POCT HgB A1C - CBC with Differential/Platelet - COMPLETE METABOLIC PANEL WITH GFR - Microalbumin / creatinine urine ratio  2. Need for immunization against influenza  - Flu Vaccine QUAD 63mo+IM (Fluarix, Fluzone & Alfiuria Quad PF)  3. Fibromyalgia  - pregabalin (LYRICA) 50 MG capsule; Take 1-2 capsules (50-100 mg total) by mouth at bedtime.  Dispense: 60 capsule; Refill: 2  4. Psychophysiological insomnia  - traZODone (DESYREL) 50 MG tablet; Take 0.5-1 tablets (25-50 mg total) by mouth at bedtime as needed. for sleep  Dispense: 90 tablet; Refill: 0  5. MS (multiple sclerosis) ([place])   6. Angina pectoris ([place])   7. Dyslipidemia  - Lipid panel  8. Diabetes mellitus type 2, diet-controlled (HCC)  - Microalbumin / creatinine urine ratio  9. Atherosclerosis of aorta (HCC)  - Lipid panel  10.  Migraine aura without headache   11. RLS (restless legs syndrome)   12. B12 deficiency  - Vitamin B12  13. Dyslipidemia associated with type 2 diabetes mellitus ([place])   14. Mild persistent reactive airway disease without complication  - fluticasone-salmeterol (ADVAIR) 100-50 MCG/ACT AEPB; Inhale 1 puff into the lungs 2 (two) times daily.  Dispense: 60 each; Refill: 5  15. Ovarian failure   16. Osteoporosis screening  Bone density, she wants to go to [facility]   17. Vitamin D deficiency  - VITAMIN D 25 Hydroxy (Vit-D Deficiency, Fractures)

## 2021-09-21 ENCOUNTER — Other Ambulatory Visit: Payer: Self-pay

## 2021-09-21 ENCOUNTER — Ambulatory Visit (INDEPENDENT_AMBULATORY_CARE_PROVIDER_SITE_OTHER): Payer: Medicare Other | Admitting: Family Medicine

## 2021-09-21 ENCOUNTER — Encounter: Payer: Self-pay | Admitting: Family Medicine

## 2021-09-21 VITALS — BP 132/70 | HR 74 | Temp 98.2°F | Resp 16 | Ht 68.0 in | Wt 185.0 lb

## 2021-09-21 DIAGNOSIS — M797 Fibromyalgia: Secondary | ICD-10-CM

## 2021-09-21 DIAGNOSIS — G2581 Restless legs syndrome: Secondary | ICD-10-CM | POA: Diagnosis not present

## 2021-09-21 DIAGNOSIS — E538 Deficiency of other specified B group vitamins: Secondary | ICD-10-CM | POA: Diagnosis not present

## 2021-09-21 DIAGNOSIS — G43109 Migraine with aura, not intractable, without status migrainosus: Secondary | ICD-10-CM | POA: Diagnosis not present

## 2021-09-21 DIAGNOSIS — I209 Angina pectoris, unspecified: Secondary | ICD-10-CM

## 2021-09-21 DIAGNOSIS — E785 Hyperlipidemia, unspecified: Secondary | ICD-10-CM | POA: Diagnosis not present

## 2021-09-21 DIAGNOSIS — E1169 Type 2 diabetes mellitus with other specified complication: Secondary | ICD-10-CM

## 2021-09-21 DIAGNOSIS — E119 Type 2 diabetes mellitus without complications: Secondary | ICD-10-CM

## 2021-09-21 DIAGNOSIS — F5104 Psychophysiologic insomnia: Secondary | ICD-10-CM

## 2021-09-21 DIAGNOSIS — L84 Corns and callosities: Secondary | ICD-10-CM

## 2021-09-21 DIAGNOSIS — Z23 Encounter for immunization: Secondary | ICD-10-CM | POA: Diagnosis not present

## 2021-09-21 DIAGNOSIS — I700 Atherosclerosis of aorta: Secondary | ICD-10-CM

## 2021-09-21 DIAGNOSIS — G35 Multiple sclerosis: Secondary | ICD-10-CM

## 2021-09-21 DIAGNOSIS — E559 Vitamin D deficiency, unspecified: Secondary | ICD-10-CM

## 2021-09-21 DIAGNOSIS — E2839 Other primary ovarian failure: Secondary | ICD-10-CM

## 2021-09-21 DIAGNOSIS — E11628 Type 2 diabetes mellitus with other skin complications: Secondary | ICD-10-CM | POA: Diagnosis not present

## 2021-09-21 DIAGNOSIS — J4530 Mild persistent asthma, uncomplicated: Secondary | ICD-10-CM

## 2021-09-21 DIAGNOSIS — Z13820 Encounter for screening for osteoporosis: Secondary | ICD-10-CM

## 2021-09-21 LAB — POCT GLYCOSYLATED HEMOGLOBIN (HGB A1C): Hemoglobin A1C: 6 % — AB (ref 4.0–5.6)

## 2021-09-21 MED ORDER — PREGABALIN 50 MG PO CAPS
50.0000 mg | ORAL_CAPSULE | Freq: Every evening | ORAL | 2 refills | Status: DC
Start: 2021-09-21 — End: 2024-04-27

## 2021-09-21 MED ORDER — TRAZODONE HCL 50 MG PO TABS: 25.0000 mg | ORAL_TABLET | Freq: Every evening | ORAL | 0 refills | Status: DC | PRN

## 2021-09-21 MED ORDER — FLUTICASONE-SALMETEROL 100-50 MCG/ACT IN AEPB: 1.0000 | INHALATION_SPRAY | Freq: Two times a day (BID) | RESPIRATORY_TRACT | 5 refills | Status: DC

## 2021-09-22 LAB — CBC WITH DIFFERENTIAL/PLATELET
Absolute Monocytes: 270 cells/uL (ref 200–950)
Basophils Absolute: 70 cells/uL (ref 0–200)
Basophils Relative: 1.9 %
Eosinophils Absolute: 189 cells/uL (ref 15–500)
Eosinophils Relative: 5.1 %
HCT: 40.6 % (ref 35.0–45.0)
Hemoglobin: 13.1 g/dL (ref 11.7–15.5)
Lymphs Abs: 1650 cells/uL (ref 850–3900)
MCH: 27.1 pg (ref 27.0–33.0)
MCHC: 32.3 g/dL (ref 32.0–36.0)
MCV: 83.9 fL (ref 80.0–100.0)
MPV: 12.1 fL (ref 7.5–12.5)
Monocytes Relative: 7.3 %
Neutro Abs: 1521 cells/uL (ref 1500–7800)
Neutrophils Relative %: 41.1 %
Platelets: 192 10*3/uL (ref 140–400)
RBC: 4.84 10*6/uL (ref 3.80–5.10)
RDW: 12.6 % (ref 11.0–15.0)
Total Lymphocyte: 44.6 %
WBC: 3.7 10*3/uL — ABNORMAL LOW (ref 3.8–10.8)

## 2021-09-22 LAB — COMPLETE METABOLIC PANEL WITH GFR
AG Ratio: 2 (calc) (ref 1.0–2.5)
ALT: 16 U/L (ref 6–29)
AST: 14 U/L (ref 10–35)
Albumin: 4.7 g/dL (ref 3.6–5.1)
Alkaline phosphatase (APISO): 57 U/L (ref 37–153)
BUN: 11 mg/dL (ref 7–25)
CO2: 29 mmol/L (ref 20–32)
Calcium: 9.9 mg/dL (ref 8.6–10.4)
Chloride: 105 mmol/L (ref 98–110)
Creat: 0.65 mg/dL (ref 0.50–1.05)
Globulin: 2.3 g/dL (calc) (ref 1.9–3.7)
Glucose, Bld: 116 mg/dL — ABNORMAL HIGH (ref 65–99)
Potassium: 4.9 mmol/L (ref 3.5–5.3)
Sodium: 142 mmol/L (ref 135–146)
Total Bilirubin: 0.4 mg/dL (ref 0.2–1.2)
Total Protein: 7 g/dL (ref 6.1–8.1)
eGFR: 98 mL/min/{1.73_m2} (ref >=60)

## 2021-09-22 LAB — LIPID PANEL
Cholesterol: 169 mg/dL (ref <200)
HDL: 86 mg/dL (ref >=50)
LDL Cholesterol (Calc): 70 mg/dL (calc)
Non-HDL Cholesterol (Calc): 83 mg/dL (calc) (ref <130)
Total CHOL/HDL Ratio: 2 (calc) (ref <5.0)
Triglycerides: 54 mg/dL (ref <150)

## 2021-09-22 LAB — VITAMIN B12: Vitamin B-12: 494 pg/mL (ref 200–1100)

## 2021-09-22 LAB — MICROALBUMIN / CREATININE URINE RATIO
Creatinine, Urine: 145 mg/dL (ref 20–275)
Microalb Creat Ratio: 6 mcg/mg creat (ref <30)
Microalb, Ur: 0.9 mg/dL

## 2021-09-22 LAB — VITAMIN D 25 HYDROXY (VIT D DEFICIENCY, FRACTURES): Vit D, 25-Hydroxy: 35 ng/mL (ref 30–100)

## 2021-10-09 DIAGNOSIS — R0602 Shortness of breath: Secondary | ICD-10-CM | POA: Diagnosis not present

## 2021-10-09 DIAGNOSIS — R5382 Chronic fatigue, unspecified: Secondary | ICD-10-CM | POA: Diagnosis not present

## 2021-10-09 DIAGNOSIS — J984 Other disorders of lung: Secondary | ICD-10-CM | POA: Diagnosis not present

## 2021-10-09 DIAGNOSIS — E7800 Pure hypercholesterolemia, unspecified: Secondary | ICD-10-CM | POA: Diagnosis not present

## 2021-10-09 DIAGNOSIS — I208 Other forms of angina pectoris: Secondary | ICD-10-CM | POA: Diagnosis not present

## 2021-10-09 DIAGNOSIS — E119 Type 2 diabetes mellitus without complications: Secondary | ICD-10-CM | POA: Diagnosis not present

## 2021-10-09 DIAGNOSIS — G35 Multiple sclerosis: Secondary | ICD-10-CM | POA: Diagnosis not present

## 2021-10-09 DIAGNOSIS — I700 Atherosclerosis of aorta: Secondary | ICD-10-CM | POA: Diagnosis not present

## 2021-10-09 DIAGNOSIS — M25473 Effusion, unspecified ankle: Secondary | ICD-10-CM | POA: Diagnosis not present

## 2021-10-09 DIAGNOSIS — R1084 Generalized abdominal pain: Secondary | ICD-10-CM | POA: Diagnosis not present

## 2021-11-29 ENCOUNTER — Encounter: Payer: Medicare Other | Admitting: Podiatry

## 2021-12-13 ENCOUNTER — Other Ambulatory Visit: Payer: Self-pay | Admitting: Family Medicine

## 2021-12-13 DIAGNOSIS — F5104 Psychophysiologic insomnia: Secondary | ICD-10-CM

## 2021-12-19 DIAGNOSIS — Z1231 Encounter for screening mammogram for malignant neoplasm of breast: Secondary | ICD-10-CM | POA: Diagnosis not present

## 2021-12-19 LAB — HM MAMMOGRAPHY

## 2021-12-20 ENCOUNTER — Encounter: Payer: Self-pay | Admitting: Podiatry

## 2021-12-20 ENCOUNTER — Ambulatory Visit (INDEPENDENT_AMBULATORY_CARE_PROVIDER_SITE_OTHER): Payer: Medicare Other | Admitting: Podiatry

## 2021-12-20 ENCOUNTER — Other Ambulatory Visit: Payer: Self-pay

## 2021-12-20 DIAGNOSIS — L603 Nail dystrophy: Secondary | ICD-10-CM | POA: Diagnosis not present

## 2021-12-20 MED ORDER — TERBINAFINE HCL 250 MG PO TABS: 250.0000 mg | ORAL_TABLET | Freq: Every day | ORAL | 0 refills | Status: DC

## 2021-12-21 NOTE — Progress Notes (Signed)
She presents today for follow-up of her nail fungus.  She has been treating it with Lamisil she has completed her second every other day dose.  She states that they are looking better than they were but they need to be cut.  Objective: Vital signs are stable alert oriented x3 nails are grossly long thick yellowed darkened still over half mycotic I would say.  Assessment: Long-term therapy onychomycosis.  Plan: Ongoing encouraged her to continue an every other day dosing cycle and I will follow-up with her in about 3 to 4 months.

## 2022-02-13 ENCOUNTER — Ambulatory Visit: Payer: Self-pay | Admitting: *Deleted

## 2022-02-13 NOTE — Telephone Encounter (Signed)
° °  Chief Complaint: Vaginal spotting Symptoms: Abdominal pain Frequency: Started last week Pertinent Negatives: Patient denies  Disposition: [] ED /[] Urgent Care (no appt availability in office) / [x] Appointment(In office/virtual)/ []  [place] [facility]/ [] Home Care/ [] Refused Recommended Disposition /[]  Mobile Bus/ []  Follow-up with PCP Additional Notes:   Reason for Disposition  Bleeding lasts for > 7 days  Answer Assessment - Initial Assessment Questions 1. AMOUNT: "Describe the bleeding that you are having." "How much bleeding is there?"    - SPOTTING: spotting, or pinkish / brownish mucous discharge; does not fill panty liner or pad    - MILD:  less than 1 pad / hour; less than patient's usual menstrual bleeding   - MODERATE: 1-2 pads / hour; 1 menstrual cup every 6 hours; small-medium blood clots (e.g., pea, grape, small coin)   - SEVERE: soaking 2 or more pads/hour for 2 or more hours; 1 menstrual cup every 2 hours; bleeding not contained by pads or continuous red blood from vagina; large blood clots (e.g., golf ball, large coin)      Spotting 2. ONSET: "When did the bleeding begin?" "Is it continuing now?"     Last week 3. MENOPAUSE: "When was your last menstrual period?"      Years ago 4. ABDOMINAL PAIN: "Do you have any pain?" "How bad is the pain?"  (e.g., Scale 1-10; mild, moderate, or severe)   - MILD (1-3): doesn't interfere with normal activities, abdomen soft and not tender to touch    - MODERATE (4-7): interferes with normal activities or awakens from sleep, abdomen tender to touch    - SEVERE (8-10): excruciating pain, doubled over, unable to do any normal activities      5 5. BLOOD THINNERS: "Do you take any blood thinners?" (e.g., Coumadin/warfarin, Pradaxa/dabigatran, aspirin)     No 6. HORMONES: "Are you taking any hormone medications, prescription or OTC?" (e.g., birth control pills, estrogen)     No 7. CAUSE: "What do you think is causing the  bleeding?" (e.g., recent gyn surgery, recent gyn procedure; known bleeding disorder, uterine cancer)       Unsure 8. HEMODYNAMIC STATUS: "Are you weak or feeling lightheaded?" If Yes, ask: "Can you stand and walk normally?"       Feels weak 9. OTHER SYMPTOMS: "What other symptoms are you having with the bleeding?" (e.g., back pain, burning with urination, fever)     No  Protocols used: Vaginal Bleeding - Postmenopausal-A-AH

## 2022-02-13 NOTE — Telephone Encounter (Signed)
Summary: Vaginal spotting   Pt called reporting that she has been experiencing vaginal spotting and wants to be seen soon.   Best contact: [phone]       Called patient to review vaginal spotting sx. No answer. LVMTCB [phone].

## 2022-02-13 NOTE — Telephone Encounter (Signed)
2nd attempt to contact patient to review sx. No answer, LVMTCB #[phone]. 

## 2022-02-16 ENCOUNTER — Other Ambulatory Visit (HOSPITAL_COMMUNITY)
Admission: RE | Admit: 2022-02-16 | Discharge: 2022-02-16 | Disposition: A | Discharge status: Home / Self Care | Payer: Medicare Other | Source: Ambulatory Visit | Attending: Nurse Practitioner | Admitting: Nurse Practitioner

## 2022-02-16 ENCOUNTER — Ambulatory Visit (INDEPENDENT_AMBULATORY_CARE_PROVIDER_SITE_OTHER): Payer: Medicare Other | Admitting: Nurse Practitioner

## 2022-02-16 ENCOUNTER — Encounter: Payer: Self-pay | Admitting: Nurse Practitioner

## 2022-02-16 VITALS — BP 140/70 | HR 89 | Temp 98.1°F | Resp 16 | Ht 68.0 in | Wt 182.3 lb

## 2022-02-16 DIAGNOSIS — R195 Other fecal abnormalities: Secondary | ICD-10-CM

## 2022-02-16 DIAGNOSIS — Z01411 Encounter for gynecological examination (general) (routine) with abnormal findings: Secondary | ICD-10-CM | POA: Diagnosis not present

## 2022-02-16 DIAGNOSIS — N939 Abnormal uterine and vaginal bleeding, unspecified: Secondary | ICD-10-CM | POA: Insufficient documentation

## 2022-02-16 LAB — COMPLETE METABOLIC PANEL WITH GFR
AG Ratio: 2.1 (calc) (ref 1.0–2.5)
ALT: 18 U/L (ref 6–29)
AST: 18 U/L (ref 10–35)
Albumin: 4.7 g/dL (ref 3.6–5.1)
Alkaline phosphatase (APISO): 61 U/L (ref 37–153)
BUN: 10 mg/dL (ref 7–25)
CO2: 30 mmol/L (ref 20–32)
Calcium: 10.1 mg/dL (ref 8.6–10.4)
Chloride: 104 mmol/L (ref 98–110)
Creat: 0.81 mg/dL (ref 0.50–1.05)
Globulin: 2.2 g/dL (calc) (ref 1.9–3.7)
Glucose, Bld: 85 mg/dL (ref 65–99)
Potassium: 4.9 mmol/L (ref 3.5–5.3)
Sodium: 140 mmol/L (ref 135–146)
Total Bilirubin: 0.5 mg/dL (ref 0.2–1.2)
Total Protein: 6.9 g/dL (ref 6.1–8.1)
eGFR: 81 mL/min/{1.73_m2} (ref >=60)

## 2022-02-16 LAB — CBC WITH DIFFERENTIAL/PLATELET
Absolute Monocytes: 447 cells/uL (ref 200–950)
Basophils Absolute: 70 cells/uL (ref 0–200)
Basophils Relative: 1.2 %
Eosinophils Absolute: 157 cells/uL (ref 15–500)
Eosinophils Relative: 2.7 %
HCT: 40 % (ref 35.0–45.0)
Hemoglobin: 13 g/dL (ref 11.7–15.5)
Lymphs Abs: 2071 cells/uL (ref 850–3900)
MCH: 27.3 pg (ref 27.0–33.0)
MCHC: 32.5 g/dL (ref 32.0–36.0)
MCV: 83.9 fL (ref 80.0–100.0)
MPV: 12.3 fL (ref 7.5–12.5)
Monocytes Relative: 7.7 %
Neutro Abs: 3057 cells/uL (ref 1500–7800)
Neutrophils Relative %: 52.7 %
Platelets: 210 10*3/uL (ref 140–400)
RBC: 4.77 10*6/uL (ref 3.80–5.10)
RDW: 12.2 % (ref 11.0–15.0)
Total Lymphocyte: 35.7 %
WBC: 5.8 10*3/uL (ref 3.8–10.8)

## 2022-02-16 NOTE — Progress Notes (Signed)
BP 140/70    Pulse 89    Temp 98.1 F (36.7 C) (Oral)    Resp 16    Ht _0  (1.727 m)    Wt 182 lb 4.8 oz (82.7 kg)    SpO2 98%    BMI 27.72 kg/m    Subjective:    Patient ID: Ashlee Richardson, female    DOB: 1956-12-02, 66 y.o.   MRN: [id number]  HPI: Ashlee Richardson is a 66 y.o. female  Chief Complaint  Patient presents with   Vaginal Bleeding    On/off 2 weeks, lower pelvic pain-3. Pt is concerned would like pap smear done and would like to get checked since she states cancer runs in her family.   Vaginal bleeding: She says that over the last two weeks she has had some vaginal spotting and pelvic pain.  She denies any urinary symptoms. She says the pain is a cramping feeling. She says she is post menopausal for many years.  Will do pap, labs, pelvic ultrasound and refer to GYN.   Change in stools: She says she has been having dark stools for several weeks.  She says that she has had bouts of constipation but is not currently constipated. Had a colonoscopy in 2018. Requesting referral to GI. Referral placed.   Relevant past medical, surgical, family and social history reviewed and updated as indicated. Interim medical history since our last visit reviewed. Allergies and medications reviewed and updated.  Review of Systems  Constitutional: Negative for fever or weight change.  Respiratory: Negative for cough and shortness of breath.   Cardiovascular: Negative for chest pain or palpitations.  Gastrointestinal: Negative for abdominal pain, dark stools GU: positive for vaginal bleeding  Musculoskeletal: Negative for gait problem or joint swelling.  Skin: Negative for rash.  Neurological: Negative for dizziness or headache.  No other specific complaints in a complete review of systems (except as listed in HPI above).      Objective:    BP 140/70    Pulse 89    Temp 98.1 F (36.7 C) (Oral)    Resp 16    Ht _1  (1.727 m)    Wt 182 lb 4.8 oz (82.7 kg)    SpO2 98%    BMI  27.72 kg/m   Wt Readings from Last 3 Encounters:  02/16/22 182 lb 4.8 oz (82.7 kg)  09/21/21 185 lb (83.9 kg)  08/15/21 181 lb 6.4 oz (82.3 kg)    Physical Exam  Constitutional: Patient appears well-developed and well-nourished. Overweigth  No distress.  HEENT: head atraumatic, normocephalic, pupils equal and reactive to light, neck supple Cardiovascular: Normal rate, regular rhythm and normal heart sounds.  No murmur heard. No BLE edema. Pulmonary/Chest: Effort normal and breath sounds normal. No respiratory distress. Abdominal: Soft.  There is tenderness. Pelvic exam: normal external genitalia, vulva, vagina, cervix, uterus and adnexa.  Psychiatric: Patient has a normal mood and affect. behavior is normal. Judgment and thought content normal.   Results for orders placed or performed in visit on 09/21/21  Lipid panel  Result Value Ref Range   Cholesterol 169 <200 mg/dL   HDL 86 > OR = 50 mg/dL   Triglycerides 54 <150 mg/dL   LDL Cholesterol (Calc) 70 mg/dL (calc)   Total CHOL/HDL Ratio 2.0 <5.0 (calc)   Non-HDL Cholesterol (Calc) 83 <130 mg/dL (calc)  CBC with Differential/Platelet  Result Value Ref Range   WBC 3.7 (L) 3.8 - 10.8 Thousand/uL   RBC  4.84 3.80 - 5.10 Million/uL   Hemoglobin 13.1 11.7 - 15.5 g/dL   HCT 40.6 35.0 - 45.0 %   MCV 83.9 80.0 - 100.0 fL   MCH 27.1 27.0 - 33.0 pg   MCHC 32.3 32.0 - 36.0 g/dL   RDW 12.6 11.0 - 15.0 %   Platelets 192 140 - 400 Thousand/uL   MPV 12.1 7.5 - 12.5 fL   Neutro Abs 1,521 1,500 - 7,800 cells/uL   Lymphs Abs 1,650 850 - 3,900 cells/uL   Absolute Monocytes 270 200 - 950 cells/uL   Eosinophils Absolute 189 15 - 500 cells/uL   Basophils Absolute 70 0 - 200 cells/uL   Neutrophils Relative % 41.1 %   Total Lymphocyte 44.6 %   Monocytes Relative 7.3 %   Eosinophils Relative 5.1 %   Basophils Relative 1.9 %  COMPLETE METABOLIC PANEL WITH GFR  Result Value Ref Range   Glucose, Bld 116 (H) 65 - 99 mg/dL   BUN 11 7 - 25 mg/dL    Creat 0.65 0.50 - 1.05 mg/dL   eGFR 98 > OR = 60 mL/min/1.109m   BUN/Creatinine Ratio NOT APPLICABLE 6 - 22 (calc)   Sodium 142 135 - 146 mmol/L   Potassium 4.9 3.5 - 5.3 mmol/L   Chloride 105 98 - 110 mmol/L   CO2 29 20 - 32 mmol/L   Calcium 9.9 8.6 - 10.4 mg/dL   Total Protein 7.0 6.1 - 8.1 g/dL   Albumin 4.7 3.6 - 5.1 g/dL   Globulin 2.3 1.9 - 3.7 g/dL (calc)   AG Ratio 2.0 1.0 - 2.5 (calc)   Total Bilirubin 0.4 0.2 - 1.2 mg/dL   Alkaline phosphatase (APISO) 57 37 - 153 U/L   AST 14 10 - 35 U/L   ALT 16 6 - 29 U/L  Microalbumin / creatinine urine ratio  Result Value Ref Range   Creatinine, Urine 145 20 - 275 mg/dL   Microalb, Ur 0.9 mg/dL   Microalb Creat Ratio 6 <30 mcg/mg creat  VITAMIN D 25 Hydroxy (Vit-D Deficiency, Fractures)  Result Value Ref Range   Vit D, 25-Hydroxy 35 30 - 100 ng/mL  Vitamin B12  Result Value Ref Range   Vitamin B-12 494 200 - 1,100 pg/mL  POCT HgB A1C  Result Value Ref Range   Hemoglobin A1C 6.0 (A) 4.0 - 5.6 %   HbA1c POC (<> result, manual entry)     HbA1c, POC (prediabetic range)     HbA1c, POC (controlled diabetic range)        Assessment & Plan:   1. Vaginal bleeding  - Ambulatory referral to Gynecology - CBC with Differential/Platelet - COMPLETE METABOLIC PANEL WITH GFR - Cytology - PAP - UKoreaPELVIC COMPLETE WITH TRANSVAGINAL; Future  2. Change in stool  - Ambulatory referral to Gastroenterology   Follow up plan: Return if symptoms worsen or fail to improve.

## 2022-02-21 ENCOUNTER — Encounter: Payer: Self-pay | Admitting: Podiatry

## 2022-02-21 ENCOUNTER — Ambulatory Visit (INDEPENDENT_AMBULATORY_CARE_PROVIDER_SITE_OTHER): Payer: Medicare Other | Admitting: Podiatry

## 2022-02-21 ENCOUNTER — Other Ambulatory Visit: Payer: Self-pay

## 2022-02-21 DIAGNOSIS — B351 Tinea unguium: Secondary | ICD-10-CM | POA: Diagnosis not present

## 2022-02-21 DIAGNOSIS — M79676 Pain in unspecified toe(s): Secondary | ICD-10-CM | POA: Diagnosis not present

## 2022-02-21 LAB — CYTOLOGY - PAP
Chlamydia: NEGATIVE
Comment: NEGATIVE
Comment: NORMAL
Diagnosis: NEGATIVE
Neisseria Gonorrhea: NEGATIVE

## 2022-02-21 NOTE — Progress Notes (Signed)
She presents today states that she is having burning along the tibial border of the hallux right.  She smokes me to check her toenail and see if we can trim the edge of it out. ? ?Objective: Vital signs are stable she is alert and oriented x3 sharp incurvated nail margin with tenderness on palpation tibial border hallux right.  No purulence no malodor no signs of infection. ? ?Assessment: Ingrown nail tibial border hallux right. ? ?Plan: Sprayed on ethyl chloride on the end of the toe today and debrided the nail for her along the margin.  We did discuss at some point performing a chemical matrixectomy I will follow-up with her on an as-needed basis. ?

## 2022-02-23 ENCOUNTER — Other Ambulatory Visit: Payer: Self-pay | Admitting: Family Medicine

## 2022-02-23 DIAGNOSIS — E785 Hyperlipidemia, unspecified: Secondary | ICD-10-CM

## 2022-02-27 ENCOUNTER — Ambulatory Visit
Admission: RE | Admit: 2022-02-27 | Discharge: 2022-02-27 | Disposition: A | Discharge status: Home / Self Care | Payer: Medicare Other | Source: Ambulatory Visit | Attending: Nurse Practitioner | Admitting: Nurse Practitioner

## 2022-02-27 ENCOUNTER — Other Ambulatory Visit: Payer: Self-pay

## 2022-02-27 DIAGNOSIS — N939 Abnormal uterine and vaginal bleeding, unspecified: Secondary | ICD-10-CM | POA: Insufficient documentation

## 2022-03-02 ENCOUNTER — Telehealth: Payer: Self-pay | Admitting: Gastroenterology

## 2022-03-02 NOTE — Telephone Encounter (Signed)
Hi Dr. Fuller Plan, ? ?We received a referral for patient to be seen for change in stool. Is a current patient over at [facility]. Patient states she's not happy with that office seeking to transfer her care to you. Records are available in Epic for you to review and advise on scheduling. ? ?Thank you. ?

## 2022-03-05 NOTE — Telephone Encounter (Signed)
Request received to transfer GI care from outside practice to [facility].  We appreciate the interest in our practice, however at this time due to high demand from patients without established GI providers we cannot accommodate this transfer.  Ability to accommodate future transfer requests may change over time and the patient can contact us again in 6-12 months if still interested in being seen at [facility].      °

## 2022-03-08 ENCOUNTER — Other Ambulatory Visit: Payer: Self-pay | Admitting: Family Medicine

## 2022-03-13 ENCOUNTER — Encounter: Payer: Medicare Other | Admitting: Obstetrics and Gynecology

## 2022-03-19 NOTE — Progress Notes (Signed)
Name: Ashlee Richardson   MRN: [id number]    DOB: Dec 29, 1955   Date:03/20/2022 ? ?     Progress Note ? ?Subjective ? ?Chief Complaint ? ?Follow Up ? ?HPI ? ?MS: under the care of Dr. Manuella Ghazi , had a positive IG for herpes on spinal tap but was given reassurance by Dr. Ola Spurr. She denies weakness, but has intermittent tingling an numbness on both arms. Stable and not on medication. MRI showed white plaques. She has some tremors and sometimes decrease in memory, she is still taking Duloxetine but only prn , she was given Aricept for her memory but she stopped taking it. She states does not want to take medications daily  ? ?Left nipple discharge: she noticed over the past few months bloody nipple discharge, she is not absolutely sure of how long ago , she states it has been staining her bra. No Denies trauma or tenderness, no masses. She had a normal mammogram Dec 2023 but explained we will need to order a diagnostic mammogram at this time  ?  ?Chronic back pain and neck pain, also has FMS: used to see Dr. Primus Bravo. She went to [facility] with severe back pain and feeling bloated early March, since than she was seen by Dr. Lorin Mercy and per his note the two-level fusion looks good but has progressive degenerative disease above it, she had PT and it helped initially but she is back to baseline and thinks she will need to go back for another back surgery.  She continues to have daily pain , she states everything hurts  ?  ?GERD and gastritis: seen by Dr. Marius Ditch, taking Dexilant and symptoms are controlled . She was diagnosed with hiatal hernia, she also has a history of constipation, she tried Linzess but caused diarrhea, she was advised to take Miralax instead. She states last week she had black stools but it was after she took Peptobismol. She states her bowels goes back and forth.  ?  ?Insomnia: she states Trazodone helps her sleep, no side effects, controlled  ?  ?RLS: taking Requip from neurologist, she states she continues to have  cramps, she is taking Lyrica but again only prn.  ? ?DM: A1C was 6.6 % back in September 2021, stayed at 6.6 % but today is down to 6 % with life style modifications.  She denies polyphagia, polydipsia but she has urinary frequency. She stopped crestor but is taking zetia. She is avoid sweets, sodas and cutting down on bread .  ?  ?Angina Pectoris: seen by Dr. Clayborn Bigness, she stopped taking Crestor due to cramps - advised to try half dose and continue  Zetia, last LDL at goal.  She still has chest pain a couple of times a week - episodes lasts a few seconds only,  she states since started on Advair her sob with activity has improved She also had a normal NM myocardial perfusion stress test on 09/12/21 .  ? ?Atherosclerosis aorta: on statin therapy, tolerating it well , last LDL at goal at 70  ?  ?FMS: she has aches and pains all day, she has been taking Duloxetine and Lyrica but only prn , her pain is all over 10/10. I wish I could be admitted to be on pain medications.  ? ?Migraine headaches: she sees neurologist , she  is now on Qulipta 60 mg daily and is doing well, she states no recent migraine episodes but for the past weeks ( after an URI) she has a daily dull  headache. Pain from migraine is described as a band around her head, she has photophobia and phonophobia.  ? ?B12/Vitamin D deficiency: advised to only take b12 a few times a week, continue vitamin D daily.  ? ?Nocturnal wheezing: going on for months, sometimes she has a dry cough, she never smoked, no history of asthma. She states wheezing started after an URI and has not resolved since. She is doing well on Advair and needs a refill.  ? ?Patient Active Problem List  ? Diagnosis Date Noted  ? Type 2 diabetes mellitus with pressure callus ([place]) 03/20/2021  ? Diabetes mellitus type 2, diet-controlled ([place]) 03/20/2021  ? Onychomycosis of multiple toenails with type 2 diabetes mellitus ([place]) 03/20/2021  ? Chronic idiopathic constipation 03/20/2021  ? Lumbar  foraminal stenosis 03/08/2021  ? Myalgia due to statin 05/26/2020  ? History of lumbar fusion 11/22/2019  ? GERD (gastroesophageal reflux disease) 04/28/2018  ? Pure hypercholesterolemia 04/28/2018  ? Diverticulosis 04/28/2018  ? RLS (restless legs syndrome) 04/28/2018  ? Angina pectoris ([place]) 04/28/2018  ? Vitamin D deficiency 12/27/2017  ? Chronic fatigue, unspecified 12/27/2017  ? Intractable migraine without aura and without status migrainosus 10/17/2015  ? DDD (degenerative disc disease), cervical 05/23/2015  ? Bilateral occipital neuralgia 05/23/2015  ? DDD (degenerative disc disease), lumbar 05/23/2015  ? Multiple sclerosis ([place]) 05/23/2015  ? Cervical disc disorder with radiculopathy of cervical region 04/20/2015  ? Right arm numbness 04/20/2015  ? Right arm weakness 04/20/2015  ? ? ?Past Surgical History:  ?Procedure Laterality Date  ? BACK SURGERY  1991?  ? Pt usure of date  ? BREAST BIOPSY Right   ? COLONOSCOPY  09/2007  ? COLONOSCOPY WITH PROPOFOL N/A 08/28/2017  ? Procedure: COLONOSCOPY WITH PROPOFOL;  Surgeon: Christene Lye, MD;  Location: [facility] ENDOSCOPY;  Service: Endoscopy;  Laterality: N/A;  ? ESOPHAGOGASTRODUODENOSCOPY (EGD) WITH PROPOFOL N/A 11/24/2018  ? Procedure: ESOPHAGOGASTRODUODENOSCOPY (EGD) WITH PROPOFOL;  Surgeon: Lin Landsman, MD;  Location: [facility] ENDOSCOPY;  Service: Gastroenterology;  Laterality: N/A;  ? Foot sugery Right   ? 3rd toe from the right"  ? ? ?Family History  ?Problem Relation Age of Onset  ? Heart disease Mother   ? Diabetes Mother   ? Kidney disease Mother   ? Hypertension Mother   ? Heart disease Father   ? Hyperlipidemia Father   ? Hypertension Father   ? Heart disease Other   ? Cancer Other   ?     Breast  ? Diabetes Other   ? Hyperlipidemia Sister   ? Hypertension Sister   ? Kidney disease Sister   ? Diabetes Sister   ? Lung cancer Paternal Aunt   ? Cancer Maternal Grandmother   ?     breast  ? ? ?Social History  ? ?Tobacco Use  ? Smoking status: Never  ?  Smokeless tobacco: Never  ? Tobacco comments:  ?  smoking cessation materials not required  ?Substance Use Topics  ? Alcohol use: No  ?  Alcohol/week: 0.0 standard drinks  ? ? ? ?Current Outpatient Medications:  ?  Calcium-Magnesium-Vitamin D 300-150-400 MG-MG-UNIT TABS, Take by mouth., Disp: , Rfl:  ?  cholecalciferol (VITAMIN D3) 25 MCG (1000 UT) tablet, Take 1,000 Units by mouth daily., Disp: , Rfl:  ?  DULoxetine (CYMBALTA) 60 MG capsule, Take 60 mg by mouth daily., Disp: , Rfl:  ?  ezetimibe (ZETIA) 10 MG tablet, TAKE 1 TABLET BY MOUTH EVERY DAY, Disp: 90 tablet, Rfl: 1 ?  fluticasone-salmeterol (ADVAIR) 100-50 MCG/ACT AEPB, Inhale 1 puff into the lungs 2 (two) times daily., Disp: 60 each, Rfl: 5 ?  magnesium oxide (MAG-OX) 400 MG tablet, Take 400 mg by mouth daily., Disp: , Rfl:  ?  meclizine (ANTIVERT) 25 MG tablet, Take 25 mg by mouth 3 (three) times daily as needed for dizziness., Disp: , Rfl:  ?  meloxicam (MOBIC) 15 MG tablet, TAKE 1 TABLET BY MOUTH EVERY DAY, Disp: 90 tablet, Rfl: 0 ?  mirabegron ER (MYRBETRIQ) 50 MG TB24 tablet, Take 1 tablet (50 mg total) by mouth daily., Disp: 30 tablet, Rfl: 11 ?  omeprazole (PRILOSEC) 20 MG capsule, Take 20 mg by mouth daily., Disp: , Rfl:  ?  pregabalin (LYRICA) 50 MG capsule, Take 1-2 capsules (50-100 mg total) by mouth at bedtime., Disp: 60 capsule, Rfl: 2 ?  QULIPTA 60 MG TABS, Take 1 tablet by mouth daily., Disp: , Rfl:  ?  rosuvastatin (CRESTOR) 20 MG tablet, TAKE 1 TABLET BY MOUTH EVERY DAY, Disp: 90 tablet, Rfl: 0 ?  SUMAtriptan (IMITREX) 100 MG tablet, Take as directed PRN migraines, Disp: , Rfl:  ?  tiZANidine (ZANAFLEX) 2 MG tablet, TAKE 1 TABLET (2 MG TOTAL) BY MOUTH 3 (THREE) TIMES DAILY., Disp: , Rfl:  ?  traZODone (DESYREL) 50 MG tablet, TAKE 0.5-1 TABLETS (25-50 MG TOTAL) BY MOUTH AT BEDTIME AS NEEDED. FOR SLEEP, Disp: 30 tablet, Rfl: 0 ?  vitamin B-12 (CYANOCOBALAMIN) 1000 MCG tablet, Take 1,000 mcg by mouth daily., Disp: , Rfl:  ?  vitamin E 180 MG  (400 UNITS) capsule, Take 400 Units by mouth daily., Disp: , Rfl:  ? ?Allergies  ?Allergen Reactions  ? Diclofenac-Misoprostol Other (See Comments)  ?  "Heart racing"  ? Fenofibric Acid   ? Lipitor [

## 2022-03-20 ENCOUNTER — Other Ambulatory Visit: Payer: Self-pay

## 2022-03-20 ENCOUNTER — Ambulatory Visit (INDEPENDENT_AMBULATORY_CARE_PROVIDER_SITE_OTHER): Payer: Medicare Other | Admitting: Family Medicine

## 2022-03-20 ENCOUNTER — Encounter: Payer: Self-pay | Admitting: Family Medicine

## 2022-03-20 VITALS — BP 112/68 | HR 83 | Resp 16 | Ht 68.0 in | Wt 182.0 lb

## 2022-03-20 DIAGNOSIS — Z13820 Encounter for screening for osteoporosis: Secondary | ICD-10-CM

## 2022-03-20 DIAGNOSIS — J4530 Mild persistent asthma, uncomplicated: Secondary | ICD-10-CM

## 2022-03-20 DIAGNOSIS — L84 Corns and callosities: Secondary | ICD-10-CM

## 2022-03-20 DIAGNOSIS — G35D Multiple sclerosis, unspecified: Secondary | ICD-10-CM

## 2022-03-20 DIAGNOSIS — G2581 Restless legs syndrome: Secondary | ICD-10-CM

## 2022-03-20 DIAGNOSIS — M797 Fibromyalgia: Secondary | ICD-10-CM

## 2022-03-20 DIAGNOSIS — G35 Multiple sclerosis: Secondary | ICD-10-CM | POA: Diagnosis not present

## 2022-03-20 DIAGNOSIS — Z1231 Encounter for screening mammogram for malignant neoplasm of breast: Secondary | ICD-10-CM

## 2022-03-20 DIAGNOSIS — I700 Atherosclerosis of aorta: Secondary | ICD-10-CM

## 2022-03-20 DIAGNOSIS — E2839 Other primary ovarian failure: Secondary | ICD-10-CM

## 2022-03-20 DIAGNOSIS — E11628 Type 2 diabetes mellitus with other skin complications: Secondary | ICD-10-CM | POA: Diagnosis not present

## 2022-03-20 DIAGNOSIS — E559 Vitamin D deficiency, unspecified: Secondary | ICD-10-CM

## 2022-03-20 DIAGNOSIS — E538 Deficiency of other specified B group vitamins: Secondary | ICD-10-CM

## 2022-03-20 DIAGNOSIS — G43109 Migraine with aura, not intractable, without status migrainosus: Secondary | ICD-10-CM

## 2022-03-20 DIAGNOSIS — E785 Hyperlipidemia, unspecified: Secondary | ICD-10-CM

## 2022-03-20 DIAGNOSIS — I209 Angina pectoris, unspecified: Secondary | ICD-10-CM | POA: Diagnosis not present

## 2022-03-20 DIAGNOSIS — N6452 Nipple discharge: Secondary | ICD-10-CM

## 2022-03-20 DIAGNOSIS — R52 Pain, unspecified: Secondary | ICD-10-CM

## 2022-03-21 ENCOUNTER — Other Ambulatory Visit: Payer: Self-pay

## 2022-03-21 ENCOUNTER — Encounter: Payer: Medicare Other | Admitting: Podiatry

## 2022-03-21 DIAGNOSIS — R928 Other abnormal and inconclusive findings on diagnostic imaging of breast: Secondary | ICD-10-CM

## 2022-03-21 DIAGNOSIS — N6452 Nipple discharge: Secondary | ICD-10-CM

## 2022-03-21 DIAGNOSIS — Z13820 Encounter for screening for osteoporosis: Secondary | ICD-10-CM

## 2022-03-21 LAB — HEMOGLOBIN A1C
Hgb A1c MFr Bld: 6.4 % of total Hgb — ABNORMAL HIGH (ref <5.7)
Mean Plasma Glucose: 137 mg/dL
eAG (mmol/L): 7.6 mmol/L

## 2022-03-27 IMAGING — CT CT ABD-PELV W/ CM
2 of 5 series · 16 of 46 positions shown, 18 images · IV contrast (APPLIED)
Comparison: CT of the abdomen and pelvis without contrast on
12/22/2020 and prior study with contrast on 07/25/1999

CLINICAL DATA: Left flank and lower quadrant pain.

EXAM:
CT ABDOMEN AND PELVIS WITH CONTRAST
TECHNIQUE: Multidetector CT imaging of the abdomen and pelvis was performed
using the standard protocol following bolus administration of
intravenous contrast.
CONTRAST:  100mL OMNIPAQUE IOHEXOL 300 MG/ML  SOLN

[Series 2: routine abd/pel with (person_name) · axial · 0.77mm/px · z∈[-928,-553]mm · 13 of 87 slices shown, 15 images]
[im 6/87  soft-tissue]
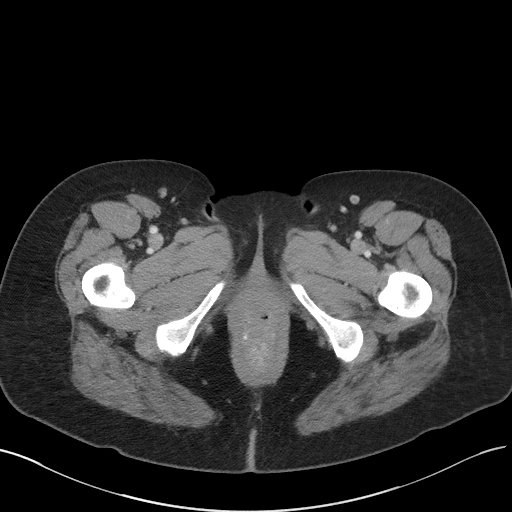
[im 6/87  bone]
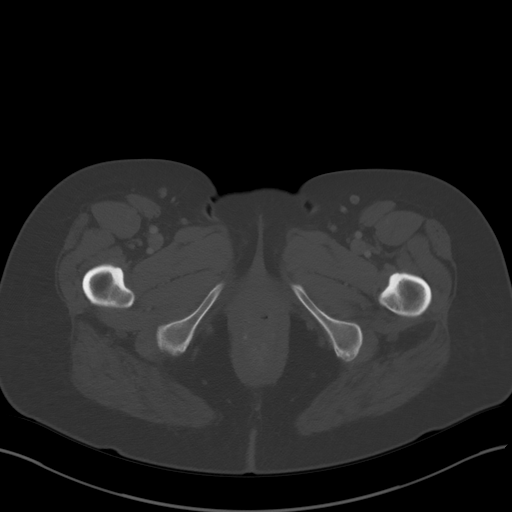
[im 11/87  soft-tissue]
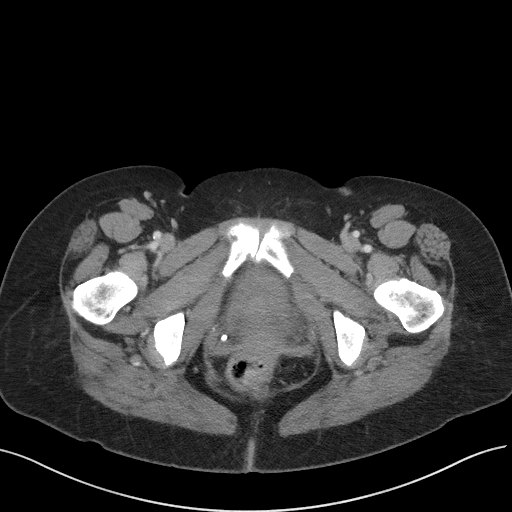
[im 21/87  soft-tissue]
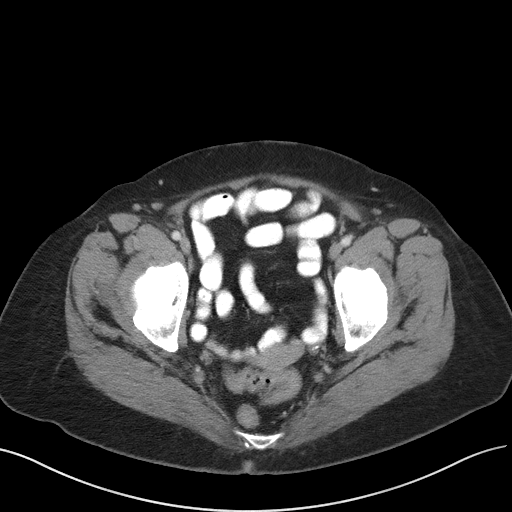
[im 26/87  soft-tissue]
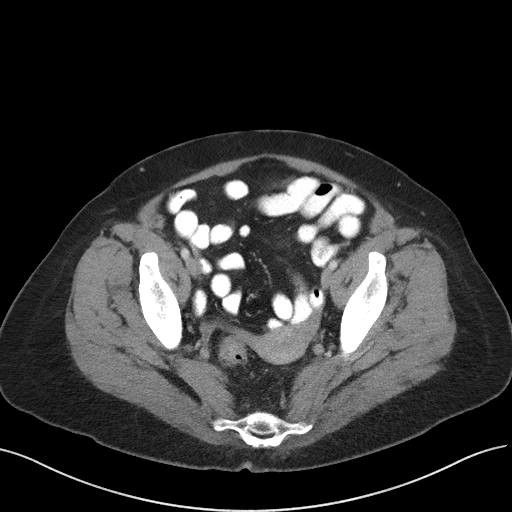
[im 31/87  soft-tissue]
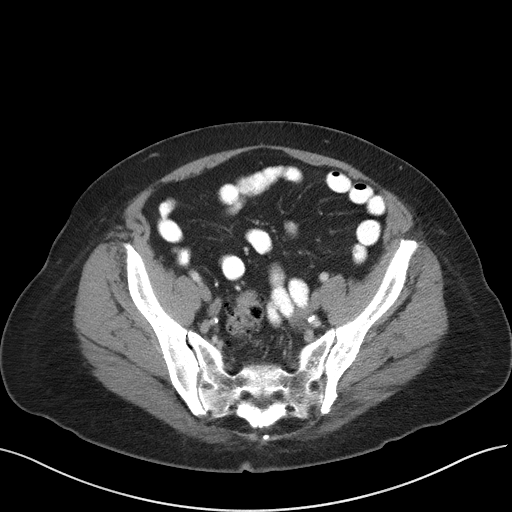
[im 36/87  soft-tissue]
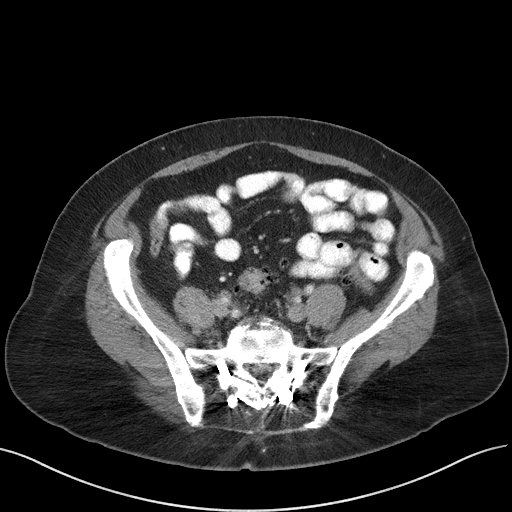
[im 46/87  soft-tissue]
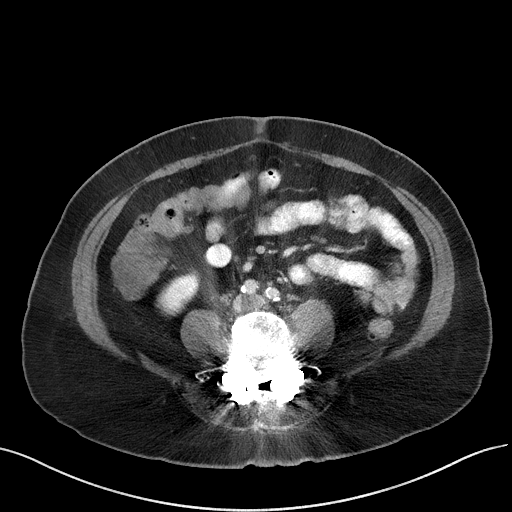
[im 51/87  soft-tissue]
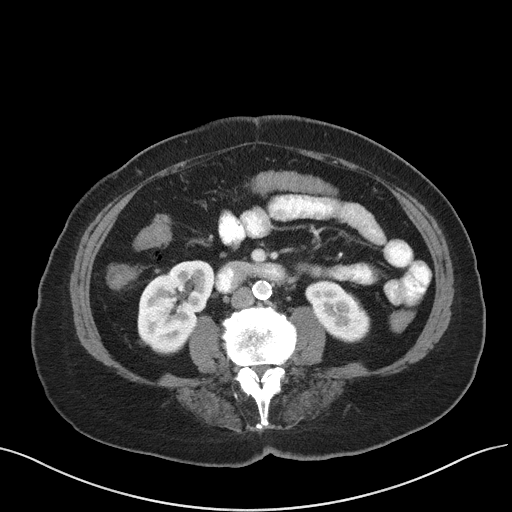
[im 56/87  soft-tissue]
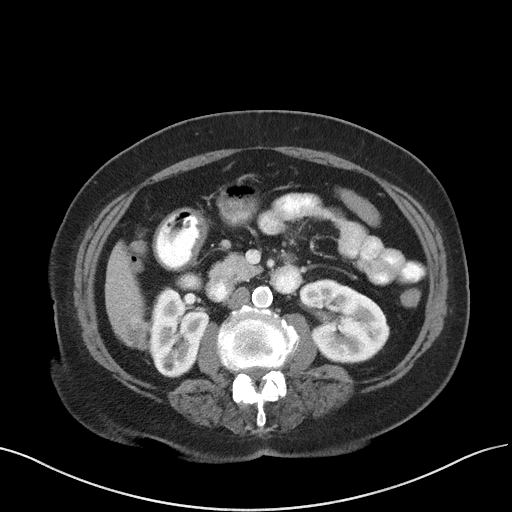
[im 56/87  bone]
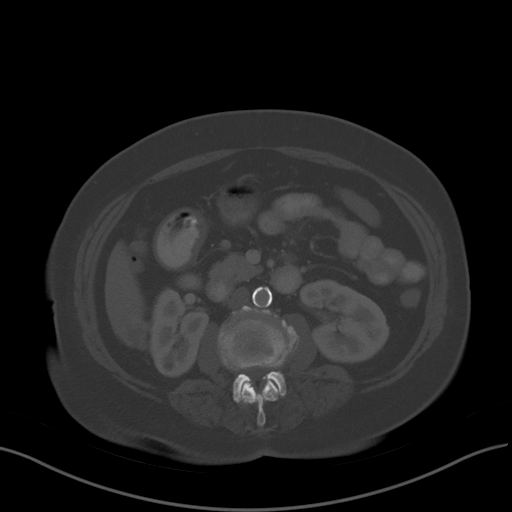
[im 61/87  soft-tissue]
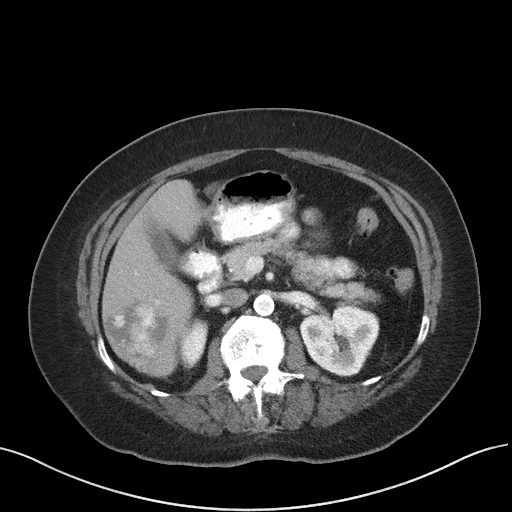
[im 66/87  soft-tissue]
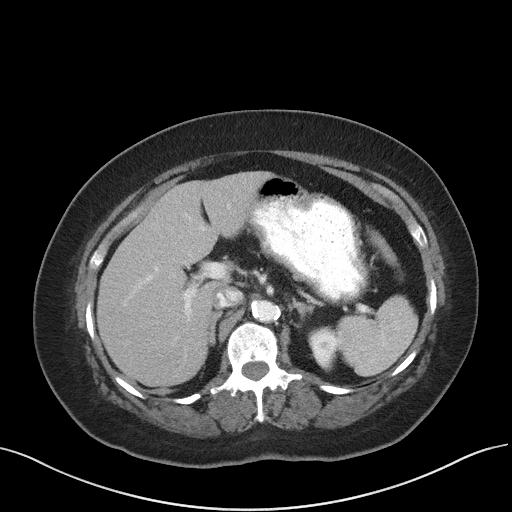
[im 76/87  soft-tissue]
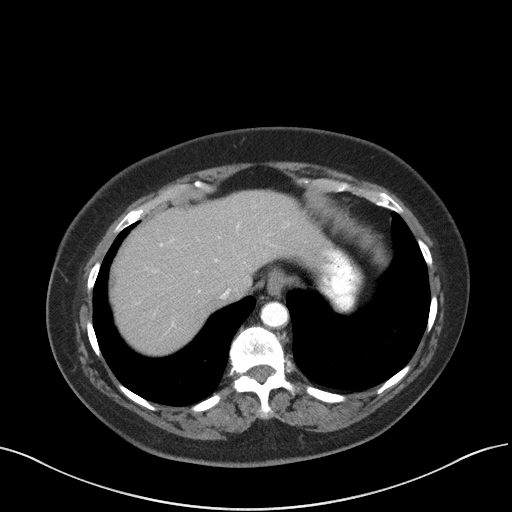
[im 81/87  soft-tissue]
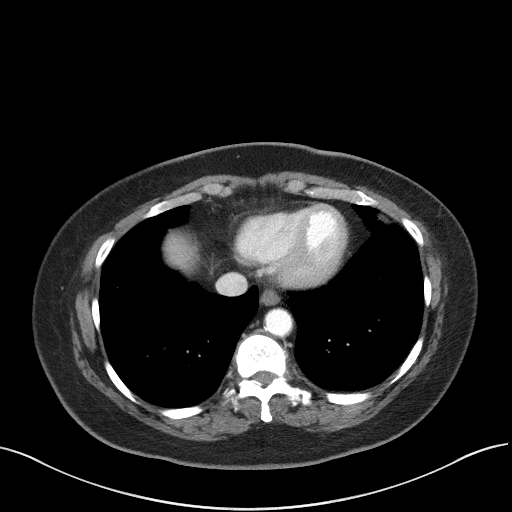

[Series 5: coronal st · coronal · 0.74mm/px · 3 of 96 slices shown]
[im 32/96  soft-tissue]
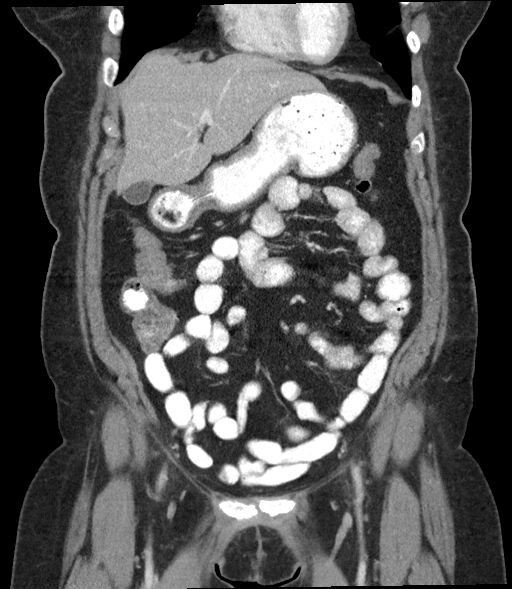
[im 43/96  soft-tissue]
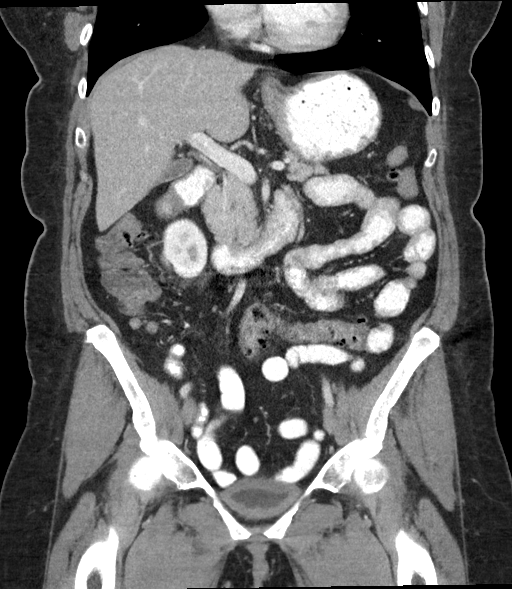
[im 53/96  soft-tissue]
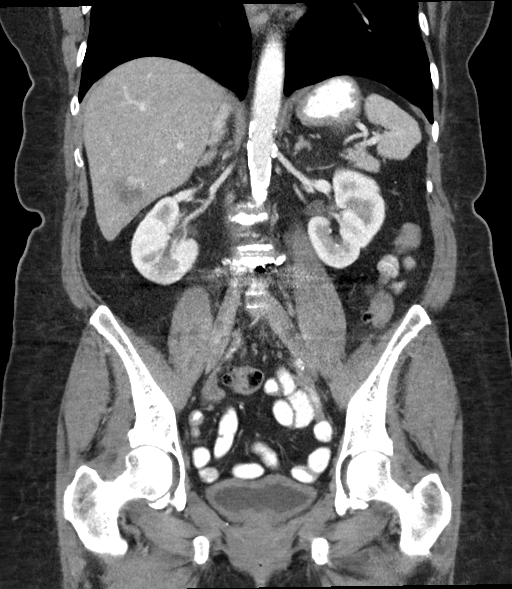

[16 of 46 positions shown; findings below may reference images not displayed]

FINDINGS: Lower chest: No acute abnormality.

Hepatobiliary: Hemangioma again visible in segment VI of the liver
measuring approximately 5.5 cm in greatest diameter. This appears to
have involuted slightly in size since some prior studies where it
measured nearly 7 cm in greatest diameter in 2602 and 6.4 cm in
7032. Stable calcified granuloma in the left lobe of the liver. No
additional liver lesions. The gallbladder appears unremarkable. No
biliary ductal dilatation.

Pancreas: Unremarkable. No pancreatic ductal dilatation or
surrounding inflammatory changes.

Spleen: Normal in size without focal abnormality.

Adrenals/Urinary Tract: Adrenal glands are unremarkable. Kidneys are
normal, without renal calculi, focal lesion, or hydronephrosis.
Bladder is unremarkable.

Stomach/Bowel: Bowel shows no evidence of obstruction, ileus,
inflammatory process or lesion. The appendix is normal.
Diverticulosis of the sigmoid colon present without evidence of
acute diverticulitis by CT. No free intraperitoneal air.

Vascular/Lymphatic: Heavily calcified plaque again noted in the
abdominal aorta without evidence of aneurysm. No enlarged lymph
nodes are identified in the abdomen or pelvis.

Reproductive: Uterus and bilateral adnexa are unremarkable.

Other: No abdominal wall hernia or abnormality. No abdominopelvic
ascites.

Musculoskeletal: Stable appearance of lower lumbar fusion.
IMPRESSION: 1. No acute findings in the abdomen or pelvis.
2. Slight decrease in size of a hemangioma in segment VI of the
liver.
3. Sigmoid diverticulosis without evidence of acute diverticulitis.
4. Aortic atherosclerosis without evidence of aneurysm.

Aortic Atherosclerosis ([id number]).

## 2022-04-11 ENCOUNTER — Other Ambulatory Visit: Payer: Self-pay | Admitting: Family Medicine

## 2022-04-11 DIAGNOSIS — F5104 Psychophysiologic insomnia: Secondary | ICD-10-CM

## 2022-05-16 ENCOUNTER — Encounter: Payer: Self-pay | Admitting: Obstetrics and Gynecology

## 2022-05-16 ENCOUNTER — Other Ambulatory Visit (HOSPITAL_COMMUNITY)
Admission: RE | Admit: 2022-05-16 | Discharge: 2022-05-16 | Disposition: A | Discharge status: Home / Self Care | Payer: Medicare Other | Source: Ambulatory Visit | Attending: Obstetrics and Gynecology | Admitting: Obstetrics and Gynecology

## 2022-05-16 ENCOUNTER — Ambulatory Visit (INDEPENDENT_AMBULATORY_CARE_PROVIDER_SITE_OTHER): Payer: Medicare Other | Admitting: Obstetrics and Gynecology

## 2022-05-16 VITALS — BP 124/66 | HR 69 | Ht 68.0 in | Wt 183.6 lb

## 2022-05-16 DIAGNOSIS — N950 Postmenopausal bleeding: Secondary | ICD-10-CM | POA: Insufficient documentation

## 2022-05-16 DIAGNOSIS — R5383 Other fatigue: Secondary | ICD-10-CM

## 2022-05-16 NOTE — Progress Notes (Signed)
66 yo postmenopausal with BMI 27 referred for the evaluation of postmenopausal vaginal bleeding. Patient reports vaginal bleeding started a few months ago and has been intermittent. She describes changing a pad per day which is minimally stained. She reports some occasional cramping pain. She denies passage of clots. She is not sexually active. She denies any urinary symptoms  Past Medical History:  Diagnosis Date   B12 deficiency 12/27/2017   Diverticulitis    Fibromyalgia    GERD (gastroesophageal reflux disease)    High cholesterol    Migraine    MS (multiple sclerosis) (HCC)    PUD (peptic ulcer disease) 04/28/2018   Past Surgical History:  Procedure Laterality Date   BACK SURGERY  1991?   Pt usure of date   BREAST BIOPSY Right    COLONOSCOPY  09/2007   COLONOSCOPY WITH PROPOFOL N/A 08/28/2017   Procedure: COLONOSCOPY WITH PROPOFOL;  Surgeon: Christene Lye, MD;  Location: [facility] ENDOSCOPY;  Service: Endoscopy;  Laterality: N/A;   ESOPHAGOGASTRODUODENOSCOPY (EGD) WITH PROPOFOL N/A 11/24/2018   Procedure: ESOPHAGOGASTRODUODENOSCOPY (EGD) WITH PROPOFOL;  Surgeon: Lin Landsman, MD;  Location: [facility] ENDOSCOPY;  Service: Gastroenterology;  Laterality: N/A;   Foot sugery Right    3rd toe from the right"   Family History  Problem Relation Age of Onset   Heart disease Mother    Diabetes Mother    Kidney disease Mother    Hypertension Mother    Heart disease Father    Hyperlipidemia Father    Hypertension Father    Heart disease Other    Cancer Other        Breast   Diabetes Other    Hyperlipidemia Sister    Hypertension Sister    Kidney disease Sister    Diabetes Sister    Lung cancer Paternal Aunt    Cancer Maternal Grandmother        breast   Social History   Tobacco Use   Smoking status: Never   Smokeless tobacco: Never   Tobacco comments:    smoking cessation materials not required  Vaping Use   Vaping Use: Never used  Substance Use Topics   Alcohol use:  No    Alcohol/week: 0.0 standard drinks   Drug use: No   ROS See pertinent in HPI. All other systems reviewed and non contributory Blood pressure 124/66, pulse 69, height '5\' 8"'$  (1.727 m), weight 183 lb 9.6 oz (83.3 kg), SpO2 93 %. GENERAL: Well-developed, well-nourished female in no acute distress.  ABDOMEN: Soft, nontender, nondistended. No organomegaly. PELVIC: Normal external female genitalia. Vagina is pale and atrophic.  Normal discharge. Normal appearing cervix. Uterus is normal in size. No adnexal mass or tenderness. Chaperone present during the pelvic exam EXTREMITIES: No cyanosis, clubbing, or edema, 2+ distal pulses.   US PELVIC COMPLETE WITH TRANSVAGINAL  Result Date: 02/27/2022 CLINICAL DATA:  Post menopausal bleeding EXAM: TRANSABDOMINAL AND TRANSVAGINAL ULTRASOUND OF PELVIS TECHNIQUE: Both transabdominal and transvaginal ultrasound examinations of the pelvis were performed. Transabdominal technique was performed for global imaging of the pelvis including uterus, ovaries, adnexal regions, and pelvic cul-de-sac. It was necessary to proceed with endovaginal exam following the transabdominal exam to visualize the uterus endometrium ovaries. COMPARISON:  CT 04/06/2021 FINDINGS: Uterus Measurements: 5.7 x 2.7 x 3.9 cm = volume: 31.2 mL. No fibroids or other mass visualized. Endometrium Thickness: 3.1 mm.  No focal abnormality visualized. Right ovary Not seen Left ovary Not seen Other findings No abnormal free fluid. IMPRESSION: 1. Endometrial thickness of  3.1 mm. In the setting of post-menopausal bleeding, this is consistent with a benign etiology such as endometrial atrophy. If bleeding remains unresponsive to hormonal or medical therapy, sonohysterogram should be considered for focal lesion work-up. (Ref: Radiological Reasoning: Algorithmic Workup of Abnormal Vaginal Bleeding with Endovaginal Sonography and Sonohysterography. AJR 2008; [id number]) 2. Nonvisualized ovaries Electronically  Signed   By: Donavan Foil M.D.   On: 02/27/2022 16:09    A/P 66 yo with postmenopausal vaginal bleeding - Reviewed ultrasound results with the patient - Discussed benefits of endometrial biopsy ENDOMETRIAL BIOPSY     The indications for endometrial biopsy were reviewed.   Risks of the biopsy including cramping, bleeding, infection, uterine perforation, inadequate specimen and need for additional procedures  were discussed. The patient states she understands and agrees to undergo procedure today. Consent was signed. Time out was performed. Urine HCG was negative. A sterile speculum was placed in the patient's vagina and the cervix was prepped with Betadine. A single-toothed tenaculum was placed on the anterior lip of the cervix to stabilize it. The uterine cavity was sounded to a depth of 8 cm using the uterine sound. The 3 mm pipelle was introduced into the endometrial cavity without difficulty, 2 passes were made.  A scant amount of tissue was  sent to pathology. The instruments were removed from the patient's vagina. Minimal bleeding from the cervix was noted. The patient tolerated the procedure well.  Routine post-procedure instructions were given to the patient. The patient will follow up in two weeks to review the results and for further management.  - Patient also endorses some fatigue- TSH ordered. Normal Hg with PCP

## 2022-05-17 LAB — TSH: TSH: 1.99 u[IU]/mL (ref 0.450–4.500)

## 2022-05-18 LAB — SURGICAL PATHOLOGY

## 2022-06-08 LAB — HM MAMMOGRAPHY: HM Mammogram: ABNORMAL — AB (ref 0–4)

## 2022-07-12 ENCOUNTER — Other Ambulatory Visit
Admission: RE | Admit: 2022-07-12 | Discharge: 2022-07-12 | Disposition: A | Discharge status: Home / Self Care | Payer: Medicare Other | Source: Ambulatory Visit | Attending: Student | Admitting: Student

## 2022-07-12 DIAGNOSIS — R5383 Other fatigue: Secondary | ICD-10-CM | POA: Insufficient documentation

## 2022-07-12 DIAGNOSIS — R0789 Other chest pain: Secondary | ICD-10-CM | POA: Insufficient documentation

## 2022-07-12 LAB — BRAIN NATRIURETIC PEPTIDE: B Natriuretic Peptide: 17.1 pg/mL (ref 0.0–100.0)

## 2022-08-21 ENCOUNTER — Ambulatory Visit (INDEPENDENT_AMBULATORY_CARE_PROVIDER_SITE_OTHER): Payer: Medicare Other

## 2022-08-21 VITALS — BP 128/70 | HR 68 | Temp 98.4°F | Resp 16 | Ht 68.0 in | Wt 180.1 lb

## 2022-08-21 DIAGNOSIS — Z0000 Encounter for general adult medical examination without abnormal findings: Secondary | ICD-10-CM | POA: Diagnosis not present

## 2022-08-21 NOTE — Progress Notes (Signed)
Subjective:   Ashlee Richardson is a 66 y.o. female who presents for Medicare Annual (Subsequent) preventive examination.  Review of Systems    Defer to PCP       Objective:    There were no vitals filed for this visit. There is no height or weight on file to calculate BMI.     08/15/2021    2:28 PM 07/24/2021   11:19 AM 03/01/2021    9:07 AM 12/22/2020    7:54 AM 08/11/2020    2:34 PM 05/31/2020    7:13 PM 01/13/2020    5:03 PM  Advanced Directives  Does Patient Have a Medical Advance Directive? No No No No No No No  Would patient like information on creating a medical advance directive? No - Patient declined No - Patient declined   No - Patient declined  No - Patient declined    Current Medications (verified) Outpatient Encounter Medications as of 08/21/2022  Medication Sig   Calcium-Magnesium-Vitamin D 300-150-400 MG-MG-UNIT TABS Take by mouth.   cholecalciferol (VITAMIN D3) 25 MCG (1000 UT) tablet Take 1,000 Units by mouth daily.   DULoxetine (CYMBALTA) 60 MG capsule Take 60 mg by mouth daily.   ezetimibe (ZETIA) 10 MG tablet TAKE 1 TABLET BY MOUTH EVERY DAY   fluticasone-salmeterol (ADVAIR) 100-50 MCG/ACT AEPB Inhale 1 puff into the lungs 2 (two) times daily.   magnesium oxide (MAG-OX) 400 MG tablet Take 400 mg by mouth daily.   meclizine (ANTIVERT) 25 MG tablet Take 25 mg by mouth 3 (three) times daily as needed for dizziness.   meloxicam (MOBIC) 15 MG tablet TAKE 1 TABLET BY MOUTH EVERY DAY   mirabegron ER (MYRBETRIQ) 50 MG TB24 tablet Take 1 tablet (50 mg total) by mouth daily.   omeprazole (PRILOSEC) 20 MG capsule Take 20 mg by mouth daily.   pregabalin (LYRICA) 50 MG capsule Take 1-2 capsules (50-100 mg total) by mouth at bedtime.   QULIPTA 60 MG TABS Take 1 tablet by mouth daily.   rosuvastatin (CRESTOR) 20 MG tablet TAKE 1 TABLET BY MOUTH EVERY DAY   SUMAtriptan (IMITREX) 100 MG tablet Take as directed PRN migraines   tiZANidine (ZANAFLEX) 2 MG tablet TAKE 1 TABLET  (2 MG TOTAL) BY MOUTH 3 (THREE) TIMES DAILY.   traZODone (DESYREL) 50 MG tablet TAKE 0.5-1 TABLETS (25-50 MG TOTAL) BY MOUTH AT BEDTIME AS NEEDED. FOR SLEEP   vitamin B-12 (CYANOCOBALAMIN) 1000 MCG tablet Take 1,000 mcg by mouth daily.   vitamin E 180 MG (400 UNITS) capsule Take 400 Units by mouth daily.   No facility-administered encounter medications on file as of 08/21/2022.    Allergies (verified) Diclofenac-misoprostol, Fenofibric acid, Lipitor [atorvastatin], and Nsaids   History: Past Medical History:  Diagnosis Date   B12 deficiency 12/27/2017   Diverticulitis    Fibromyalgia    GERD (gastroesophageal reflux disease)    High cholesterol    Migraine    MS (multiple sclerosis) (HCC)    PUD (peptic ulcer disease) 04/28/2018   Past Surgical History:  Procedure Laterality Date   BACK SURGERY  1991?   Pt usure of date   BREAST BIOPSY Right    COLONOSCOPY  09/2007   COLONOSCOPY WITH PROPOFOL N/A 08/28/2017   Procedure: COLONOSCOPY WITH PROPOFOL;  Surgeon: Christene Lye, MD;  Location: [facility] ENDOSCOPY;  Service: Endoscopy;  Laterality: N/A;   ESOPHAGOGASTRODUODENOSCOPY (EGD) WITH PROPOFOL N/A 11/24/2018   Procedure: ESOPHAGOGASTRODUODENOSCOPY (EGD) WITH PROPOFOL;  Surgeon: Lin Landsman, MD;  Location:  [facility] ENDOSCOPY;  Service: Gastroenterology;  Laterality: N/A;   Foot sugery Right    3rd toe from the right"   Family History  Problem Relation Age of Onset   Heart disease Mother    Diabetes Mother    Kidney disease Mother    Hypertension Mother    Heart disease Father    Hyperlipidemia Father    Hypertension Father    Heart disease Other    Cancer Other        Breast   Diabetes Other    Hyperlipidemia Sister    Hypertension Sister    Kidney disease Sister    Diabetes Sister    Lung cancer Paternal Aunt    Cancer Maternal Grandmother        breast   Social History   Socioeconomic History   Marital status: Single    Spouse name: Not on file   Number  of children: 2   Years of education: Not on file   Highest education level: Associate degree: academic program  Occupational History    Employer: DISABLED  Tobacco Use   Smoking status: Never   Smokeless tobacco: Never   Tobacco comments:    smoking cessation materials not required  Vaping Use   Vaping Use: Never used  Substance and Sexual Activity   Alcohol use: No    Alcohol/week: 0.0 standard drinks of alcohol   Drug use: No   Sexual activity: Yes    Birth control/protection: Post-menopausal  Other Topics Concern   Not on file  Social History Narrative   Lives at home by herself.   Disable from chronic back pain since 1993   Diagnosed with MS in the 2000's   Social Determinants of Health   Financial Resource Strain: Low Risk  (08/15/2021)   Overall Financial Resource Strain (CARDIA)    Difficulty of Paying Living Expenses: Not very hard  Food Insecurity: No Food Insecurity (08/15/2021)   Hunger Vital Sign    Worried About Running Out of Food in the Last Year: Never true    Ran Out of Food in the Last Year: Never true  Transportation Needs: No Transportation Needs (08/15/2021)   PRAPARE - Hydrologist (Medical): No    Lack of Transportation (Non-Medical): No  Physical Activity: Insufficiently Active (08/15/2021)   Exercise Vital Sign    Days of Exercise per Week: 3 days    Minutes of Exercise per Session: 30 min  Stress: No Stress Concern Present (08/15/2021)   [place]    Feeling of Stress : Not at all  Social Connections: Moderately Integrated (08/15/2021)   Social Connection and Isolation Panel [NHANES]    Frequency of Communication with Friends and Family: More than three times a week    Frequency of Social Gatherings with Friends and Family: More than three times a week    Attends Religious Services: More than 4 times per year    Active Member of [employer] or Organizations:  Yes    Attends Music therapist: More than 4 times per year    Marital Status: Never married    Tobacco Counseling Counseling given: Not Answered Tobacco comments: smoking cessation materials not required   Clinical Intake:                 Diabetic?N/A         Activities of Daily Living    03/20/2022    8:04  AM 02/16/2022   12:58 PM  In your present state of health, do you have any difficulty performing the following activities:  Hearing? 0 0  Vision? 0 0  Difficulty concentrating or making decisions? 0 0  Walking or climbing stairs? 0 0  Dressing or bathing? 0 0  Doing errands, shopping? 0 0    Patient Care Team: Steele Sizer, MD as PCP - General (Family Medicine) Vladimir Crofts, MD as Consulting Physician (Neurology) Yolonda Kida, MD as Consulting Physician (Cardiology) Marybelle Killings, MD as Consulting Physician (Orthopedic Surgery) Garrel Ridgel, DPM as Consulting Physician (Podiatry)  Indicate any recent Medical Services you may have received from other than [facility] providers in the past year (date may be approximate).     Assessment:   This is a routine wellness examination for Ashlee Richardson.  Hearing/Vision screen No results found.  Dietary issues and exercise activities discussed:     Goals Addressed   None   Depression Screen    03/20/2022    8:03 AM 02/16/2022   12:58 PM 09/21/2021    7:50 AM 08/15/2021    2:23 PM 05/05/2021   11:35 AM 03/20/2021    7:36 AM 12/14/2020    2:31 PM  PHQ 2/9 Scores  PHQ - 2 Score 0 0 0 0 0 0 0  PHQ- 9 Score 0 0         Fall Risk    03/20/2022    8:03 AM 02/16/2022   12:58 PM 09/21/2021    7:49 AM 08/15/2021    2:29 PM 05/05/2021   11:34 AM  Fall Risk   Falls in the past year? 0 0 0 0 0  Number falls in past yr: 0 0 0 0   Injury with Fall? 0 0 0 0   Risk for fall due to : No Fall Risks No Fall Risks No Fall Risks No Fall Risks   Follow up Falls prevention discussed Falls prevention discussed  Falls prevention discussed Falls prevention discussed Falls prevention discussed    FALL RISK PREVENTION PERTAINING TO THE HOME:  Any stairs in or around the home? Yes  If so, are there any without handrails? Yes  Home free of loose throw rugs in walkways, pet beds, electrical cords, etc? Yes  Adequate lighting in your home to reduce risk of falls? Yes   ASSISTIVE DEVICES UTILIZED TO PREVENT FALLS:  Life alert? No  Use of a cane, walker or w/c? No  Grab bars in the bathroom? No  Shower chair or bench in shower? No  Elevated toilet seat or a handicapped toilet? No   TIMED UP AND GO:  Was the test performed? Yes .  Length of time to ambulate 10 feet: 3 sec.   Gait steady and fast without use of assistive device  Cognitive Function:        08/15/2021    2:31 PM 08/11/2020    2:36 PM 08/07/2018    8:47 AM  6CIT Screen  What Year? 0 points 0 points 0 points  What month? 0 points 0 points 0 points  What time? 0 points 0 points 0 points  Count back from 20 0 points 0 points 0 points  Months in reverse 0 points 0 points 2 points  Repeat phrase 0 points 2 points 4 points  Total Score 0 points 2 points 6 points    Immunizations Immunization History  Administered Date(s) Administered   Influenza, High Dose Seasonal PF 01/30/2022  Influenza, Quadrivalent, Recombinant, Inj, Pf 01/11/2020   Influenza,inj,Quad PF,6+ Mos 09/20/2020, 09/21/2021   Moderna Covid-19 Vaccine Bivalent Booster 57yr & up 10/09/2021   Moderna Sars-Covid-2 Vaccination 01/29/2020, 02/26/2020, 10/24/2020, 07/27/2021   Td 12/25/2015   Zoster Recombinat (Shingrix) 01/12/2020, 07/02/2020    TDAP status: Up to date  Flu Vaccine status: Due, Education has been provided regarding the importance of this vaccine. Advised may receive this vaccine at local pharmacy or Health Dept. Aware to provide a copy of the vaccination record if obtained from local pharmacy or Health Dept. Verbalized acceptance and  understanding.  Pneumococcal vaccine status: Declined,  Education has been provided regarding the importance of this vaccine but patient still declined. Advised may receive this vaccine at local pharmacy or Health Dept. Aware to provide a copy of the vaccination record if obtained from local pharmacy or Health Dept. Verbalized acceptance and understanding.   Covid-19 vaccine status: Completed vaccines  Qualifies for Shingles Vaccine? No   Zostavax completed No   Shingrix Completed?: Yes  Screening Tests Health Maintenance  Topic Date Due   COVID-19 Vaccine (6 - Moderna risk series) 12/04/2021   Pneumonia Vaccine 66 Years old (1 - PCV) Never done   DEXA SCAN  Never done   FOOT EXAM  03/20/2022   INFLUENZA VACCINE  07/24/2022   OPHTHALMOLOGY EXAM  08/17/2022   URINE MICROALBUMIN  09/21/2022   HEMOGLOBIN A1C  09/20/2022   MAMMOGRAM  12/19/2022   PAP SMEAR-Modifier  02/16/2025   TETANUS/TDAP  12/24/2025   COLONOSCOPY (Pts 45-464yrInsurance coverage will need to be confirmed)  08/29/2027   Hepatitis C Screening  Completed   HIV Screening  Completed   Zoster Vaccines- Shingrix  Completed   HPV VACCINES  Aged Out    Health Maintenance  Health Maintenance Due  Topic Date Due   COVID-19 Vaccine (6 - Moderna risk series) 12/04/2021   Pneumonia Vaccine [age]Years old (1 - PCV) Never done   DEXA SCAN  Never done   FOOT EXAM  03/20/2022   INFLUENZA VACCINE  07/24/2022   OPHTHALMOLOGY EXAM  08/17/2022   URINE MICROALBUMIN  09/21/2022    Colorectal cancer screening: Type of screening: Colonoscopy. Completed 08/28/2017. Repeat every 10 years  Mammogram status: Completed 12/19/2021. Repeat every year  Bone Density status: Ordered 08/22/2022. Pt provided with contact info and advised to call to schedule appt.  Lung Cancer Screening: (Low Dose CT Chest recommended if Age 66-80ears, 30 pack-year currently smoking OR have quit w/in 15years.) does not qualify.   Lung Cancer Screening  Referral: n/a  Additional Screening:  Hepatitis C Screening: does not qualify; Completed 09/06/2017  Vision Screening: Recommended annual ophthalmology exams for early detection of glaucoma and other disorders of the eye. Is the patient up to date with their annual eye exam?  Yes  Who is the provider or what is the name of the office in which the patient attends annual eye exams? [place] is not established with a provider, would they like to be referred to a provider to establish care? N/a.   Dental Screening: Recommended annual dental exams for proper oral hygiene  Community Resource Referral / Chronic Care Management: CRR required this visit?  No   CCM required this visit?  No      Plan:     I have personally reviewed and noted the following in the patient's chart:   Medical and social history Use of alcohol, tobacco or illicit drugs  Current medications and supplements including opioid prescriptions. Patient is not currently taking opioid prescriptions. Functional ability and status Nutritional status Physical activity Advanced directives List of other physicians Hospitalizations, surgeries, and ER visits in previous 12 months Vitals Screenings to include cognitive, depression, and falls Referrals and appointments  In addition, I have reviewed and discussed with patient certain preventive protocols, quality metrics, and best practice recommendations. A written personalized care plan for preventive services as well as general preventive health recommendations were provided to patient.     Royal Hawthorn, [place]   08/21/2022   Nurse Notes: Face to face 28 minutes spent   Ms. Bettina Gavia , Thank you for taking time to come for your Medicare Wellness Visit. I appreciate your ongoing commitment to your health goals. Please review the following plan we discussed and let me know if I can assist you in the future.   These are the goals we discussed:  Goals       DIET - INCREASE WATER INTAKE     Recommend to drink at least 6-8 8oz glasses of water per day.        This is a list of the screening recommended for you and due dates:  Health Maintenance  Topic Date Due   COVID-19 Vaccine (6 - Moderna risk series) 12/04/2021   Pneumonia Vaccine (1 - PCV) Never done   DEXA scan (bone density measurement)  Never done   Complete foot exam   03/20/2022   Flu Shot  07/24/2022   Eye exam for diabetics  08/17/2022   Urine Protein Check  09/21/2022   Hemoglobin A1C  09/20/2022   Mammogram  12/19/2022   Pap Smear  02/16/2025   Tetanus Vaccine  12/24/2025   Colon Cancer Screening  08/29/2027   Hepatitis C Screening: USPSTF Recommendation to screen - Ages 18-79 yo.  Completed   HIV Screening  Completed   Zoster (Shingles) Vaccine  Completed   HPV Vaccine  Aged Out

## 2022-08-28 NOTE — Progress Notes (Unsigned)
Name: Ashlee Richardson   MRN: [id number]    DOB: [birth year]   Date:08/29/2022       Progress Note  Subjective  Chief Complaint  Follow Up  HPI  MS: under the care of Dr. Manuella Ghazi , had a positive IG for herpes on spinal tap but was given reassurance by Dr. Ola Spurr. She denies weakness, but has intermittent tingling an numbness on both arms. Stable and not on medication. MRI showed white plaques. She has some tremors and sometimes decrease in memory, she is still taking Duloxetine but only prn , she was given Aricept for her memory but she stopped taking it. She is still not taking medications for MS  Left nipple discharge: she noticed over the past few months bloody nipple discharge, she is not absolutely sure of how long ago , she states it has been staining her bra. No Denies trauma or tenderness, no masses. She had a normal mammogram Dec 2022 , and normal diagnostic mammogram in June, no more nipple discharge in months    Chronic back pain and neck pain, also has FMS: used to see Dr. Primus Bravo. She went to [facility] with severe back pain and feeling bloated early March, since than she was seen by Dr. Lorin Mercy and per his note the two-level fusion looks good but has progressive degenerative disease above it, she had PT and it helped initially but she is back to baseline and thinks she will need to go back for another back surgery.  She continues to have daily pain , she has muscle aches due to Fibromyalgia    GERD and gastritis: seen by Dr. Marius Ditch . She was diagnosed with hiatal hernia, she also has a history of constipation, she tried Linzess but caused diarrhea, she was advised to take Miralax instead. She has been out of Dexilant, she will contact Dr. Marius Ditch   Insomnia: she states Trazodone helps her sleep, she states she does not sleep well. Discussed changing medication to Seroquel , but she is afraid due to being off label indication for sleep. " I don't want anything that is used for bipolar"   RLS:  taking Requip from neurologist, she states she continues to have cramps, she is taking Lyrica prn and advised to take it every night   DM: A1C was 6.6 % back in September 2021, stayed at 6.6 % but today is down to 5.8  % with life style modifications.  She denies polyphagia, polydipsia but she has urinary frequency. She stopped crestor but is taking zetia. She is avoid sweets, sodas and cutting down on bread .    Angina Pectoris: seen by Dr. Clayborn Bigness, she stopped taking Crestor due to cramps - advised to try half dose and continue  Zetia, last LDL at goal.  She also had a normal NM myocardial perfusion stress test on 09/12/21 . Denies recent episodes of chest pain   Atherosclerosis aorta: on statin therapy, tolerating it well , last LDL at goal at 70 , we will recheck it today   Change in bowel movements: she continues to have some urgency and lose stools in the mornings, under the care of Dr. Marius Ditch, she has an upcoming appointment, weight is stable   FMS: she has aches and pains all day, she has been taking Duloxetine and Lyrica but only prn, advised to try taking it daily  , her pain is all over 6/10 today.   Migraine headaches: she sees neurologist , she  is now on Qulipta 60  mg daily and episodes are down a couple of times a month.   B12/Vitamin D deficiency: she is out of supplements  Asthma: she states that she continues to have intermittent wheezing, hot flashes, a cough that is sometimes dry and sometimes wet - like clear or yellow  going on for months. We gave her Advair but she has not been using it on a regular basis, explained that it may help with her symptoms.   Patient Active Problem List   Diagnosis Date Noted   Type 2 diabetes mellitus with pressure callus ([place]) 03/20/2021   Diabetes mellitus type 2, diet-controlled ([place]) 03/20/2021   Onychomycosis of multiple toenails with type 2 diabetes mellitus ([place]) 03/20/2021   Chronic idiopathic constipation 03/20/2021   Lumbar  foraminal stenosis 03/08/2021   Myalgia due to statin 05/26/2020   History of lumbar fusion 11/22/2019   GERD (gastroesophageal reflux disease) 04/28/2018   Pure hypercholesterolemia 04/28/2018   Diverticulosis 04/28/2018   RLS (restless legs syndrome) 04/28/2018   Angina pectoris ([place]) 04/28/2018   Vitamin D deficiency 12/27/2017   Chronic fatigue, unspecified 12/27/2017   Intractable migraine without aura and without status migrainosus 10/17/2015   DDD (degenerative disc disease), cervical 05/23/2015   Bilateral occipital neuralgia 05/23/2015   DDD (degenerative disc disease), lumbar 05/23/2015   Multiple sclerosis ([place]) 05/23/2015   Cervical disc disorder with radiculopathy of cervical region 04/20/2015   Right arm numbness 04/20/2015   Right arm weakness 04/20/2015    Past Surgical History:  Procedure Laterality Date   BACK SURGERY  1991?   Pt usure of date   BREAST BIOPSY Right    COLONOSCOPY  09/2007   COLONOSCOPY WITH PROPOFOL N/A 08/28/2017   Procedure: COLONOSCOPY WITH PROPOFOL;  Surgeon: Christene Lye, MD;  Location: [facility] ENDOSCOPY;  Service: Endoscopy;  Laterality: N/A;   ESOPHAGOGASTRODUODENOSCOPY (EGD) WITH PROPOFOL N/A 11/24/2018   Procedure: ESOPHAGOGASTRODUODENOSCOPY (EGD) WITH PROPOFOL;  Surgeon: Lin Landsman, MD;  Location: [facility] ENDOSCOPY;  Service: Gastroenterology;  Laterality: N/A;   Foot sugery Right    3rd toe from the right"    Family History  Problem Relation Age of Onset   Heart disease Mother    Diabetes Mother    Kidney disease Mother    Hypertension Mother    Heart disease Father    Hyperlipidemia Father    Hypertension Father    Heart disease Other    Cancer Other        Breast   Diabetes Other    Hyperlipidemia Sister    Hypertension Sister    Kidney disease Sister    Diabetes Sister    Lung cancer Paternal Aunt    Cancer Maternal Grandmother        breast    Social History   Tobacco Use   Smoking status: Never    Smokeless tobacco: Never   Tobacco comments:    smoking cessation materials not required  Substance Use Topics   Alcohol use: No    Alcohol/week: 0.0 standard drinks of alcohol     Current Outpatient Medications:    Calcium-Magnesium-Vitamin D 300-150-400 MG-MG-UNIT TABS, Take by mouth., Disp: , Rfl:    cholecalciferol (VITAMIN D3) 25 MCG (1000 UT) tablet, Take 1,000 Units by mouth daily., Disp: , Rfl:    DULoxetine (CYMBALTA) 60 MG capsule, Take 60 mg by mouth daily., Disp: , Rfl:    ezetimibe (ZETIA) 10 MG tablet, TAKE 1 TABLET BY MOUTH EVERY DAY, Disp: 90 tablet, Rfl: 1  fluticasone-salmeterol (ADVAIR) 100-50 MCG/ACT AEPB, Inhale 1 puff into the lungs 2 (two) times daily., Disp: 60 each, Rfl: 5   magnesium oxide (MAG-OX) 400 MG tablet, Take 400 mg by mouth daily., Disp: , Rfl:    meclizine (ANTIVERT) 25 MG tablet, Take 25 mg by mouth 3 (three) times daily as needed for dizziness., Disp: , Rfl:    meloxicam (MOBIC) 15 MG tablet, TAKE 1 TABLET BY MOUTH EVERY DAY, Disp: 90 tablet, Rfl: 0   mirabegron ER (MYRBETRIQ) 50 MG TB24 tablet, Take 1 tablet (50 mg total) by mouth daily., Disp: 30 tablet, Rfl: 11   omeprazole (PRILOSEC) 20 MG capsule, Take 20 mg by mouth daily., Disp: , Rfl:    pregabalin (LYRICA) 50 MG capsule, Take 1-2 capsules (50-100 mg total) by mouth at bedtime., Disp: 60 capsule, Rfl: 2   QULIPTA 60 MG TABS, Take 1 tablet by mouth daily., Disp: , Rfl:    rosuvastatin (CRESTOR) 20 MG tablet, TAKE 1 TABLET BY MOUTH EVERY DAY, Disp: 90 tablet, Rfl: 0   SUMAtriptan (IMITREX) 100 MG tablet, Take as directed PRN migraines, Disp: , Rfl:    tiZANidine (ZANAFLEX) 2 MG tablet, TAKE 1 TABLET (2 MG TOTAL) BY MOUTH 3 (THREE) TIMES DAILY., Disp: , Rfl:    traZODone (DESYREL) 50 MG tablet, TAKE 0.5-1 TABLETS (25-50 MG TOTAL) BY MOUTH AT BEDTIME AS NEEDED. FOR SLEEP, Disp: 90 tablet, Rfl: 0   vitamin B-12 (CYANOCOBALAMIN) 1000 MCG tablet, Take 1,000 mcg by mouth daily., Disp: , Rfl:     vitamin E 180 MG (400 UNITS) capsule, Take 400 Units by mouth daily., Disp: , Rfl:   Allergies  Allergen Reactions   Diclofenac-Misoprostol Other (See Comments)    "Heart racing"   Fenofibric Acid    Lipitor [Atorvastatin]    Nsaids     I personally reviewed active problem list, medication list, allergies, family history, social history, health maintenance with the patient/caregiver today.   ROS  Ten systems reviewed and is negative except as mentioned in HPI  Objective  Vitals:   08/29/22 0820  BP: 116/70  Pulse: 81  Resp: 16  SpO2: 98%  Weight: 180 lb (81.6 kg)  Height: '5\' 8"'$  (1.727 m)    Body mass index is 27.37 kg/m.  Physical Exam  Constitutional: Patient appears well-developed and well-nourished.  No distress.  HEENT: head atraumatic, normocephalic, pupils equal and reactive to light,  neck supple Cardiovascular: Normal rate, regular rhythm and normal heart sounds.  No murmur heard. No BLE edema. Pulmonary/Chest: Effort normal and breath sounds normal. No respiratory distress. Abdominal: Soft.  There is no tenderness. Psychiatric: seems depressed , cooperative  Recent Results (from the past 2160 hour(s))  Brain natriuretic peptide     Status: None   Collection Time: 07/12/22  9:12 AM  Result Value Ref Range   B Natriuretic Peptide 17.1 0.0 - 100.0 pg/mL    Comment: Performed at [facility], [address] [place], [place] [address]  POCT HgB A1C     Status: Abnormal   Collection Time: 08/29/22  8:21 AM  Result Value Ref Range   Hemoglobin A1C 5.8 (A) 4.0 - 5.6 %   HbA1c POC (<> result, manual entry)     HbA1c, POC (prediabetic range)     HbA1c, POC (controlled diabetic range)      PHQ2/9:    08/29/2022    8:19 AM 08/21/2022    8:09 AM 03/20/2022    8:03 AM 02/16/2022   12:58  PM 09/21/2021    7:50 AM  Depression screen PHQ 2/9  Decreased Interest 0 0 0 0 0  Down, Depressed, Hopeless 0 0 0 0 0  PHQ - 2 Score 0 0 0 0 0  Altered sleeping 1  3 0 0   Tired, decreased energy 3 3 0 0   Change in appetite 0 2 0 0   Feeling bad or failure about yourself  0 0 0 0   Trouble concentrating 0 0 0 0   Moving slowly or fidgety/restless 0 1 0 0   Suicidal thoughts 0 0 0 0   PHQ-9 Score 4 9 0 0   Difficult doing work/chores  Somewhat difficult  Not difficult at all     phq 9 is negative   Fall Risk:    08/29/2022    8:19 AM 08/21/2022    8:13 AM 03/20/2022    8:03 AM 02/16/2022   12:58 PM 09/21/2021    7:49 AM  Fall Risk   Falls in the past year? 0 0 0 0 0  Number falls in past yr: 0  0 0 0  Injury with Fall? 0  0 0 0  Risk for fall due to : No Fall Risks No Fall Risks No Fall Risks No Fall Risks No Fall Risks  Follow up Falls prevention discussed Falls evaluation completed;Education provided;Falls prevention discussed Falls prevention discussed Falls prevention discussed Falls prevention discussed      Functional Status Survey: Is the patient deaf or have difficulty hearing?: No Does the patient have difficulty seeing, even when wearing glasses/contacts?: Yes Does the patient have difficulty concentrating, remembering, or making decisions?: Yes Does the patient have difficulty walking or climbing stairs?: No Does the patient have difficulty dressing or bathing?: No Does the patient have difficulty doing errands alone such as visiting a doctor's office or shopping?: No    Assessment & Plan  1. Type 2 diabetes mellitus with pressure callus (HCC)  - POCT HgB A1C - HM Diabetes Foot Exam - COMPLETE METABOLIC PANEL WITH GFR - Microalbumin / creatinine urine ratio  2. MS (multiple sclerosis) ([place])   3. Atherosclerosis of aorta (HCC)  - Lipid panel  4. Angina pectoris (HCC)  - ezetimibe (ZETIA) 10 MG tablet; Take 1 tablet (10 mg total) by mouth daily.  Dispense: 90 tablet; Refill: 1  5. Fibromyalgia   6. Dyslipidemia  - ezetimibe (ZETIA) 10 MG tablet; Take 1 tablet (10 mg total) by mouth daily.  Dispense: 90  tablet; Refill: 1  7. B12 deficiency  - Vitamin B12  8. Vitamin D deficiency  - VITAMIN D 25 Hydroxy (Vit-D Deficiency, Fractures)  9. RLS (restless legs syndrome)  - CBC with Differential/Platelet  10. Mild persistent reactive airway disease without complication  - futicasone-salmeterol (ADVAIR) 100-50 MCG/ACT AEPB; Inhale 1 puff into the lungs 2 (two) times daily.  Dispense: 60 each; Refill: 5  11. Need for pneumococcal 20-valent conjugate vaccination  - Pneumococcal conjugate vaccine 20-valent (Prevnar 20)  12. Long-term use of high-risk medication  - CBC with Differential/Platelet - COMPLETE METABOLIC PANEL WITH GFR

## 2022-08-29 ENCOUNTER — Ambulatory Visit (INDEPENDENT_AMBULATORY_CARE_PROVIDER_SITE_OTHER): Payer: Medicare Other | Admitting: Family Medicine

## 2022-08-29 ENCOUNTER — Encounter: Payer: Self-pay | Admitting: Family Medicine

## 2022-08-29 VITALS — BP 116/70 | HR 81 | Resp 16 | Ht 68.0 in | Wt 180.0 lb

## 2022-08-29 DIAGNOSIS — G35 Multiple sclerosis: Secondary | ICD-10-CM

## 2022-08-29 DIAGNOSIS — J4530 Mild persistent asthma, uncomplicated: Secondary | ICD-10-CM

## 2022-08-29 DIAGNOSIS — E11628 Type 2 diabetes mellitus with other skin complications: Secondary | ICD-10-CM | POA: Diagnosis not present

## 2022-08-29 DIAGNOSIS — L84 Corns and callosities: Secondary | ICD-10-CM

## 2022-08-29 DIAGNOSIS — G2581 Restless legs syndrome: Secondary | ICD-10-CM

## 2022-08-29 DIAGNOSIS — I700 Atherosclerosis of aorta: Secondary | ICD-10-CM

## 2022-08-29 DIAGNOSIS — Z79899 Other long term (current) drug therapy: Secondary | ICD-10-CM

## 2022-08-29 DIAGNOSIS — E785 Hyperlipidemia, unspecified: Secondary | ICD-10-CM

## 2022-08-29 DIAGNOSIS — I209 Angina pectoris, unspecified: Secondary | ICD-10-CM

## 2022-08-29 DIAGNOSIS — E559 Vitamin D deficiency, unspecified: Secondary | ICD-10-CM

## 2022-08-29 DIAGNOSIS — M797 Fibromyalgia: Secondary | ICD-10-CM

## 2022-08-29 DIAGNOSIS — Z23 Encounter for immunization: Secondary | ICD-10-CM

## 2022-08-29 DIAGNOSIS — E538 Deficiency of other specified B group vitamins: Secondary | ICD-10-CM

## 2022-08-29 LAB — POCT GLYCOSYLATED HEMOGLOBIN (HGB A1C): Hemoglobin A1C: 5.8 % — AB (ref 4.0–5.6)

## 2022-08-29 MED ORDER — FLUTICASONE-SALMETEROL 100-50 MCG/ACT IN AEPB: 1.0000 | INHALATION_SPRAY | Freq: Two times a day (BID) | RESPIRATORY_TRACT | 5 refills | Status: DC

## 2022-08-29 MED ORDER — EZETIMIBE 10 MG PO TABS: 10.0000 mg | ORAL_TABLET | Freq: Every day | ORAL | 1 refills | Status: DC

## 2022-08-30 LAB — CBC WITH DIFFERENTIAL/PLATELET
Absolute Monocytes: 291 cells/uL (ref 200–950)
Basophils Absolute: 82 cells/uL (ref 0–200)
Basophils Relative: 2 %
Eosinophils Absolute: 328 cells/uL (ref 15–500)
Eosinophils Relative: 8 %
HCT: 39.9 % (ref 35.0–45.0)
Hemoglobin: 13.1 g/dL (ref 11.7–15.5)
Lymphs Abs: 1861 cells/uL (ref 850–3900)
MCH: 27.8 pg (ref 27.0–33.0)
MCHC: 32.8 g/dL (ref 32.0–36.0)
MCV: 84.5 fL (ref 80.0–100.0)
MPV: 12.3 fL (ref 7.5–12.5)
Monocytes Relative: 7.1 %
Neutro Abs: 1538 cells/uL (ref 1500–7800)
Neutrophils Relative %: 37.5 %
Platelets: 204 10*3/uL (ref 140–400)
RBC: 4.72 10*6/uL (ref 3.80–5.10)
RDW: 12.5 % (ref 11.0–15.0)
Total Lymphocyte: 45.4 %
WBC: 4.1 10*3/uL (ref 3.8–10.8)

## 2022-08-30 LAB — COMPLETE METABOLIC PANEL WITH GFR
AG Ratio: 1.9 (calc) (ref 1.0–2.5)
ALT: 14 U/L (ref 6–29)
AST: 15 U/L (ref 10–35)
Albumin: 4.4 g/dL (ref 3.6–5.1)
Alkaline phosphatase (APISO): 58 U/L (ref 37–153)
BUN: 11 mg/dL (ref 7–25)
CO2: 26 mmol/L (ref 20–32)
Calcium: 9.7 mg/dL (ref 8.6–10.4)
Chloride: 106 mmol/L (ref 98–110)
Creat: 0.73 mg/dL (ref 0.50–1.05)
Globulin: 2.3 g/dL (calc) (ref 1.9–3.7)
Glucose, Bld: 100 mg/dL — ABNORMAL HIGH (ref 65–99)
Potassium: 4.8 mmol/L (ref 3.5–5.3)
Sodium: 141 mmol/L (ref 135–146)
Total Bilirubin: 0.6 mg/dL (ref 0.2–1.2)
Total Protein: 6.7 g/dL (ref 6.1–8.1)
eGFR: 91 mL/min/{1.73_m2} (ref >=60)

## 2022-08-30 LAB — LIPID PANEL
Cholesterol: 256 mg/dL — ABNORMAL HIGH (ref <200)
HDL: 73 mg/dL (ref >=50)
LDL Cholesterol (Calc): 156 mg/dL (calc) — ABNORMAL HIGH
Non-HDL Cholesterol (Calc): 183 mg/dL (calc) — ABNORMAL HIGH (ref <130)
Total CHOL/HDL Ratio: 3.5 (calc) (ref <5.0)
Triglycerides: 144 mg/dL (ref <150)

## 2022-08-30 LAB — VITAMIN B12: Vitamin B-12: 498 pg/mL (ref 200–1100)

## 2022-08-30 LAB — VITAMIN D 25 HYDROXY (VIT D DEFICIENCY, FRACTURES): Vit D, 25-Hydroxy: 34 ng/mL (ref 30–100)

## 2022-08-30 LAB — MICROALBUMIN / CREATININE URINE RATIO
Creatinine, Urine: 193 mg/dL (ref 20–275)
Microalb Creat Ratio: 4 mcg/mg creat (ref <30)
Microalb, Ur: 0.7 mg/dL

## 2022-09-17 ENCOUNTER — Ambulatory Visit
Admission: RE | Admit: 2022-09-17 | Discharge: 2022-09-17 | Disposition: A | Discharge status: Home / Self Care | Payer: Medicare Other | Source: Ambulatory Visit | Attending: Family Medicine | Admitting: Family Medicine

## 2022-09-17 DIAGNOSIS — Z780 Asymptomatic menopausal state: Secondary | ICD-10-CM | POA: Diagnosis not present

## 2022-09-17 DIAGNOSIS — G35 Multiple sclerosis: Secondary | ICD-10-CM | POA: Insufficient documentation

## 2022-09-17 DIAGNOSIS — Z13820 Encounter for screening for osteoporosis: Secondary | ICD-10-CM | POA: Insufficient documentation

## 2022-11-22 ENCOUNTER — Other Ambulatory Visit: Payer: Self-pay

## 2022-11-26 ENCOUNTER — Encounter: Payer: Self-pay | Admitting: Gastroenterology

## 2022-11-26 ENCOUNTER — Ambulatory Visit (INDEPENDENT_AMBULATORY_CARE_PROVIDER_SITE_OTHER): Payer: Medicare Other | Admitting: Gastroenterology

## 2022-11-26 VITALS — BP 143/71 | HR 75 | Temp 98.4°F | Ht 68.0 in | Wt 178.2 lb

## 2022-11-26 DIAGNOSIS — R1030 Lower abdominal pain, unspecified: Secondary | ICD-10-CM

## 2022-11-26 DIAGNOSIS — R1013 Epigastric pain: Secondary | ICD-10-CM

## 2022-11-26 DIAGNOSIS — R195 Other fecal abnormalities: Secondary | ICD-10-CM | POA: Diagnosis not present

## 2022-11-26 NOTE — Progress Notes (Signed)
Cephas Darby, MD [address]  [place], [place] [address]  Main: [phone]  Fax: [phone]    Gastroenterology Consultation  Referring Provider:     Steele Sizer, MD Primary Care Physician:  Steele Sizer, MD Primary Gastroenterologist:  Dr. Cephas Darby Reason for Consultation: Lower abdominal discomfort, abdominal bloating, loose stools and dyspepsia        HPI:   Ashlee Richardson is a 66 y.o. female referred by Dr. Steele Sizer, MD  for consultation & management of chronic lower abdominal pain and bloating.  Patient has been originally evaluated by me in 2021 and then lost to follow-up.  She reports that she has been dealing with symptoms of chronic lower abdominal pain associated with feeling gassy, loose mushy bowel movements but no diarrhea.  She was having diarrhea when she was on antibiotics for upper respiratory infection.  She reports that she is no longer experiencing constipation.  She also reports upper abdominal discomfort with regurgitation and burning in her throat.  She states she is tired of taking medications and wants further workup.  She has lost about 3 to 4 pounds within the last 6 months.  She reports having good appetite.  She denies consumption of carbonated beverages, sweet tea.  She reports drinking about a gallon of water daily.  She does not smoke or drink alcohol.  Her workup in the past included CT abdomen pelvis with contrast which revealed sigmoid diverticulosis only. GI pathogen panel including C. difficile was negative in July 2023   NSAIDs: Mobic for chronic back pain  Antiplts/Anticoagulants/Anti thrombotics: none  GI Procedures: colonoscopy 2018 by Dr Jamal Collin Diverticulosis, otherwise normal colon Colonoscopy in 09/2007  EGD 11/24/2018 - Normal duodenal bulb, second portion of the duodenum and third portion of the duodenum. - 1 cm hiatal hernia. - Normal stomach. Biopsied. - Esophagogastric landmarks  identified. - Normal gastroesophageal junction and esophagus. Biopsied.  DIAGNOSIS:  A.  STOMACH, RANDOM; COLD BIOPSY:  - FOCAL MILD NON-SPECIFIC CHRONIC GASTRITIS.  - NEGATIVE FOR H. PYLORI, DYSPLASIA, AND MALIGNANCY.   B.  ESOPHAGUS; COLD BIOPSY:  - UNREMARKALBE SQUAMOUS MUCOSA.  - NEGATIVE FOR EOSINOPHILS, DYSPLASIA, AND MALIGNANCY.  - FRAGMENT OF UNREMARKABLE GASTRIC MUCOSA.    Past Medical History:  Diagnosis Date   B12 deficiency 12/27/2017   Diverticulitis    Fibromyalgia    GERD (gastroesophageal reflux disease)    High cholesterol    Migraine    MS (multiple sclerosis) (HCC)    PUD (peptic ulcer disease) 04/28/2018    Past Surgical History:  Procedure Laterality Date   BACK SURGERY  1991?   Pt usure of date   BREAST BIOPSY Right    COLONOSCOPY  09/2007   COLONOSCOPY WITH PROPOFOL N/A 08/28/2017   Procedure: COLONOSCOPY WITH PROPOFOL;  Surgeon: Christene Lye, MD;  Location: [facility] ENDOSCOPY;  Service: Endoscopy;  Laterality: N/A;   ESOPHAGOGASTRODUODENOSCOPY (EGD) WITH PROPOFOL N/A 11/24/2018   Procedure: ESOPHAGOGASTRODUODENOSCOPY (EGD) WITH PROPOFOL;  Surgeon: Lin Landsman, MD;  Location: [facility] ENDOSCOPY;  Service: Gastroenterology;  Laterality: N/A;   Foot sugery Right    3rd toe from the right"    Current Outpatient Medications:    Calcium-Magnesium-Vitamin D 300-150-400 MG-MG-UNIT TABS, Take by mouth., Disp: , Rfl:    cholecalciferol (VITAMIN D3) 25 MCG (1000 UT) tablet, Take 1,000 Units by mouth daily., Disp: , Rfl:    DPH-Lido-AlHydr-MgHydr-Simeth (FIRST-MOUTHWASH BLM) SUSP, Swish and spit 5 mLs every 4 (four) hours as  needed, Disp: , Rfl:    DULoxetine (CYMBALTA) 60 MG capsule, Take 60 mg by mouth daily., Disp: , Rfl:    ezetimibe (ZETIA) 10 MG tablet, Take 1 tablet (10 mg total) by mouth daily., Disp: 90 tablet, Rfl: 1   fluticasone-salmeterol (ADVAIR) 100-50 MCG/ACT AEPB, Inhale 1 puff into the lungs 2 (two) times daily., Disp: 60 each, Rfl: 5    magnesium oxide (MAG-OX) 400 MG tablet, Take 400 mg by mouth daily., Disp: , Rfl:    meclizine (ANTIVERT) 25 MG tablet, Take 25 mg by mouth 3 (three) times daily as needed for dizziness., Disp: , Rfl:    meloxicam (MOBIC) 15 MG tablet, TAKE 1 TABLET BY MOUTH EVERY DAY, Disp: 90 tablet, Rfl: 0   mirabegron ER (MYRBETRIQ) 50 MG TB24 tablet, Take 1 tablet (50 mg total) by mouth daily., Disp: 30 tablet, Rfl: 11   omeprazole (PRILOSEC) 20 MG capsule, Take 20 mg by mouth daily., Disp: , Rfl:    pregabalin (LYRICA) 50 MG capsule, Take 1-2 capsules (50-100 mg total) by mouth at bedtime., Disp: 60 capsule, Rfl: 2   QULIPTA 60 MG TABS, Take 1 tablet by mouth daily., Disp: , Rfl:    SUMAtriptan (IMITREX) 100 MG tablet, Take as directed PRN migraines, Disp: , Rfl:    tiZANidine (ZANAFLEX) 2 MG tablet, TAKE 1 TABLET (2 MG TOTAL) BY MOUTH 3 (THREE) TIMES DAILY., Disp: , Rfl:    valACYclovir (VALTREX) 500 MG tablet, Take 1 tablet by mouth 2 (two) times daily., Disp: , Rfl:    vitamin B-12 (CYANOCOBALAMIN) 1000 MCG tablet, Take 1,000 mcg by mouth daily., Disp: , Rfl:    vitamin E 180 MG (400 UNITS) capsule, Take 400 Units by mouth daily., Disp: , Rfl:    Family History  Problem Relation Age of Onset   Heart disease Mother    Diabetes Mother    Kidney disease Mother    Hypertension Mother    Heart disease Father    Hyperlipidemia Father    Hypertension Father    Heart disease Other    Cancer Other        Breast   Diabetes Other    Hyperlipidemia Sister    Hypertension Sister    Kidney disease Sister    Diabetes Sister    Lung cancer Paternal Aunt    Cancer Maternal Grandmother        breast     Social History   Tobacco Use   Smoking status: Never   Smokeless tobacco: Never   Tobacco comments:    smoking cessation materials not required  Vaping Use   Vaping Use: Never used  Substance Use Topics   Alcohol use: No    Alcohol/week: 0.0 standard drinks of alcohol   Drug use: No     Allergies as of 11/26/2022 - Review Complete 08/29/2022  Allergen Reaction Noted   Diclofenac-misoprostol Other (See Comments) 08/15/2021   Fenofibric acid  04/20/2015   Lipitor [atorvastatin]  04/20/2015   Nsaids  04/20/2015    Review of Systems:    All systems reviewed and negative except where noted in HPI.   Physical Exam:  BP (!) 143/71 (BP Location: Right Arm, Patient Position: Sitting, Cuff Size: Normal)   Pulse 75   Temp 98.4 F (36.9 C) (Oral)   Ht '5\' 8"'$  (1.727 m)   Wt 178 lb 4 oz (80.9 kg)   BMI 27.10 kg/m  No LMP recorded. Patient is postmenopausal.  General:   Alert,  Well-developed, well-nourished, pleasant and cooperative in NAD, patient is uncomfortable sitting due to low back pain Head:  Normocephalic and atraumatic. Eyes:  Sclera clear, no icterus.   Conjunctiva pink. Ears:  Normal auditory acuity. Nose:  No deformity, discharge, or lesions. Mouth:  No deformity or lesions,oropharynx pink & moist. Neck:  Supple; no masses or thyromegaly. Lungs:  Respirations even and unlabored.  Clear throughout to auscultation.   No wheezes, crackles, or rhonchi. No acute distress. Heart:  Regular rate and rhythm; no murmurs, clicks, rubs, or gallops. Abdomen:  Normal bowel sounds. Soft, moderately distended, tympanic to percussion, nontender, without masses, hepatosplenomegaly or hernias noted.  No guarding or rebound tenderness.   Rectal: Solid stool in the rectal vault Msk:  Symmetrical without gross deformities. Good, equal movement & strength bilaterally. Pulses:  Normal pulses noted. Extremities:  No clubbing or edema.  No cyanosis. Neurologic:  Alert and oriented x3;  grossly normal neurologically. Skin:  Intact without significant lesions or rashes. No jaundice. Psych:  Alert and cooperative. Normal mood and affect.  Imaging Studies: Reviewed  Assessment and Plan:   Ashlee Richardson is a 66 y.o. African-American female with history of chronic lower  abdominal discomfort associated with flatulence, loose mushy stools, symptoms of dyspepsia and heartburn  Recommend to check pancreatic fecal elastase levels and H. pylori breath test Check GI profile PCR If above workup is negative, recommend upper endoscopy and colonoscopy with gastric and duodenal biopsies, possible TI evaluation and random colon biopsies Patient was previously on [place] for GERD and she does not want to try any acid suppressive medication until further workup   Follow up in 3 months  Cephas Darby, MD

## 2022-12-01 LAB — H. PYLORI BREATH TEST: H pylori Breath Test: NEGATIVE

## 2022-12-05 ENCOUNTER — Telehealth: Payer: Self-pay

## 2022-12-05 DIAGNOSIS — R195 Other fecal abnormalities: Secondary | ICD-10-CM

## 2022-12-05 LAB — PANCREATIC ELASTASE, FECAL: Pancreatic Elastase, Fecal: 114 ug Elast./g — ABNORMAL LOW

## 2022-12-05 LAB — GI PROFILE, STOOL, PCR

## 2022-12-05 NOTE — Telephone Encounter (Signed)
Per Test results test not performed. Specimen received over filled.

## 2022-12-05 NOTE — Telephone Encounter (Signed)
-----   Message from Lin Landsman, MD sent at 12/05/2022 12:20 PM EST ----- Can you please have her do stool specimen for GI profile PCR, it got cancelled  RV

## 2022-12-05 NOTE — Telephone Encounter (Signed)
Patient verbalized understanding and states she will  come Monday to the lab

## 2022-12-11 LAB — GI PROFILE, STOOL, PCR

## 2022-12-12 ENCOUNTER — Telehealth: Payer: Self-pay

## 2022-12-12 MED ORDER — PANCRELIPASE (LIP-PROT-AMYL) 36000-114000 UNITS PO CPEP: ORAL_CAPSULE | ORAL | 2 refills | Status: AC

## 2022-12-12 NOTE — Telephone Encounter (Signed)
Patient verbalized understanding of results. Sent medication to the pharmacy  

## 2022-12-12 NOTE — Telephone Encounter (Signed)
-----   Message from Lin Landsman, MD sent at 12/11/2022  5:02 PM EST ----- The stool studies came back negative for infection.  Her pancreatic fecal elastase levels are moderately low suggesting she has pancreatic insufficiency which explains diarrhea.  Recommend to start Creon 36 K or Zenpep 40 K 1 capsule with first bite of each meal and 1 with snack  RV

## 2023-02-21 NOTE — Telephone Encounter (Signed)
Error

## 2023-02-24 ENCOUNTER — Other Ambulatory Visit: Payer: Self-pay | Admitting: Family Medicine

## 2023-02-24 DIAGNOSIS — E785 Hyperlipidemia, unspecified: Secondary | ICD-10-CM

## 2023-02-24 DIAGNOSIS — I209 Angina pectoris, unspecified: Secondary | ICD-10-CM

## 2023-02-27 NOTE — Progress Notes (Signed)
Name: Ashlee Richardson   MRN: [id number]    DOB: [birth year]   Date:02/28/2023       Progress Note  Subjective  Chief Complaint  Follow Up  HPI  MS: under the care of Dr. Sherryll Burger , had a positive IG for herpes on spinal tap but was given reassurance by Dr. Sampson Goon. She denies weakness, but has intermittent tingling an numbness on both arms. MRI showed white plaques. She has some tremors and sometimes decrease in memory, she takes Duloxetine but only prn , she was given Aricept for her memory but she stopped taking it. She is still not taking medications for MS. She is having daily headaches lately, mild but nagging and sometimes associated with nausea, advised her to go back to neurologist   Left nipple discharge: she noticed over the past few months bloody nipple discharge, she is not absolutely sure of how long ago , she states it has been staining her bra. No Denies trauma or tenderness, no masses. She had a normal mammogram Dec 2022 , and normal diagnostic mammogram in June 2023, see below. She canceled her appointment that was made for Dec, she re-scheduled for May   ASSESSMENT:  BI-RADS Category: 1-Mammo29mo : Negative.  Routine annual mammography due in 6 months.   Recommendation Laterality: Left  1. Annual bilateral routine screening mammogram is recommended for which patient will be due in December 2023.  2. Recommend continued clinical follow-up of the nipple discharge in the left breast with repeat targeted imaging based on physical exam findings. Additionally, a breast MRI can be considered for further evaluation of the nipple discharge. Patient can also be referred to breast surgery for further evaluation: [phone].   Chronic back pain and neck pain, also has FMS:she was seeing Dr. Ophelia Charter and per his note the two-level fusion looks good but has progressive degenerative disease above it, she had PT and it helped initially but she is back to baseline and thinks she will need to go  back for another back surgery. She continues to have daily pain and will contact him. She takes Lyrica , Meloxicam and Duloxetine but not consistently    GERD and gastritis: seen by Dr. Allegra Lai . She was diagnosed with hiatal hernia, she also has a history of constipation, she tried Linzess but caused diarrhea, she was advised to take Miralax instead. She is now taking Omeprazole and Creon enzymes.    Insomnia: she states Trazodone helps her sleep, she states she does not sleep well. Discussed changing medication to Seroquel , but she is afraid due to being off label indication for sleep and she said she did not want to take anything for bipolar. Not on medications for sleep at this time    RLS: taking Requip from neurologist, she states she continues to have cramps, she is taking Lyrica prn . Symptoms are stable   DM: A1C was 6.6 % back in September 2021, stayed at 6.6 % down to  5.8  % and today is 6.2 % with life style modifications.  She denies polyphagia, polydipsia but she has urinary frequency. She stopped crestor but is taking zetia. She is avoid sweets, sodas and cutting down on bread . Eating more salads She also has pressure callus and will follow up with podiatrist    Angina Pectoris: seen by Dr. Juliann Pares, she stopped taking Crestor due to cramps - she is now taking half dose of crestor and  Zetia, last LDL at goal.  She also had  a normal NM myocardial perfusion stress test on 09/12/21 . Denies recent episodes of chest pain She will follow up with Dr. Juliann Pares   Atherosclerosis aorta: she is taking half of crestor and one zetia, last time LDL was up from 70 to 156, but at the time she was not taking crestor    Migraine headaches: she sees neurologist , Dr. Sherryll Burger, she  is now on Qulipta 60 mg , she is not sure if she has been taking it daily, she states having daily mild headaches now and takes tylenol prn . Explained importance of seeing Dr. Sherryll Burger again   B12/Vitamin D deficiency: she is out  of supplements, explained importance of resuming taking it   Asthma: she has a daily cough since COVID in 2023 but wheezing resolved, she has intermittent SOB . She has Advair prn only.   Patient Active Problem List   Diagnosis Date Noted   Type 2 diabetes mellitus with pressure callus (HCC) 03/20/2021   Diabetes mellitus type 2, diet-controlled (HCC) 03/20/2021   Onychomycosis of multiple toenails with type 2 diabetes mellitus (HCC) 03/20/2021   Chronic idiopathic constipation 03/20/2021   Lumbar foraminal stenosis 03/08/2021   Myalgia due to statin 05/26/2020   History of lumbar fusion 11/22/2019   GERD (gastroesophageal reflux disease) 04/28/2018   Pure hypercholesterolemia 04/28/2018   Diverticulosis 04/28/2018   RLS (restless legs syndrome) 04/28/2018   Angina pectoris (HCC) 04/28/2018   Vitamin D deficiency 12/27/2017   Chronic fatigue, unspecified 12/27/2017   Intractable migraine without aura and without status migrainosus 10/17/2015   DDD (degenerative disc disease), cervical 05/23/2015   Bilateral occipital neuralgia 05/23/2015   DDD (degenerative disc disease), lumbar 05/23/2015   Multiple sclerosis (HCC) 05/23/2015   Cervical disc disorder with radiculopathy of cervical region 04/20/2015   Right arm numbness 04/20/2015    Past Surgical History:  Procedure Laterality Date   BACK SURGERY  1991?   Pt usure of date   BREAST BIOPSY Right    COLONOSCOPY  09/2007   COLONOSCOPY WITH PROPOFOL N/A 08/28/2017   Procedure: COLONOSCOPY WITH PROPOFOL;  Surgeon: Kieth Brightly, MD;  Location: [facility] ENDOSCOPY;  Service: Endoscopy;  Laterality: N/A;   ESOPHAGOGASTRODUODENOSCOPY (EGD) WITH PROPOFOL N/A 11/24/2018   Procedure: ESOPHAGOGASTRODUODENOSCOPY (EGD) WITH PROPOFOL;  Surgeon: Toney Reil, MD;  Location: [facility] ENDOSCOPY;  Service: Gastroenterology;  Laterality: N/A;   Foot sugery Right    3rd toe from the right"    Family History  Problem Relation Age of Onset    Heart disease Mother    Diabetes Mother    Kidney disease Mother    Hypertension Mother    Heart disease Father    Hyperlipidemia Father    Hypertension Father    Heart disease Other    Cancer Other        Breast   Diabetes Other    Hyperlipidemia Sister    Hypertension Sister    Kidney disease Sister    Diabetes Sister    Lung cancer Paternal Aunt    Cancer Maternal Grandmother        breast    Social History   Tobacco Use   Smoking status: Never   Smokeless tobacco: Never   Tobacco comments:    smoking cessation materials not required  Substance Use Topics   Alcohol use: No    Alcohol/week: 0.0 standard drinks of alcohol     Current Outpatient Medications:    Calcium-Magnesium-Vitamin D 300-150-400 MG-MG-UNIT TABS, Take by mouth.,  Disp: , Rfl:    cholecalciferol (VITAMIN D3) 25 MCG (1000 UT) tablet, Take 1,000 Units by mouth daily., Disp: , Rfl:    DULoxetine (CYMBALTA) 60 MG capsule, Take 60 mg by mouth daily., Disp: , Rfl:    ezetimibe (ZETIA) 10 MG tablet, TAKE 1 TABLET BY MOUTH EVERY DAY, Disp: 90 tablet, Rfl: 0   fluticasone-salmeterol (ADVAIR) 100-50 MCG/ACT AEPB, Inhale 1 puff into the lungs 2 (two) times daily., Disp: 60 each, Rfl: 5   lipase/protease/amylase (CREON) 36000 UNITS CPEP capsule, Take 1 tablet with the first bite of each and meal and 1 capsule with the first bite of each snack., Disp: 180 capsule, Rfl: 2   meclizine (ANTIVERT) 25 MG tablet, Take 25 mg by mouth 3 (three) times daily as needed for dizziness., Disp: , Rfl:    meloxicam (MOBIC) 15 MG tablet, TAKE 1 TABLET BY MOUTH EVERY DAY, Disp: 90 tablet, Rfl: 0   omeprazole (PRILOSEC) 20 MG capsule, Take 20 mg by mouth daily., Disp: , Rfl:    pregabalin (LYRICA) 50 MG capsule, Take 1-2 capsules (50-100 mg total) by mouth at bedtime., Disp: 60 capsule, Rfl: 2   QULIPTA 60 MG TABS, Take 1 tablet by mouth daily., Disp: , Rfl:    SUMAtriptan (IMITREX) 100 MG tablet, Take as directed PRN migraines,  Disp: , Rfl:    tiZANidine (ZANAFLEX) 2 MG tablet, TAKE 1 TABLET (2 MG TOTAL) BY MOUTH 3 (THREE) TIMES DAILY., Disp: , Rfl:    valACYclovir (VALTREX) 500 MG tablet, Take 1 tablet by mouth 2 (two) times daily., Disp: , Rfl:    vitamin B-12 (CYANOCOBALAMIN) 1000 MCG tablet, Take 1,000 mcg by mouth daily., Disp: , Rfl:   Allergies  Allergen Reactions   Diclofenac-Misoprostol Other (See Comments)    "Heart racing"   Fenofibric Acid    Lipitor [Atorvastatin]    Nsaids     I personally reviewed active problem list, medication list, allergies, family history, social history, health maintenance with the patient/caregiver today.   ROS  Constitutional: Negative for fever or weight change.  Respiratory:positive for cough but no  shortness of breath.   Cardiovascular: Negative for chest pain or palpitations.  Gastrointestinal: Negative for abdominal pain, no bowel changes.  Musculoskeletal: Negative for gait problem or joint swelling.  Skin: Negative for rash.  Neurological: Negative for dizziness , positive for headache.  No other specific complaints in a complete review of systems (except as listed in HPI above).   Objective  Vitals:   02/28/23 0742  BP: 132/70  Pulse: 74  Resp: 16  SpO2: 97%  Weight: 176 lb (79.8 kg)  Height: 5\' 8"  (1.727 m)    Body mass index is 26.76 kg/m.  Physical Exam  Constitutional: Patient appears well-developed and well-nourished.  No distress.  HEENT: head atraumatic, normocephalic, pupils equal and reactive to light, neck supple Cardiovascular: Normal rate, regular rhythm and normal heart sounds.  No murmur heard. No BLE edema. Pulmonary/Chest: Effort normal and breath sounds normal. No respiratory distress. Abdominal: Soft.  There is no tenderness. Muscular skeletal: trigger point positive  Psychiatric: Patient has a normal mood and affect. behavior is normal. Judgment and thought content normal.   Recent Results (from the past 2160 hour(s))   GI Profile, Stool, PCR     Status: None   Collection Time: 12/07/22 11:45 AM  Result Value Ref Range   Campylobacter Not Detected Not Detected   C difficile toxin A/B Not Detected Not Detected   Plesiomonas shigelloides  Not Detected Not Detected   Salmonella Not Detected Not Detected   Vibrio Not Detected Not Detected   Vibrio cholerae Not Detected Not Detected   Yersinia enterocolitica Not Detected Not Detected   Enteroaggregative E coli Not Detected Not Detected   Enteropathogenic E coli Not Detected Not Detected   Enterotoxigenic E coli Not Detected Not Detected   Shiga-toxin-producing E coli Not Detected Not Detected   E coli O157 Not applicable Not Detected   Shigella/Enteroinvasive E coli Not Detected Not Detected   Cryptosporidium Not Detected Not Detected   Cyclospora cayetanensis Not Detected Not Detected   Entamoeba histolytica Not Detected Not Detected   Giardia lamblia Not Detected Not Detected   Adenovirus F 40/41 Not Detected Not Detected   Astrovirus Not Detected Not Detected   Norovirus GI/GII Not Detected Not Detected   Rotavirus A Not Detected Not Detected   Sapovirus Not Detected Not Detected  POCT HgB A1C     Status: Abnormal   Collection Time: 02/28/23  7:52 AM  Result Value Ref Range   Hemoglobin A1C 6.2 (A) 4.0 - 5.6 %   HbA1c POC (<> result, manual entry)     HbA1c, POC (prediabetic range)     HbA1c, POC (controlled diabetic range)      PHQ2/9:    02/28/2023    7:49 AM 08/29/2022    8:19 AM 08/21/2022    8:09 AM 03/20/2022    8:03 AM 02/16/2022   12:58 PM  Depression screen PHQ 2/9  Decreased Interest 0 0 0 0 0  Down, Depressed, Hopeless 0 0 0 0 0  PHQ - 2 Score 0 0 0 0 0  Altered sleeping 0 1 3 0 0  Tired, decreased energy 3 3 3  0 0  Change in appetite 0 0 2 0 0  Feeling bad or failure about yourself  0 0 0 0 0  Trouble concentrating 0 0 0 0 0  Moving slowly or fidgety/restless 0 0 1 0 0  Suicidal thoughts 0 0 0 0 0  PHQ-9 Score 3 4 9  0 0   Difficult doing work/chores   Somewhat difficult  Not difficult at all    phq 9 is negative   Fall Risk:    02/28/2023    7:49 AM 08/29/2022    8:19 AM 08/21/2022    8:13 AM 03/20/2022    8:03 AM 02/16/2022   12:58 PM  Fall Risk   Falls in the past year? 0 0 0 0 0  Number falls in past yr: 0 0  0 0  Injury with Fall? 0 0  0 0  Risk for fall due to : No Fall Risks No Fall Risks No Fall Risks No Fall Risks No Fall Risks  Follow up Falls prevention discussed Falls prevention discussed Falls evaluation completed;Education provided;Falls prevention discussed Falls prevention discussed Falls prevention discussed      Functional Status Survey: Is the patient deaf or have difficulty hearing?: No Does the patient have difficulty seeing, even when wearing glasses/contacts?: Yes Does the patient have difficulty concentrating, remembering, or making decisions?: No Does the patient have difficulty walking or climbing stairs?: Yes Does the patient have difficulty dressing or bathing?: No Does the patient have difficulty doing errands alone such as visiting a doctor's office or shopping?: No    Assessment & Plan  1. Type 2 diabetes mellitus with pressure callus (HCC)  - POCT HgB A1C  2. Angina pectoris (HCC)  She will follow up  with Dr. Juliann Pares   3. Atherosclerosis of aorta (HCC)  She is taking statins again   4. MS (multiple sclerosis) (HCC)  Stable   5. Dyslipidemia associated with type 2 diabetes mellitus (HCC)  On diet and statin therapy   Rx Crestor 5 mg sent  6. Fibromyalgia  Not compliant with medication  7. Vitamin D deficiency  Needs to resume supplementation   8. RLS (restless legs syndrome)  Managed by Dr. Sherryll Burger   9. Dyslipidemia   10. B12 deficiency  Offered B12 injections but she would like to hold off  11. Migraine aura without headache   12. Chronic daily headache  Needs to go back to Dr. Sherryll Burger

## 2023-02-28 ENCOUNTER — Ambulatory Visit (INDEPENDENT_AMBULATORY_CARE_PROVIDER_SITE_OTHER): Payer: 59 | Admitting: Family Medicine

## 2023-02-28 ENCOUNTER — Encounter: Payer: Self-pay | Admitting: Family Medicine

## 2023-02-28 VITALS — BP 132/70 | HR 74 | Resp 16 | Ht 68.0 in | Wt 176.0 lb

## 2023-02-28 DIAGNOSIS — G43109 Migraine with aura, not intractable, without status migrainosus: Secondary | ICD-10-CM

## 2023-02-28 DIAGNOSIS — E559 Vitamin D deficiency, unspecified: Secondary | ICD-10-CM

## 2023-02-28 DIAGNOSIS — M797 Fibromyalgia: Secondary | ICD-10-CM | POA: Diagnosis not present

## 2023-02-28 DIAGNOSIS — E11628 Type 2 diabetes mellitus with other skin complications: Secondary | ICD-10-CM | POA: Diagnosis not present

## 2023-02-28 DIAGNOSIS — E538 Deficiency of other specified B group vitamins: Secondary | ICD-10-CM

## 2023-02-28 DIAGNOSIS — L84 Corns and callosities: Secondary | ICD-10-CM

## 2023-02-28 DIAGNOSIS — I209 Angina pectoris, unspecified: Secondary | ICD-10-CM

## 2023-02-28 DIAGNOSIS — G2581 Restless legs syndrome: Secondary | ICD-10-CM | POA: Diagnosis not present

## 2023-02-28 DIAGNOSIS — E1169 Type 2 diabetes mellitus with other specified complication: Secondary | ICD-10-CM

## 2023-02-28 DIAGNOSIS — Z23 Encounter for immunization: Secondary | ICD-10-CM | POA: Diagnosis not present

## 2023-02-28 DIAGNOSIS — G35 Multiple sclerosis: Secondary | ICD-10-CM | POA: Diagnosis not present

## 2023-02-28 DIAGNOSIS — E785 Hyperlipidemia, unspecified: Secondary | ICD-10-CM

## 2023-02-28 DIAGNOSIS — R519 Headache, unspecified: Secondary | ICD-10-CM

## 2023-02-28 DIAGNOSIS — I700 Atherosclerosis of aorta: Secondary | ICD-10-CM | POA: Diagnosis not present

## 2023-02-28 LAB — POCT GLYCOSYLATED HEMOGLOBIN (HGB A1C): Hemoglobin A1C: 6.2 % — AB (ref 4.0–5.6)

## 2023-02-28 MED ORDER — ROSUVASTATIN CALCIUM 5 MG PO TABS: 5.0000 mg | ORAL_TABLET | Freq: Every day | ORAL | 1 refills | Status: DC

## 2023-03-05 DIAGNOSIS — R059 Cough, unspecified: Secondary | ICD-10-CM | POA: Diagnosis not present

## 2023-03-05 DIAGNOSIS — J069 Acute upper respiratory infection, unspecified: Secondary | ICD-10-CM | POA: Diagnosis not present

## 2023-03-05 DIAGNOSIS — J029 Acute pharyngitis, unspecified: Secondary | ICD-10-CM | POA: Diagnosis not present

## 2023-03-06 ENCOUNTER — Encounter: Payer: Self-pay | Admitting: Gastroenterology

## 2023-03-06 ENCOUNTER — Other Ambulatory Visit: Payer: Self-pay

## 2023-03-06 ENCOUNTER — Ambulatory Visit (INDEPENDENT_AMBULATORY_CARE_PROVIDER_SITE_OTHER): Payer: 59 | Admitting: Gastroenterology

## 2023-03-06 VITALS — BP 127/76 | HR 76 | Temp 98.2°F | Ht 68.0 in | Wt 173.5 lb

## 2023-03-06 DIAGNOSIS — Z1211 Encounter for screening for malignant neoplasm of colon: Secondary | ICD-10-CM

## 2023-03-06 DIAGNOSIS — K8681 Exocrine pancreatic insufficiency: Secondary | ICD-10-CM | POA: Diagnosis not present

## 2023-03-06 MED ORDER — NA SULFATE-K SULFATE-MG SULF 17.5-3.13-1.6 GM/177ML PO SOLN: 354.0000 mL | Freq: Once | ORAL | 0 refills | Status: AC

## 2023-03-06 NOTE — Progress Notes (Addendum)
Cephas Darby, MD [address]  [place]  [place], [place] [address]  Main: [phone]  Fax: [phone]    Gastroenterology Consultation  Referring Provider:     Steele Sizer, MD Primary Care Physician:  Steele Sizer, MD Primary Gastroenterologist:  Dr. Cephas Darby Reason for Consultation: Lower abdominal discomfort, abdominal bloating, loose stools and dyspepsia        HPI:   Ashlee Richardson is a 67 y.o. female referred by Dr. Steele Sizer, MD  for consultation & management of chronic lower abdominal pain and bloating.  Patient has been originally evaluated by me in 2021 and then lost to follow-up.  She reports that she has been dealing with symptoms of chronic lower abdominal pain associated with feeling gassy, loose mushy bowel movements but no diarrhea.  She was having diarrhea when she was on antibiotics for upper respiratory infection.  She reports that she is no longer experiencing constipation.  She also reports upper abdominal discomfort with regurgitation and burning in her throat.  She states she is tired of taking medications and wants further workup.  She has lost about 3 to 4 pounds within the last 6 months.  She reports having good appetite.  She denies consumption of carbonated beverages, sweet tea.  She reports drinking about a gallon of water daily.  She does not smoke or drink alcohol.  Her workup in the past included CT abdomen pelvis with contrast which revealed sigmoid diverticulosis only. GI pathogen panel including C. difficile was negative in July 2023  Follow-up visit 03/06/2023 Patient is here for follow-up of lower abdominal discomfort with abdominal bloating, loose stools.  She reports her symptoms have resolved.  She underwent stool studies, negative for infection.  Her pancreatic fecal elastase levels were 114.  Started her on Creon 36 K 1 capsule with each meal and 1 with snack.  Patient reports she has been taking 3 times daily.  Her  bloating and cramps have resolved.  However, she has been experiencing irregular bowel movements, about once a week.  She has to take MiraLAX in order to have a bowel movement.  She states that she has been eating salads twice daily with ranch dressing.  Her weight is stable.  She does not have any other concerns today.   NSAIDs: Mobic for chronic back pain  Antiplts/Anticoagulants/Anti thrombotics: none  GI Procedures: colonoscopy 2018 by Dr Jamal Collin Diverticulosis, otherwise normal colon Colonoscopy in 09/2007  EGD 11/24/2018 - Normal duodenal bulb, second portion of the duodenum and third portion of the duodenum. - 1 cm hiatal hernia. - Normal stomach. Biopsied. - Esophagogastric landmarks identified. - Normal gastroesophageal junction and esophagus. Biopsied.  DIAGNOSIS:  A.  STOMACH, RANDOM; COLD BIOPSY:  - FOCAL MILD NON-SPECIFIC CHRONIC GASTRITIS.  - NEGATIVE FOR H. PYLORI, DYSPLASIA, AND MALIGNANCY.   B.  ESOPHAGUS; COLD BIOPSY:  - UNREMARKALBE SQUAMOUS MUCOSA.  - NEGATIVE FOR EOSINOPHILS, DYSPLASIA, AND MALIGNANCY.  - FRAGMENT OF UNREMARKABLE GASTRIC MUCOSA.    Past Medical History:  Diagnosis Date   B12 deficiency 12/27/2017   Diverticulitis    Fibromyalgia    GERD (gastroesophageal reflux disease)    High cholesterol    Migraine    MS (multiple sclerosis) (HCC)    PUD (peptic ulcer disease) 04/28/2018    Past Surgical History:  Procedure Laterality Date   BACK SURGERY  1991?   Pt usure of date   BREAST BIOPSY Right    COLONOSCOPY  09/2007  COLONOSCOPY WITH PROPOFOL N/A 08/28/2017   Procedure: COLONOSCOPY WITH PROPOFOL;  Surgeon: Christene Lye, MD;  Location: [facility] ENDOSCOPY;  Service: Endoscopy;  Laterality: N/A;   ESOPHAGOGASTRODUODENOSCOPY (EGD) WITH PROPOFOL N/A 11/24/2018   Procedure: ESOPHAGOGASTRODUODENOSCOPY (EGD) WITH PROPOFOL;  Surgeon: Lin Landsman, MD;  Location: [facility] ENDOSCOPY;  Service: Gastroenterology;  Laterality: N/A;   Foot  sugery Right    3rd toe from the right"    Current Outpatient Medications:    Calcium-Magnesium-Vitamin D 300-150-400 MG-MG-UNIT TABS, Take by mouth., Disp: , Rfl:    cholecalciferol (VITAMIN D3) 25 MCG (1000 UT) tablet, Take 1,000 Units by mouth daily., Disp: , Rfl:    DULoxetine (CYMBALTA) 60 MG capsule, Take 60 mg by mouth daily., Disp: , Rfl:    ezetimibe (ZETIA) 10 MG tablet, TAKE 1 TABLET BY MOUTH EVERY DAY, Disp: 90 tablet, Rfl: 0   fluticasone-salmeterol (ADVAIR) 100-50 MCG/ACT AEPB, Inhale 1 puff into the lungs 2 (two) times daily., Disp: 60 each, Rfl: 5   lipase/protease/amylase (CREON) 36000 UNITS CPEP capsule, Take 1 tablet with the first bite of each and meal and 1 capsule with the first bite of each snack., Disp: 180 capsule, Rfl: 2   meclizine (ANTIVERT) 25 MG tablet, Take 25 mg by mouth 3 (three) times daily as needed for dizziness., Disp: , Rfl:    meloxicam (MOBIC) 15 MG tablet, TAKE 1 TABLET BY MOUTH EVERY DAY, Disp: 90 tablet, Rfl: 0   Na Sulfate-K Sulfate-Mg Sulf 17.5-3.13-1.6 GM/177ML SOLN, Take 354 mLs by mouth once for 1 dose., Disp: 354 mL, Rfl: 0   omeprazole (PRILOSEC) 20 MG capsule, Take 20 mg by mouth daily., Disp: , Rfl:    pregabalin (LYRICA) 50 MG capsule, Take 1-2 capsules (50-100 mg total) by mouth at bedtime., Disp: 60 capsule, Rfl: 2   QULIPTA 60 MG TABS, Take 1 tablet by mouth daily., Disp: , Rfl:    rosuvastatin (CRESTOR) 5 MG tablet, Take 1 tablet (5 mg total) by mouth daily., Disp: 90 tablet, Rfl: 1   SUMAtriptan (IMITREX) 100 MG tablet, Take as directed PRN migraines, Disp: , Rfl:    tiZANidine (ZANAFLEX) 2 MG tablet, TAKE 1 TABLET (2 MG TOTAL) BY MOUTH 3 (THREE) TIMES DAILY., Disp: , Rfl:    valACYclovir (VALTREX) 500 MG tablet, Take 1 tablet by mouth 2 (two) times daily., Disp: , Rfl:    vitamin B-12 (CYANOCOBALAMIN) 1000 MCG tablet, Take 1,000 mcg by mouth daily., Disp: , Rfl:    albuterol (VENTOLIN HFA) 108 (90 Base) MCG/ACT inhaler, Inhale into the  lungs every 6 (six) hours as needed for wheezing or shortness of breath., Disp: , Rfl:    benzonatate (TESSALON) 200 MG capsule, Take 200 mg by mouth 3 (three) times daily as needed for cough., Disp: , Rfl:    promethazine-dextromethorphan (PROMETHAZINE-DM) 6.25-15 MG/5ML syrup, Take by mouth 4 (four) times daily as needed for cough., Disp: , Rfl:    Family History  Problem Relation Age of Onset   Heart disease Mother    Diabetes Mother    Kidney disease Mother    Hypertension Mother    Heart disease Father    Hyperlipidemia Father    Hypertension Father    Heart disease Other    Cancer Other        Breast   Diabetes Other    Hyperlipidemia Sister    Hypertension Sister    Kidney disease Sister    Diabetes Sister    Lung cancer Paternal Aunt  Cancer Maternal Grandmother        breast     Social History   Tobacco Use   Smoking status: Never   Smokeless tobacco: Never   Tobacco comments:    smoking cessation materials not required  Vaping Use   Vaping Use: Never used  Substance Use Topics   Alcohol use: No    Alcohol/week: 0.0 standard drinks of alcohol   Drug use: No    Allergies as of 03/06/2023 - Review Complete 03/06/2023  Allergen Reaction Noted   Diclofenac-misoprostol Other (See Comments) 08/15/2021   Fenofibric acid  04/20/2015   Lipitor [atorvastatin]  04/20/2015   Nsaids Hives 04/20/2015    Review of Systems:    All systems reviewed and negative except where noted in HPI.   Physical Exam:  BP 127/76 (BP Location: Left Arm, Patient Position: Sitting, Cuff Size: Normal)   Pulse 76   Temp 98.2 F (36.8 C) (Oral)   Ht '5\' 8"'$  (1.727 m)   Wt 173 lb 8 oz (78.7 kg)   BMI 26.38 kg/m  No LMP recorded. Patient is postmenopausal.  General:   Alert,  Well-developed, well-nourished, pleasant and cooperative in NAD, patient is uncomfortable sitting due to low back pain Head:  Normocephalic and atraumatic. Eyes:  Sclera clear, no icterus.   Conjunctiva  pink. Ears:  Normal auditory acuity. Nose:  No deformity, discharge, or lesions. Mouth:  No deformity or lesions,oropharynx pink & moist. Neck:  Supple; no masses or thyromegaly. Lungs:  Respirations even and unlabored.  Clear throughout to auscultation.   No wheezes, crackles, or rhonchi. No acute distress. Heart:  Regular rate and rhythm; no murmurs, clicks, rubs, or gallops. Abdomen:  Normal bowel sounds. Soft, nondistended, nontender, without masses, hepatosplenomegaly or hernias noted.  No guarding or rebound tenderness.   Rectal: Solid stool in the rectal vault Msk:  Symmetrical without gross deformities. Good, equal movement & strength bilaterally. Pulses:  Normal pulses noted. Extremities:  No clubbing or edema.  No cyanosis. Neurologic:  Alert and oriented x3;  grossly normal neurologically. Skin:  Intact without significant lesions or rashes. No jaundice. Psych:  Alert and cooperative. Normal mood and affect.  Imaging Studies: Reviewed  Assessment and Plan:   BERLINE EASTERDAY is a 67 y.o. African-American female with history of chronic lower abdominal discomfort associated with flatulence, loose mushy stools, symptoms of dyspepsia and heartburn.  H. pylori breath test negative, GI profile PCR negative.  Pancreatic fecal elastase levels were 114  Exocrine pancreatic insufficiency based on low pancreatic fecal elastase levels Continue Creon 36 K 1 to 3 capsules daily Given the fact that patient is suffering from constipation since initiation of Creon, advised her to take a drug holiday until she starts moving her bowels and then slowly reintroduce Creon 1 capsule a day and gradually increase Patient is agreeable with the plan CT abdomen pelvis with contrast in 03/2021 revealed normal-appearing pancreas  Colon cancer screening Recommend screening colonoscopy with 2-day prep   Follow up as needed  Cephas Darby, MD

## 2023-03-06 NOTE — Addendum Note (Signed)
Addended by: Ulyess Blossom L on: 03/06/2023 02:29 PM   Modules accepted: Orders

## 2023-03-13 NOTE — Progress Notes (Deleted)
Name: Ashlee Richardson   MRN: [id number]    DOB: [birth year]   Date:03/13/2023       Progress Note  Subjective  Chief Complaint  Cough/ Sore Throat  HPI  *** Patient Active Problem List   Diagnosis Date Noted   Type 2 diabetes mellitus with pressure callus ([place]) 03/20/2021   Diabetes mellitus type 2, diet-controlled ([place]) 03/20/2021   Onychomycosis of multiple toenails with type 2 diabetes mellitus ([place]) 03/20/2021   Chronic idiopathic constipation 03/20/2021   Lumbar foraminal stenosis 03/08/2021   Myalgia due to statin 05/26/2020   History of lumbar fusion 11/22/2019   GERD (gastroesophageal reflux disease) 04/28/2018   Pure hypercholesterolemia 04/28/2018   Diverticulosis 04/28/2018   RLS (restless legs syndrome) 04/28/2018   Angina pectoris ([place]) 04/28/2018   Vitamin D deficiency 12/27/2017   Chronic fatigue, unspecified 12/27/2017   Intractable migraine without aura and without status migrainosus 10/17/2015   DDD (degenerative disc disease), cervical 05/23/2015   Bilateral occipital neuralgia 05/23/2015   DDD (degenerative disc disease), lumbar 05/23/2015   Multiple sclerosis ([place]) 05/23/2015   Cervical disc disorder with radiculopathy of cervical region 04/20/2015   Right arm numbness 04/20/2015    Past Surgical History:  Procedure Laterality Date   BACK SURGERY  1991?   Pt usure of date   BREAST BIOPSY Right    COLONOSCOPY  09/2007   COLONOSCOPY WITH PROPOFOL N/A 08/28/2017   Procedure: COLONOSCOPY WITH PROPOFOL;  Surgeon: Christene Lye, MD;  Location: [facility] ENDOSCOPY;  Service: Endoscopy;  Laterality: N/A;   ESOPHAGOGASTRODUODENOSCOPY (EGD) WITH PROPOFOL N/A 11/24/2018   Procedure: ESOPHAGOGASTRODUODENOSCOPY (EGD) WITH PROPOFOL;  Surgeon: Lin Landsman, MD;  Location: [facility] ENDOSCOPY;  Service: Gastroenterology;  Laterality: N/A;   Foot sugery Right    3rd toe from the right"    Family History  Problem Relation Age of Onset   Heart disease Mother     Diabetes Mother    Kidney disease Mother    Hypertension Mother    Heart disease Father    Hyperlipidemia Father    Hypertension Father    Heart disease Other    Cancer Other        Breast   Diabetes Other    Hyperlipidemia Sister    Hypertension Sister    Kidney disease Sister    Diabetes Sister    Lung cancer Paternal Aunt    Cancer Maternal Grandmother        breast    Social History   Tobacco Use   Smoking status: Never   Smokeless tobacco: Never   Tobacco comments:    smoking cessation materials not required  Substance Use Topics   Alcohol use: No    Alcohol/week: 0.0 standard drinks of alcohol     Current Outpatient Medications:    albuterol (VENTOLIN HFA) 108 (90 Base) MCG/ACT inhaler, Inhale into the lungs every 6 (six) hours as needed for wheezing or shortness of breath., Disp: , Rfl:    benzonatate (TESSALON) 200 MG capsule, Take 200 mg by mouth 3 (three) times daily as needed for cough., Disp: , Rfl:    Calcium-Magnesium-Vitamin D 300-150-400 MG-MG-UNIT TABS, Take by mouth., Disp: , Rfl:    cholecalciferol (VITAMIN D3) 25 MCG (1000 UT) tablet, Take 1,000 Units by mouth daily., Disp: , Rfl:    DULoxetine (CYMBALTA) 60 MG capsule, Take 60 mg by mouth daily., Disp: , Rfl:    ezetimibe (ZETIA) 10 MG tablet, TAKE 1 TABLET BY MOUTH EVERY DAY, Disp: 90  tablet, Rfl: 0   fluticasone-salmeterol (ADVAIR) 100-50 MCG/ACT AEPB, Inhale 1 puff into the lungs 2 (two) times daily., Disp: 60 each, Rfl: 5   lipase/protease/amylase (CREON) 36000 UNITS CPEP capsule, Take 1 tablet with the first bite of each and meal and 1 capsule with the first bite of each snack., Disp: 180 capsule, Rfl: 2   meclizine (ANTIVERT) 25 MG tablet, Take 25 mg by mouth 3 (three) times daily as needed for dizziness., Disp: , Rfl:    meloxicam (MOBIC) 15 MG tablet, TAKE 1 TABLET BY MOUTH EVERY DAY, Disp: 90 tablet, Rfl: 0   omeprazole (PRILOSEC) 20 MG capsule, Take 20 mg by mouth daily., Disp: , Rfl:     pregabalin (LYRICA) 50 MG capsule, Take 1-2 capsules (50-100 mg total) by mouth at bedtime., Disp: 60 capsule, Rfl: 2   promethazine-dextromethorphan (PROMETHAZINE-DM) 6.25-15 MG/5ML syrup, Take by mouth 4 (four) times daily as needed for cough., Disp: , Rfl:    QULIPTA 60 MG TABS, Take 1 tablet by mouth daily., Disp: , Rfl:    rosuvastatin (CRESTOR) 5 MG tablet, Take 1 tablet (5 mg total) by mouth daily., Disp: 90 tablet, Rfl: 1   SUMAtriptan (IMITREX) 100 MG tablet, Take as directed PRN migraines, Disp: , Rfl:    tiZANidine (ZANAFLEX) 2 MG tablet, TAKE 1 TABLET (2 MG TOTAL) BY MOUTH 3 (THREE) TIMES DAILY., Disp: , Rfl:    valACYclovir (VALTREX) 500 MG tablet, Take 1 tablet by mouth 2 (two) times daily., Disp: , Rfl:    vitamin B-12 (CYANOCOBALAMIN) 1000 MCG tablet, Take 1,000 mcg by mouth daily., Disp: , Rfl:   Allergies  Allergen Reactions   Diclofenac-Misoprostol Other (See Comments)    "Heart racing"   Fenofibric Acid     Pt doesn't remember reaction   Lipitor [Atorvastatin]     Chest pain   Nsaids Hives    I personally reviewed active problem list, medication list, allergies, family history, social history, health maintenance with the patient/caregiver today.   ROS  ***  Objective  There were no vitals filed for this visit.  There is no height or weight on file to calculate BMI.  Physical Exam ***  Recent Results (from the past 2160 hour(s))  POCT HgB A1C     Status: Abnormal   Collection Time: 02/28/23  7:52 AM  Result Value Ref Range   Hemoglobin A1C 6.2 (A) 4.0 - 5.6 %   HbA1c POC (<> result, manual entry)     HbA1c, POC (prediabetic range)     HbA1c, POC (controlled diabetic range)      PHQ2/9:    02/28/2023    7:49 AM 08/29/2022    8:19 AM 08/21/2022    8:09 AM 03/20/2022    8:03 AM 02/16/2022   12:58 PM  Depression screen PHQ 2/9  Decreased Interest 0 0 0 0 0  Down, Depressed, Hopeless 0 0 0 0 0  PHQ - 2 Score 0 0 0 0 0  Altered sleeping 0 1 3 0 0   Tired, decreased energy 3 3 3  0 0  Change in appetite 0 0 2 0 0  Feeling bad or failure about yourself  0 0 0 0 0  Trouble concentrating 0 0 0 0 0  Moving slowly or fidgety/restless 0 0 1 0 0  Suicidal thoughts 0 0 0 0 0  PHQ-9 Score 3 4 9  0 0  Difficult doing work/chores   Somewhat difficult  Not difficult at all  phq 9 is {gen pos [id number]   Fall Risk:    02/28/2023    7:49 AM 08/29/2022    8:19 AM 08/21/2022    8:13 AM 03/20/2022    8:03 AM 02/16/2022   12:58 PM  Fall Risk   Falls in the past year? 0 0 0 0 0  Number falls in past yr: 0 0  0 0  Injury with Fall? 0 0  0 0  Risk for fall due to : No Fall Risks No Fall Risks No Fall Risks No Fall Risks No Fall Risks  Follow up Falls prevention discussed Falls prevention discussed Falls evaluation completed;Education provided;Falls prevention discussed Falls prevention discussed Falls prevention discussed      Functional Status Survey:      Assessment & Plan  *** There are no diagnoses linked to this encounter.

## 2023-03-14 ENCOUNTER — Ambulatory Visit: Payer: 59 | Admitting: Family Medicine

## 2023-04-02 ENCOUNTER — Ambulatory Visit
Admission: RE | Disposition: A | Discharge status: Home / Self Care | Payer: Self-pay | Source: Home / Self Care | Attending: Gastroenterology

## 2023-04-02 ENCOUNTER — Ambulatory Visit: Payer: 59 | Admitting: Anesthesiology

## 2023-04-02 ENCOUNTER — Telehealth: Payer: Self-pay | Admitting: Family Medicine

## 2023-04-02 ENCOUNTER — Other Ambulatory Visit: Payer: Self-pay

## 2023-04-02 ENCOUNTER — Ambulatory Visit
Admission: RE | Admit: 2023-04-02 | Discharge: 2023-04-02 | Disposition: A | Discharge status: Home / Self Care | Payer: 59 | Attending: Gastroenterology | Admitting: Gastroenterology

## 2023-04-02 ENCOUNTER — Encounter: Payer: Self-pay | Admitting: Gastroenterology

## 2023-04-02 DIAGNOSIS — Z1211 Encounter for screening for malignant neoplasm of colon: Secondary | ICD-10-CM | POA: Insufficient documentation

## 2023-04-02 DIAGNOSIS — K5730 Diverticulosis of large intestine without perforation or abscess without bleeding: Secondary | ICD-10-CM | POA: Diagnosis not present

## 2023-04-02 DIAGNOSIS — Z8711 Personal history of peptic ulcer disease: Secondary | ICD-10-CM | POA: Diagnosis not present

## 2023-04-02 DIAGNOSIS — Z79899 Other long term (current) drug therapy: Secondary | ICD-10-CM | POA: Diagnosis not present

## 2023-04-02 DIAGNOSIS — G35 Multiple sclerosis: Secondary | ICD-10-CM | POA: Diagnosis not present

## 2023-04-02 DIAGNOSIS — K644 Residual hemorrhoidal skin tags: Secondary | ICD-10-CM | POA: Insufficient documentation

## 2023-04-02 DIAGNOSIS — E7800 Pure hypercholesterolemia, unspecified: Secondary | ICD-10-CM | POA: Diagnosis not present

## 2023-04-02 DIAGNOSIS — M797 Fibromyalgia: Secondary | ICD-10-CM | POA: Insufficient documentation

## 2023-04-02 DIAGNOSIS — K219 Gastro-esophageal reflux disease without esophagitis: Secondary | ICD-10-CM | POA: Diagnosis not present

## 2023-04-02 HISTORY — PX: COLONOSCOPY WITH PROPOFOL: SHX5780

## 2023-04-02 SURGERY — COLONOSCOPY WITH PROPOFOL
Anesthesia: General

## 2023-04-02 MED ORDER — STERILE WATER FOR IRRIGATION IR SOLN
Status: DC | PRN
  Administered 2023-04-02: 1000 mL

## 2023-04-02 MED ORDER — LACTATED RINGERS IV SOLN: INTRAVENOUS | Status: DC

## 2023-04-02 MED ORDER — SODIUM CHLORIDE 0.9 % IV SOLN: INTRAVENOUS | Status: DC

## 2023-04-02 MED ORDER — PROPOFOL 10 MG/ML IV BOLUS
INTRAVENOUS | Status: DC | PRN
  Administered 2023-04-02 (×3): 30 mg via INTRAVENOUS
  Administered 2023-04-02: 40 mg via INTRAVENOUS

## 2023-04-02 MED ORDER — LIDOCAINE HCL (CARDIAC) PF 100 MG/5ML IV SOSY
PREFILLED_SYRINGE | INTRAVENOUS | Status: DC | PRN
  Administered 2023-04-02: 30 mg via INTRATRACHEAL

## 2023-04-02 SURGICAL SUPPLY — 25 items

## 2023-04-02 NOTE — Anesthesia Postprocedure Evaluation (Signed)
Anesthesia Post Note  Patient: Ashlee Richardson  Procedure(s) Performed: COLONOSCOPY WITH PROPOFOL  Patient location during evaluation: PACU Anesthesia Type: General Level of consciousness: awake and alert Pain management: pain level controlled Vital Signs Assessment: post-procedure vital signs reviewed and stable Respiratory status: spontaneous breathing, nonlabored ventilation, respiratory function stable and patient connected to nasal cannula oxygen Cardiovascular status: blood pressure returned to baseline and stable Postop Assessment: no apparent nausea or vomiting Anesthetic complications: no   No notable events documented.   Last Vitals:  Vitals:   04/02/23 1034 04/02/23 1046  BP: 113/62 137/73  Pulse: 65 61  Resp: 18 10  Temp: 36.5 C   SpO2: 99% 100%    Last Pain:  Vitals:   04/02/23 1034  TempSrc:   PainSc: Asleep                 Monetta Lick C Osiris Charles

## 2023-04-02 NOTE — Transfer of Care (Signed)
Immediate Anesthesia Transfer of Care Note  Patient: Ashlee Richardson  Procedure(s) Performed: COLONOSCOPY WITH PROPOFOL  Patient Location: PACU  Anesthesia Type: General  Level of Consciousness: awake, alert  and patient cooperative  Airway and Oxygen Therapy: Patient Spontanous Breathing and Patient connected to supplemental oxygen  Post-op Assessment: Post-op Vital signs reviewed, Patient's Cardiovascular Status Stable, Respiratory Function Stable, Patent Airway and No signs of Nausea or vomiting  Post-op Vital Signs: Reviewed and stable  Complications: No notable events documented.

## 2023-04-02 NOTE — Anesthesia Preprocedure Evaluation (Signed)
Anesthesia Evaluation  Patient identified by MRN, date of birth, ID band Patient awake    Reviewed: Allergy & Precautions, H&P , NPO status , Patient's Chart, lab work & pertinent test results  Airway Mallampati: I  TM Distance: >3 FB Neck ROM: Full    Dental no notable dental hx.    Pulmonary neg pulmonary ROS   Pulmonary exam normal breath sounds clear to auscultation       Cardiovascular Normal cardiovascular exam Rhythm:Regular Rate:Normal     Neuro/Psych negative neurological ROS  negative psych ROS   GI/Hepatic Neg liver ROS, PUD,GERD  Poorly Controlled and Medicated,,  Endo/Other  negative endocrine ROS  Pancreatic insufficiency hx Patient denies diabetes mellitus  Renal/GU negative Renal ROS  negative genitourinary   Musculoskeletal negative musculoskeletal ROS (+)    Abdominal   Peds negative pediatric ROS (+)  Hematology negative hematology ROS (+)   Anesthesia Other Findings   Reproductive/Obstetrics negative OB ROS                             Anesthesia Physical Anesthesia Plan  ASA: 2  Anesthesia Plan: General   Post-op Pain Management:    Induction: Intravenous  PONV Risk Score and Plan:   Airway Management Planned: Natural Airway and Nasal Cannula  Additional Equipment:   Intra-op Plan:   Post-operative Plan:   Informed Consent: I have reviewed the patients History and Physical, chart, labs and discussed the procedure including the risks, benefits and alternatives for the proposed anesthesia with the patient or authorized representative who has indicated his/her understanding and acceptance.     Dental Advisory Given  Plan Discussed with: Anesthesiologist, CRNA and Surgeon  Anesthesia Plan Comments: (Patient consented for risks of anesthesia including but not limited to:  - adverse reactions to medications - risk of airway placement if required -  damage to eyes, teeth, lips or other oral mucosa - nerve damage due to positioning  - sore throat or hoarseness - Damage to heart, brain, nerves, lungs, other parts of body or loss of life  Patient voiced understanding.)       Anesthesia Quick Evaluation

## 2023-04-02 NOTE — Op Note (Signed)
[facility] Gastroenterology Patient Name: Ashlee Richardson Procedure Date: 04/02/2023 10:07 AM MRN: [id number] Account #: [id number] Date of Birth: 1956/05/15 Admit Type: Outpatient Age: 67 Room: [facility] OR ROOM 01 Gender: Female Note Status: Finalized Instrument Name: [id number] Procedure:             Colonoscopy Indications:           Screening for colorectal malignant neoplasm, Last                         colonoscopy: September 2018 Providers:             Toney Reil MD, MD Referring MD:          Onnie Boer. Sowles, MD (Referring MD) Medicines:             General Anesthesia Complications:         No immediate complications. Estimated blood loss: None. Procedure:             Pre-Anesthesia Assessment:                        - Prior to the procedure, a History and Physical was                         performed, and patient medications and allergies were                         reviewed. The patient is competent. The risks and                         benefits of the procedure and the sedation options and                         risks were discussed with the patient. All questions                         were answered and informed consent was obtained.                         Patient identification and proposed procedure were                         verified by the physician, the nurse, the                         anesthesiologist, the anesthetist and the technician                         in the pre-procedure area in the procedure room in the                         endoscopy suite. Mental Status Examination: alert and                         oriented. Airway Examination: normal oropharyngeal                         airway and neck mobility. Respiratory Examination:  clear to auscultation. CV Examination: normal.                         Prophylactic Antibiotics: The patient does not require                         prophylactic antibiotics.  Prior Anticoagulants: The                         patient has taken no anticoagulant or antiplatelet                         agents. ASA Grade Assessment: II - A patient with mild                         systemic disease. After reviewing the risks and                         benefits, the patient was deemed in satisfactory                         condition to undergo the procedure. The anesthesia                         plan was to use general anesthesia. Immediately prior                         to administration of medications, the patient was                         re-assessed for adequacy to receive sedatives. The                         heart rate, respiratory rate, oxygen saturations,                         blood pressure, adequacy of pulmonary ventilation, and                         response to care were monitored throughout the                         procedure. The physical status of the patient was                         re-assessed after the procedure.                        After obtaining informed consent, the colonoscope was                         passed under direct vision. Throughout the procedure,                         the patient's blood pressure, pulse, and oxygen                         saturations were monitored continuously. The  Colonoscope was introduced through the anus and                         advanced to the the cecum, identified by appendiceal                         orifice and ileocecal valve. The colonoscopy was                         performed without difficulty. The patient tolerated                         the procedure well. The quality of the bowel                         preparation was evaluated using the BBPS [facility] Bowel                         Preparation Scale) with scores of: Right Colon = 3,                         Transverse Colon = 3 and Left Colon = 3 (entire mucosa                         seen well with no  residual staining, small fragments                         of stool or opaque liquid). The total BBPS score                         equals 9. The ileocecal valve, appendiceal orifice,                         and rectum were photographed. Findings:      The perianal and digital rectal examinations were normal. Pertinent       negatives include normal sphincter tone and no palpable rectal lesions.      Non-bleeding external hemorrhoids were found during retroflexion. The       hemorrhoids were medium-sized.      Many diverticula were found in the entire colon. Impression:            - Non-bleeding external hemorrhoids.                        - Diverticulosis in the entire examined colon.                        - No specimens collected. Recommendation:        - Discharge patient to home (with escort).                        - Resume previous diet today.                        - Continue present medications.                        - Repeat colonoscopy in 10 years for screening  purposes. Procedure Code(s):     --- Professional ---                        [id number], Colorectal cancer screening; colonoscopy on                         individual not meeting criteria for high risk Diagnosis Code(s):     --- Professional ---                        Z12.11, Encounter for screening for malignant neoplasm                         of colon                        K64.4, Residual hemorrhoidal skin tags                        K57.30, Diverticulosis of large intestine without                         perforation or abscess without bleeding CPT copyright 2022 [employer]. All rights reserved. The codes documented in this report are preliminary and upon coder review may  be revised to meet current compliance requirements. Dr. Libby Mawonini Tanaysha Alkins Toney Reilohini Reddy Savien Mamula MD, MD 04/02/2023 10:31:55 AM This report has been signed electronically. Number of Addenda: 0 Note Initiated On:  04/02/2023 10:07 AM Scope Withdrawal Time: 0 hours 8 minutes 2 seconds  Total Procedure Duration: 0 hours 11 minutes 30 seconds  Estimated Blood Loss:  Estimated blood loss: none.      [facility]

## 2023-04-02 NOTE — H&P (Signed)
Arlyss Repressohini R Manaia Samad, MD [address]  [address]  [place], [place] [address]  Main: [phone]  Fax: [phone] Pager: [phone]  Primary Care Physician:  Alba CorySowles, [name], MD Primary Gastroenterologist:  Dr. Arlyss Repressohini R Melek Pownall  Pre-Procedure History & Physical: HPI:  Ashlee Richardson is a 67 y.o. female is here for an colonoscopy.   Past Medical History:  Diagnosis Date   B12 deficiency 12/27/2017   Diverticulitis    Fibromyalgia    GERD (gastroesophageal reflux disease)    High cholesterol    Migraine    MS (multiple sclerosis)    PUD (peptic ulcer disease) 04/28/2018    Past Surgical History:  Procedure Laterality Date   BACK SURGERY  1991?   Pt usure of date   BREAST BIOPSY Right    COLONOSCOPY  09/2007   COLONOSCOPY WITH PROPOFOL N/A 08/28/2017   Procedure: COLONOSCOPY WITH PROPOFOL;  Surgeon: Kieth BrightlySankar, Seeplaputhur G, MD;  Location: [facility] ENDOSCOPY;  Service: Endoscopy;  Laterality: N/A;   ESOPHAGOGASTRODUODENOSCOPY (EGD) WITH PROPOFOL N/A 11/24/2018   Procedure: ESOPHAGOGASTRODUODENOSCOPY (EGD) WITH PROPOFOL;  Surgeon: Toney ReilVanga, Kalah Pflum Reddy, MD;  [facility];  Service: Gastroenterology;  Laterality: N/A;   Foot sugery Right    3rd toe from the right"    Prior to Admission medications   Medication Sig Start Date End Date Taking? Authorizing Provider  albuterol (VENTOLIN HFA) 108 (90 Base) MCG/ACT inhaler Inhale into the lungs every 6 (six) hours as needed for wheezing or shortness of breath.   Yes [provider]  benzonatate (TESSALON) 200 MG capsule Take 200 mg by mouth 3 (three) times daily as needed for cough.   Yes [provider]  Calcium-Magnesium-Vitamin D 300-150-400 MG-MG-UNIT TABS Take by mouth.   Yes [provider]  cholecalciferol (VITAMIN D3) 25 MCG (1000 UT) tablet Take 1,000 Units by mouth daily.   Yes [provider]  DULoxetine (CYMBALTA) 60 MG capsule Take 60 mg by mouth daily.   Yes [provider]  ezetimibe (ZETIA) 10 MG tablet TAKE 1 TABLET BY MOUTH EVERY DAY 02/24/23  Yes Sowles, Danna HeftyKrichna, MD  fluticasone-salmeterol (ADVAIR) 100-50 MCG/ACT AEPB Inhale 1 puff into the lungs 2 (two) times daily. 08/29/22  Yes Sowles, Danna HeftyKrichna, MD  lipase/protease/amylase (CREON) 36000 UNITS CPEP capsule Take 1 tablet with the first bite of each and meal and 1 capsule with the first bite of each snack. 12/12/22  Yes Izzy Courville, Loel Dubonnetohini Reddy, MD  meclizine (ANTIVERT) 25 MG tablet Take 25 mg by mouth 3 (three) times daily as needed for dizziness.   Yes [provider]  meloxicam (MOBIC) 15 MG tablet TAKE 1 TABLET BY MOUTH EVERY DAY 03/21/20  Yes Sowles, Danna HeftyKrichna, MD  omeprazole (PRILOSEC) 20 MG capsule Take 20 mg by mouth daily.   Yes [provider]  pregabalin (LYRICA) 50 MG capsule Take 1-2 capsules (50-100 mg total) by mouth at bedtime. 09/21/21  Yes Sowles, Danna HeftyKrichna, MD  promethazine-dextromethorphan (PROMETHAZINE-DM) 6.25-15 MG/5ML syrup Take by mouth 4 (four) times daily as needed for cough.   Yes [provider]  QULIPTA 60 MG TABS Take 1 tablet by mouth daily. 07/17/21  Yes Lonell FaceShah, Hemang K, MD  rosuvastatin (CRESTOR) 5 MG tablet Take 1 tablet (5 mg total) by mouth daily. 02/28/23  Yes Sowles, Danna HeftyKrichna, MD  SUMAtriptan (IMITREX) 100 MG tablet Take as directed PRN migraines 08/04/19  Yes Sherryll BurgerShah, Hemang K, MD  tiZANidine (ZANAFLEX) 2 MG tablet TAKE 1 TABLET (2 MG TOTAL) BY MOUTH 3 (THREE) TIMES  DAILY. 08/13/19  Yes [provider]  valACYclovir (VALTREX) 500 MG tablet Take 1 tablet by mouth 2 (two) times daily. 09/30/22 09/30/23 Yes [provider]  vitamin B-12 (CYANOCOBALAMIN) 1000 MCG tablet Take 1,000 mcg by mouth daily.   Yes [provider]    Allergies as of 03/06/2023 - Review Complete 03/06/2023  Allergen Reaction Noted   Diclofenac-misoprostol Other (See Comments) 08/15/2021   Fenofibric acid  04/20/2015   Lipitor [atorvastatin]  04/20/2015    Nsaids Hives 04/20/2015    Family History  Problem Relation Age of Onset   Heart disease Mother    Diabetes Mother    Kidney disease Mother    Hypertension Mother    Heart disease Father    Hyperlipidemia Father    Hypertension Father    Heart disease Other    Cancer Other        Breast   Diabetes Other    Hyperlipidemia Sister    Hypertension Sister    Kidney disease Sister    Diabetes Sister    Lung cancer Paternal Aunt    Cancer Maternal Grandmother        breast    Social History   Socioeconomic History   Marital status: Single    Spouse name: Not on file   Number of children: 2   Years of education: Not on file   Highest education level: Associate degree: academic program  Occupational History    Employer: DISABLED  Tobacco Use   Smoking status: Never   Smokeless tobacco: Never   Tobacco comments:    smoking cessation materials not required  Vaping Use   Vaping Use: Never used  Substance and Sexual Activity   Alcohol use: No    Alcohol/week: 0.0 standard drinks of alcohol   Drug use: No   Sexual activity: Yes    Birth control/protection: Post-menopausal  Other Topics Concern   Not on file  Social History Narrative   Lives at home by herself.   Disable from chronic back pain since 1993   Diagnosed with MS in the 2000's   Social Determinants of Health   Financial Resource Strain: Low Risk  (08/21/2022)   Overall Financial Resource Strain (CARDIA)    Difficulty of Paying Living Expenses: Not very hard  Food Insecurity: No Food Insecurity (08/21/2022)   Hunger Vital Sign    Worried About Running Out of Food in the Last Year: Never true    Ran Out of Food in the Last Year: Never true  Transportation Needs: No Transportation Needs (08/21/2022)   PRAPARE - Administrator, Civil Service (Medical): No    Lack of Transportation (Non-Medical): No  Physical Activity: Insufficiently Active (08/21/2022)   Exercise Vital Sign    Days of Exercise per  Week: 3 days    Minutes of Exercise per Session: 30 min  Stress: No Stress Concern Present (08/21/2022)   [employer] of Occupational Health - Occupational Stress Questionnaire    Feeling of Stress : Only a little  Social Connections: Unknown (08/21/2022)   Social Connection and Isolation Panel [NHANES]    Frequency of Communication with Friends and Family: More than three times a week    Frequency of Social Gatherings with Friends and Family: Once a week    Attends Religious Services: More than 4 times per year    Active Member of [employer] or Organizations: Yes    Attends Banker Meetings: More than 4 times per year  Marital Status: Patient declined  Intimate Partner Violence: Not At Risk (08/21/2022)   Humiliation, Afraid, Rape, and Kick questionnaire    Fear of Current or Ex-Partner: No    Emotionally Abused: No    Physically Abused: No    Sexually Abused: No    Review of Systems: See HPI, otherwise negative ROS  Physical Exam: BP 139/68   Temp 97.7 F (36.5 C) (Tympanic)   Resp (!) 7   Ht 5' 7.99" (1.727 m)   Wt 75.8 kg   SpO2 100%   BMI 25.40 kg/m  General:   Alert,  pleasant and cooperative in NAD Head:  Normocephalic and atraumatic. Neck:  Supple; no masses or thyromegaly. Lungs:  Clear throughout to auscultation.    Heart:  Regular rate and rhythm. Abdomen:  Soft, nontender and nondistended. Normal bowel sounds, without guarding, and without rebound.   Neurologic:  Alert and  oriented x4;  grossly normal neurologically.  Impression/Plan: Ashlee Richardson is here for an colonoscopy to be performed for colon cancer screening  Risks, benefits, limitations, and alternatives regarding  colonoscopy have been reviewed with the patient.  Questions have been answered.  All parties agreeable.   Lannette Donath, MD  04/02/2023, 9:38 AM

## 2023-04-02 NOTE — Telephone Encounter (Signed)
Copied from CRM [id number]. Topic: General - Other >> Apr 02, 2023 12:02 PM Franchot Heidelberg wrote: Reason for CRM: Pt called reporting that she has been instructed to contact her PCP for a diagnostic mammogram. Please advise   She prefers to go to [facility]

## 2023-04-03 ENCOUNTER — Encounter: Payer: Self-pay | Admitting: Gastroenterology

## 2023-04-03 NOTE — Telephone Encounter (Signed)
Pt called back to report that she experiences pain in her left breast that comes and goes. Pt stated she is not having any pain at the moment but she was told she needed a diagnostic mammogram

## 2023-04-03 NOTE — Telephone Encounter (Signed)
Faxed mammogram order for diagnostic and L breast US due to previous/occasional breast pain.

## 2023-04-03 NOTE — Telephone Encounter (Signed)
Called to inquire what symptoms she is having for dx for diagnostic mammogram. No answer, lvm

## 2023-04-08 NOTE — Telephone Encounter (Signed)
Copied from CRM [id number]. Topic: Referral - Status >> Apr 08, 2023 10:32 AM Dondra Prader A wrote: Reason for CRM: Myriam Jacobson from [facility] states that she is needing a corrected referral sent to them. They received a referral for breast pain of the left breast. Per Myriam Jacobson pt had a mammogram screening 11/2021,  on 05/2022 she had diagnostic of the left breast and was suppose to come back 11/2022 for a bilateral mammogram and the pt did not. Per Myriam Jacobson she need the referral for a bilateral mammogram to be sent over. The only order that was sent was for a left mammogram.  Please advise [phone] opt 1 on the phone tree

## 2023-04-08 NOTE — Telephone Encounter (Signed)
Updated order and faxed to [facility] attn.

## 2023-04-23 DIAGNOSIS — N6325 Unspecified lump in the left breast, overlapping quadrants: Secondary | ICD-10-CM | POA: Diagnosis not present

## 2023-04-23 DIAGNOSIS — R92323 Mammographic fibroglandular density, bilateral breasts: Secondary | ICD-10-CM | POA: Diagnosis not present

## 2023-04-23 DIAGNOSIS — N6321 Unspecified lump in the left breast, upper outer quadrant: Secondary | ICD-10-CM | POA: Diagnosis not present

## 2023-04-23 DIAGNOSIS — N644 Mastodynia: Secondary | ICD-10-CM | POA: Diagnosis not present

## 2023-04-29 ENCOUNTER — Telehealth: Payer: Self-pay | Admitting: Family Medicine

## 2023-04-29 NOTE — Telephone Encounter (Unsigned)
Copied from CRM [id number]. Topic: General - Other >> Apr 29, 2023  9:35 AM [name] F wrote: Reason for CRM: Eileen Stanford with [facility] is calling in because she says the pt is scheduled for a biopsy tomorrow and she faxed over paperwork pertaining to the biopsy on 05/02 as well as this morning. Eileen Stanford is requesting the paperwork be signed and faxed back. Eileen Stanford is asking for someone to giver her a call if the paperwork was or wasn't received.

## 2023-05-08 DIAGNOSIS — R928 Other abnormal and inconclusive findings on diagnostic imaging of breast: Secondary | ICD-10-CM | POA: Diagnosis not present

## 2023-05-08 DIAGNOSIS — N6082 Other benign mammary dysplasias of left breast: Secondary | ICD-10-CM | POA: Diagnosis not present

## 2023-05-08 DIAGNOSIS — N6325 Unspecified lump in the left breast, overlapping quadrants: Secondary | ICD-10-CM | POA: Diagnosis not present

## 2023-05-08 DIAGNOSIS — N6320 Unspecified lump in the left breast, unspecified quadrant: Secondary | ICD-10-CM | POA: Diagnosis not present

## 2023-05-08 LAB — HM MAMMOGRAPHY: HM Mammogram: ABNORMAL — AB (ref 0–4)

## 2023-05-13 ENCOUNTER — Ambulatory Visit: Payer: Self-pay | Admitting: *Deleted

## 2023-05-13 NOTE — Telephone Encounter (Signed)
Summary: normal results   Ashlee Richardson with mammography at [facility]  results  Benign and concordant.  Return for one year mammogram.      Please advise regarding contacting patient for results.

## 2023-05-28 DIAGNOSIS — J320 Chronic maxillary sinusitis: Secondary | ICD-10-CM | POA: Diagnosis not present

## 2023-05-28 DIAGNOSIS — R059 Cough, unspecified: Secondary | ICD-10-CM | POA: Diagnosis not present

## 2023-05-28 DIAGNOSIS — R053 Chronic cough: Secondary | ICD-10-CM | POA: Diagnosis not present

## 2023-06-10 DIAGNOSIS — M3501 Sicca syndrome with keratoconjunctivitis: Secondary | ICD-10-CM | POA: Diagnosis not present

## 2023-07-27 DIAGNOSIS — R051 Acute cough: Secondary | ICD-10-CM | POA: Diagnosis not present

## 2023-07-27 DIAGNOSIS — G8929 Other chronic pain: Secondary | ICD-10-CM | POA: Diagnosis not present

## 2023-07-27 DIAGNOSIS — M545 Low back pain, unspecified: Secondary | ICD-10-CM | POA: Diagnosis not present

## 2023-07-27 DIAGNOSIS — K529 Noninfective gastroenteritis and colitis, unspecified: Secondary | ICD-10-CM | POA: Diagnosis not present

## 2023-07-29 ENCOUNTER — Other Ambulatory Visit: Payer: Self-pay | Admitting: Internal Medicine

## 2023-07-29 DIAGNOSIS — R0789 Other chest pain: Secondary | ICD-10-CM | POA: Diagnosis not present

## 2023-07-29 DIAGNOSIS — R079 Chest pain, unspecified: Secondary | ICD-10-CM

## 2023-07-29 DIAGNOSIS — I700 Atherosclerosis of aorta: Secondary | ICD-10-CM | POA: Diagnosis not present

## 2023-07-29 DIAGNOSIS — E7800 Pure hypercholesterolemia, unspecified: Secondary | ICD-10-CM | POA: Diagnosis not present

## 2023-07-29 DIAGNOSIS — R001 Bradycardia, unspecified: Secondary | ICD-10-CM | POA: Diagnosis not present

## 2023-07-29 DIAGNOSIS — R5382 Chronic fatigue, unspecified: Secondary | ICD-10-CM | POA: Diagnosis not present

## 2023-07-29 DIAGNOSIS — R600 Localized edema: Secondary | ICD-10-CM | POA: Diagnosis not present

## 2023-07-29 DIAGNOSIS — K219 Gastro-esophageal reflux disease without esophagitis: Secondary | ICD-10-CM | POA: Diagnosis not present

## 2023-07-29 DIAGNOSIS — E119 Type 2 diabetes mellitus without complications: Secondary | ICD-10-CM | POA: Diagnosis not present

## 2023-08-06 ENCOUNTER — Encounter (HOSPITAL_COMMUNITY): Payer: Self-pay

## 2023-08-06 ENCOUNTER — Other Ambulatory Visit (HOSPITAL_COMMUNITY): Payer: Self-pay | Admitting: *Deleted

## 2023-08-06 DIAGNOSIS — R072 Precordial pain: Secondary | ICD-10-CM

## 2023-08-06 MED ORDER — METOPROLOL TARTRATE 100 MG PO TABS: 100.0000 mg | ORAL_TABLET | Freq: Once | ORAL | 0 refills | Status: DC

## 2023-08-08 ENCOUNTER — Ambulatory Visit: Admission: RE | Admit: 2023-08-08 | Payer: 59 | Source: Ambulatory Visit

## 2023-08-11 DIAGNOSIS — K529 Noninfective gastroenteritis and colitis, unspecified: Secondary | ICD-10-CM | POA: Diagnosis not present

## 2023-08-11 DIAGNOSIS — R109 Unspecified abdominal pain: Secondary | ICD-10-CM | POA: Diagnosis not present

## 2023-08-14 ENCOUNTER — Telehealth (HOSPITAL_COMMUNITY): Payer: Self-pay | Admitting: *Deleted

## 2023-08-14 NOTE — Telephone Encounter (Signed)
Patient returning call upcoming cardiac imaging study; pt verbalizes understanding of appt date/time, parking situation and where to check in, pre-test NPO status and medications ordered, and verified current allergies; name and call back number provided for further questions should they arise  Larey Brick RN Navigator Cardiac Imaging [facility] [phone] office [phone] cell  Patient to take 100mg  metoprolol tartrate two hours prior to her cardiac CT scan.

## 2023-08-14 NOTE — Telephone Encounter (Signed)
Attempted to call patient regarding upcoming cardiac CT appointment. °Left message on voicemail with name and callback number ° °Merle Prescott RN Navigator Cardiac Imaging °[facility] °[phone] Office °[phone] Cell ° °

## 2023-08-15 ENCOUNTER — Other Ambulatory Visit: Payer: Self-pay | Admitting: Internal Medicine

## 2023-08-15 ENCOUNTER — Ambulatory Visit
Admission: RE | Admit: 2023-08-15 | Discharge: 2023-08-15 | Disposition: A | Discharge status: Home / Self Care | Payer: 59 | Source: Ambulatory Visit | Attending: Internal Medicine | Admitting: Internal Medicine

## 2023-08-15 DIAGNOSIS — R079 Chest pain, unspecified: Secondary | ICD-10-CM | POA: Diagnosis not present

## 2023-08-15 DIAGNOSIS — I700 Atherosclerosis of aorta: Secondary | ICD-10-CM | POA: Insufficient documentation

## 2023-08-15 DIAGNOSIS — R931 Abnormal findings on diagnostic imaging of heart and coronary circulation: Secondary | ICD-10-CM | POA: Insufficient documentation

## 2023-08-15 DIAGNOSIS — I2510 Atherosclerotic heart disease of native coronary artery without angina pectoris: Secondary | ICD-10-CM | POA: Diagnosis not present

## 2023-08-15 LAB — POCT I-STAT CREATININE: Creatinine, Ser: 1.1 mg/dL — ABNORMAL HIGH (ref 0.44–1.00)

## 2023-08-15 MED ORDER — NITROGLYCERIN 0.4 MG SL SUBL
0.8000 mg | SUBLINGUAL_TABLET | Freq: Once | SUBLINGUAL | Status: AC
  Administered 2023-08-15: 0.8 mg via SUBLINGUAL

## 2023-08-15 MED ORDER — SODIUM CHLORIDE 0.9 % IV SOLN: INTRAVENOUS | Status: DC

## 2023-08-15 MED ORDER — IOHEXOL 350 MG/ML SOLN
75.0000 mL | Freq: Once | INTRAVENOUS | Status: AC | PRN
  Administered 2023-08-15: 75 mL via INTRAVENOUS

## 2023-08-15 NOTE — Progress Notes (Signed)
Patient tolerated procedure well. Ambulate w/o difficulty. Denies light headedness or being dizzy. Sitting in chair drinking water provided. Encouraged to drink extra water today and reasoning explained. Verbalized understanding. All questions answered. ABC intact. No further needs. Discharge from procedure area w/o issues.   °

## 2023-08-29 NOTE — Progress Notes (Signed)
Name: Ashlee Richardson   MRN: [id number]    DOB: 05-25-1956   Date:08/30/2023       Progress Note  Subjective  Chief Complaint  Follow Up  HPI  MS: under the care of Dr. Sherryll Burger , had a positive IG for herpes on spinal tap but was given reassurance by Dr. Sampson Goon. She denies weakness, but has intermittent tingling an numbness on both arms. MRI showed white plaques. She has some tremors and sometimes decrease in memory, she takes Duloxetine but only prn , she was given Aricept for her memory but she stopped taking it. She is still not taking medications for MS. Unchanged   Left nipple discharge: she was having left nipple discharge for months. She had a normal mammogram Dec 2022 , and normal diagnostic mammogram in June 2023. She canceled her appointment that was made for Dec but she went back to [facility] in May and had a biopsy that was negative. She states she has another mammogram already scheduled   Chronic back pain and neck pain, also has FMS: she was seeing Dr. Ophelia Charter and states noticing increased in left lower back that radiates to left leg, she did not want to discuss details with me, she will contact Dr. Ophelia Charter.  She takes Lyrica , Meloxicam and Duloxetine but not consistently    GERD and gastritis: seen by Dr. Allegra Lai . She was diagnosed with hiatal hernia, she also has a history of constipation, she tried Linzess but caused diarrhea, she was advised to take Miralax instead. She is now taking Omeprazole and Creon enzymes. She continues to have nausea and only able to eat small portions, she also has some indigestion, we will check lipase and also h. Pylori . Advised to go back to Dr. Allegra Lai to discuss it    Insomnia: she states Trazodone does not seem to work well, but she does not want to change it.  Discussed changing medication to Seroquel , but she is afraid due to being off label indication for sleep and she said she did not want to take anything for bipolar.    RLS: taking Requip from  neurologist, she states she continues to have cramps, she is taking Lyrica prn . Symptoms are stable . We will recheck ferritin   DM: A1C was 6.6 % back in September 2021, stayed at 6.6 % down to  5.8  %  6.2 %  and today is up to 6.4 % . She denies polyphagia, polydipsia but she has urinary frequency. She is back on low dose statin and taking Zetia. She is avoiding  sweets, sodas and cutting down on bread . She has a history of pressure callus and sees podiatrist    Angina Pectoris: seen by Dr. Juliann Pares, she stopped taking Crestor due to cramps - she is now taking half dose of crestor and  Zetia, last LDL at goal.  She also had a normal NM myocardial perfusion stress test on 09/12/21 . She recently saw Dr. Juliann Pares with increase in chest pain and had cardiac calcium score done   Atherosclerosis aorta: she is taking Crestor 5 mg and Zetia 10 mg, last time LDL was up from 70 to 156, but at the time she was not taking crestor , we will recheck level today    Migraine headaches: she sees neurologist , Dr. Sherryll Burger, she  is now on Qulipta but only takes it prn, she states episodes are about once a month and resolves with prn medication  B12/Vitamin D deficiency: she is not taking any supplements at this time   Asthma: she takes Advair, she has intermittent SOB and wheezing but no recent episodes of cough   Patient Active Problem List   Diagnosis Date Noted   Screening for colon cancer 04/02/2023   Type 2 diabetes mellitus with pressure callus (HCC) 03/20/2021   Diabetes mellitus type 2, diet-controlled (HCC) 03/20/2021   Onychomycosis of multiple toenails with type 2 diabetes mellitus (HCC) 03/20/2021   Chronic idiopathic constipation 03/20/2021   Lumbar foraminal stenosis 03/08/2021   Myalgia due to statin 05/26/2020   History of lumbar fusion 11/22/2019   GERD (gastroesophageal reflux disease) 04/28/2018   Pure hypercholesterolemia 04/28/2018   Diverticulosis 04/28/2018   RLS (restless legs  syndrome) 04/28/2018   Angina pectoris (HCC) 04/28/2018   Vitamin D deficiency 12/27/2017   Chronic fatigue, unspecified 12/27/2017   Intractable migraine without aura and without status migrainosus 10/17/2015   DDD (degenerative disc disease), cervical 05/23/2015   Bilateral occipital neuralgia 05/23/2015   DDD (degenerative disc disease), lumbar 05/23/2015   Multiple sclerosis (HCC) 05/23/2015   Cervical disc disorder with radiculopathy of cervical region 04/20/2015   Right arm numbness 04/20/2015    Past Surgical History:  Procedure Laterality Date   BACK SURGERY  1991?   Pt usure of date   BREAST BIOPSY Right    COLONOSCOPY  09/2007   COLONOSCOPY WITH PROPOFOL N/A 08/28/2017   Procedure: COLONOSCOPY WITH PROPOFOL;  Surgeon: Kieth Brightly, MD;  Location: [facility] ENDOSCOPY;  Service: Endoscopy;  Laterality: N/A;   COLONOSCOPY WITH PROPOFOL N/A 04/02/2023   Procedure: COLONOSCOPY WITH PROPOFOL;  Surgeon: Toney Reil, MD;  Location: [facility];  Service: Endoscopy;  Laterality: N/A;   ESOPHAGOGASTRODUODENOSCOPY (EGD) WITH PROPOFOL N/A 11/24/2018   Procedure: ESOPHAGOGASTRODUODENOSCOPY (EGD) WITH PROPOFOL;  Surgeon: Toney Reil, MD;  Location: [facility] ENDOSCOPY;  Service: Gastroenterology;  Laterality: N/A;   Foot sugery Right    3rd toe from the right"    Family History  Problem Relation Age of Onset   Heart disease Mother    Diabetes Mother    Kidney disease Mother    Hypertension Mother    Heart disease Father    Hyperlipidemia Father    Hypertension Father    Heart disease Other    Cancer Other        Breast   Diabetes Other    Hyperlipidemia Sister    Hypertension Sister    Kidney disease Sister    Diabetes Sister    Lung cancer Paternal Aunt    Cancer Maternal Grandmother        breast    Social History   Tobacco Use   Smoking status: Never   Smokeless tobacco: Never   Tobacco comments:    smoking cessation materials not required   Substance Use Topics   Alcohol use: No    Alcohol/week: 0.0 standard drinks of alcohol     Current Outpatient Medications:    albuterol (VENTOLIN HFA) 108 (90 Base) MCG/ACT inhaler, Inhale into the lungs every 6 (six) hours as needed for wheezing or shortness of breath., Disp: , Rfl:    Atogepant (QULIPTA) 30 MG TABS, Take 1 tablet by mouth daily as needed., Disp: , Rfl:    Calcium-Magnesium-Vitamin D 300-150-400 MG-MG-UNIT TABS, Take by mouth., Disp: , Rfl:    cholecalciferol (VITAMIN D3) 25 MCG (1000 UT) tablet, Take 1,000 Units by mouth daily., Disp: , Rfl:    DULoxetine (CYMBALTA) 60  MG capsule, Take 60 mg by mouth daily., Disp: , Rfl:    ezetimibe (ZETIA) 10 MG tablet, TAKE 1 TABLET BY MOUTH EVERY DAY, Disp: 90 tablet, Rfl: 0   fluticasone-salmeterol (ADVAIR) 100-50 MCG/ACT AEPB, Inhale 1 puff into the lungs 2 (two) times daily., Disp: 60 each, Rfl: 5   lipase/protease/amylase (CREON) 36000 UNITS CPEP capsule, Take 1 tablet with the first bite of each and meal and 1 capsule with the first bite of each snack., Disp: 180 capsule, Rfl: 2   meclizine (ANTIVERT) 25 MG tablet, Take 25 mg by mouth 3 (three) times daily as needed for dizziness., Disp: , Rfl:    meloxicam (MOBIC) 15 MG tablet, TAKE 1 TABLET BY MOUTH EVERY DAY, Disp: 90 tablet, Rfl: 0   omeprazole (PRILOSEC) 20 MG capsule, Take 20 mg by mouth daily., Disp: , Rfl:    pregabalin (LYRICA) 50 MG capsule, Take 1-2 capsules (50-100 mg total) by mouth at bedtime., Disp: 60 capsule, Rfl: 2   promethazine-dextromethorphan (PROMETHAZINE-DM) 6.25-15 MG/5ML syrup, Take by mouth 4 (four) times daily as needed for cough., Disp: , Rfl:    QULIPTA 60 MG TABS, Take 1 tablet by mouth daily., Disp: , Rfl:    rosuvastatin (CRESTOR) 5 MG tablet, Take 1 tablet (5 mg total) by mouth daily., Disp: 90 tablet, Rfl: 1   SUMAtriptan (IMITREX) 100 MG tablet, Take as directed PRN migraines, Disp: , Rfl:    tiZANidine (ZANAFLEX) 2 MG tablet, TAKE 1 TABLET (2  MG TOTAL) BY MOUTH 3 (THREE) TIMES DAILY., Disp: , Rfl:    valACYclovir (VALTREX) 500 MG tablet, Take 1 tablet by mouth 2 (two) times daily., Disp: , Rfl:    vitamin B-12 (CYANOCOBALAMIN) 1000 MCG tablet, Take 1,000 mcg by mouth daily., Disp: , Rfl:    metoprolol tartrate (LOPRESSOR) 100 MG tablet, Take 1 tablet (100 mg total) by mouth once for 1 dose. Take 90-120 minutes prior to scan. Hold for SBP less than 110., Disp: 1 tablet, Rfl: 0  Allergies  Allergen Reactions   Diclofenac-Misoprostol Other (See Comments)    "Heart racing"   Fenofibric Acid     Pt doesn't remember reaction   Lipitor [Atorvastatin]     Chest pain   Nsaids Hives    I personally reviewed active problem list, medication list, allergies, family history, social history, health maintenance with the patient/caregiver today.   ROS  Constitutional: Negative for fever or weight change.  Respiratory: Negative for cough and shortness of breath.   Cardiovascular: Negative for chest pain or palpitations.  Gastrointestinal: Negative for abdominal pain, no bowel changes.  Musculoskeletal: Negative for gait problem or joint swelling.  Skin: Negative for rash.  Neurological: Negative for dizziness or headache.  No other specific complaints in a complete review of systems (except as listed in HPI above).   Objective  Vitals:   08/30/23 0820  BP: 122/70  Pulse: 67  Resp: 16  Temp: 97.8 F (36.6 C)  TempSrc: Oral  SpO2: 99%  Weight: 173 lb 12.8 oz (78.8 kg)  Height: 5\' 8"  (1.727 m)    Body mass index is 26.43 kg/m.  Physical Exam  Constitutional: Patient appears well-developed and well-nourished. No distress.  HEENT: head atraumatic, normocephalic, pupils equal and reactive to light, neck supple Cardiovascular: Normal rate, regular rhythm and normal heart sounds.  No murmur heard. No BLE edema. Pulmonary/Chest: Effort normal and breath sounds normal. No respiratory distress. Abdominal: Soft.  There is no  tenderness. Psychiatric: Patient has a normal  mood and affect. behavior is normal. Judgment and thought content normal.   Diabetic Foot Exam - Simple   Simple Foot Form Visual Inspection See comments: Yes Sensation Testing See comments: Yes Pulse Check Posterior Tibialis and Dorsalis pulse intact bilaterally: Yes Comments Callus formation on top of 2nd, 3rd and 4th toes on right and 2nd toe on left No sensation during monofilament test Keep follow up with podiatrist      Recent Results (from the past 2160 hour(s))  I-STAT creatinine     Status: Abnormal   Collection Time: 08/15/23  2:31 PM  Result Value Ref Range   Creatinine, Ser 1.10 (H) 0.44 - 1.00 mg/dL  POCT HgB V7Q     Status: Abnormal   Collection Time: 08/30/23  8:23 AM  Result Value Ref Range   Hemoglobin A1C 6.4 (A) 4.0 - 5.6 %   HbA1c POC (<> result, manual entry)     HbA1c, POC (prediabetic range)     HbA1c, POC (controlled diabetic range)        PHQ2/9:    08/30/2023    8:21 AM 02/28/2023    7:49 AM 08/29/2022    8:19 AM 08/21/2022    8:09 AM 03/20/2022    8:03 AM  Depression screen PHQ 2/9  Decreased Interest 0 0 0 0 0  Down, Depressed, Hopeless 0 0 0 0 0  PHQ - 2 Score 0 0 0 0 0  Altered sleeping 0 0 1 3 0  Tired, decreased energy 1 3 3 3  0  Change in appetite 1 0 0 2 0  Feeling bad or failure about yourself  0 0 0 0 0  Trouble concentrating 0 0 0 0 0  Moving slowly or fidgety/restless 0 0 0 1 0  Suicidal thoughts 0 0 0 0 0  PHQ-9 Score 2 3 4 9  0  Difficult doing work/chores Not difficult at all   Somewhat difficult     phq 9 is negative   Fall Risk:    08/30/2023    8:21 AM 02/28/2023    7:49 AM 08/29/2022    8:19 AM 08/21/2022    8:13 AM 03/20/2022    8:03 AM  Fall Risk   Falls in the past year? 0 0 0 0 0  Number falls in past yr:  0 0  0  Injury with Fall?  0 0  0  Risk for fall due to : No Fall Risks No Fall Risks No Fall Risks No Fall Risks No Fall Risks  Follow up Falls prevention discussed  Falls prevention discussed Falls prevention discussed Falls evaluation completed;Education provided;Falls prevention discussed Falls prevention discussed     Assessment & Plan  1. Type 2 diabetes mellitus with pressure callus (HCC)  - POCT HgB A1C  2. MS (multiple sclerosis) (HCC)  Must follow up with neurologist   3. Atherosclerosis of aorta (HCC)  On statin therapy   4. Angina pectoris (HCC)  Under the care of Dr. Juliann Pares   5. Dyslipidemia associated with type 2 diabetes mellitus (HCC)  - COMPLETE METABOLIC PANEL WITH GFR - Urine Microalbumin w/creat. ratio - HM Diabetes Foot Exam - Lipid panel  6. B12 deficiency  - B12 and Folate Panel - CBC with Differential/Platelet  7. Vitamin D deficiency  - VITAMIN D 25 Hydroxy (Vit-D Deficiency, Fractures)  8. RLS (restless legs syndrome)  - Ferritin  9. Long-term use of high-risk medication  - COMPLETE METABOLIC PANEL WITH GFR - CBC with Differential/Platelet  10. Sciatica, left side   She will contact Dr. Ophelia Charter  11. Nausea  - Lipase  12. Dyspepsia  - H. pylori breath test

## 2023-08-30 ENCOUNTER — Encounter: Payer: Self-pay | Admitting: Family Medicine

## 2023-08-30 ENCOUNTER — Ambulatory Visit (INDEPENDENT_AMBULATORY_CARE_PROVIDER_SITE_OTHER): Payer: 59 | Admitting: Family Medicine

## 2023-08-30 VITALS — BP 122/70 | HR 67 | Temp 97.8°F | Resp 16 | Ht 68.0 in | Wt 173.8 lb

## 2023-08-30 DIAGNOSIS — E785 Hyperlipidemia, unspecified: Secondary | ICD-10-CM | POA: Diagnosis not present

## 2023-08-30 DIAGNOSIS — G35 Multiple sclerosis: Secondary | ICD-10-CM

## 2023-08-30 DIAGNOSIS — I700 Atherosclerosis of aorta: Secondary | ICD-10-CM | POA: Diagnosis not present

## 2023-08-30 DIAGNOSIS — R1013 Epigastric pain: Secondary | ICD-10-CM

## 2023-08-30 DIAGNOSIS — I209 Angina pectoris, unspecified: Secondary | ICD-10-CM

## 2023-08-30 DIAGNOSIS — E1169 Type 2 diabetes mellitus with other specified complication: Secondary | ICD-10-CM

## 2023-08-30 DIAGNOSIS — E538 Deficiency of other specified B group vitamins: Secondary | ICD-10-CM | POA: Diagnosis not present

## 2023-08-30 DIAGNOSIS — E11628 Type 2 diabetes mellitus with other skin complications: Secondary | ICD-10-CM | POA: Diagnosis not present

## 2023-08-30 DIAGNOSIS — E559 Vitamin D deficiency, unspecified: Secondary | ICD-10-CM

## 2023-08-30 DIAGNOSIS — M5432 Sciatica, left side: Secondary | ICD-10-CM

## 2023-08-30 DIAGNOSIS — Z79899 Other long term (current) drug therapy: Secondary | ICD-10-CM

## 2023-08-30 DIAGNOSIS — R110 Nausea: Secondary | ICD-10-CM

## 2023-08-30 DIAGNOSIS — G2581 Restless legs syndrome: Secondary | ICD-10-CM | POA: Diagnosis not present

## 2023-08-30 LAB — POCT GLYCOSYLATED HEMOGLOBIN (HGB A1C): Hemoglobin A1C: 6.4 % — AB (ref 4.0–5.6)

## 2023-09-04 LAB — COMPLETE METABOLIC PANEL WITH GFR
AG Ratio: 1.9 (calc) (ref 1.0–2.5)
ALT: 14 U/L (ref 6–29)
AST: 13 U/L (ref 10–35)
Albumin: 4.6 g/dL (ref 3.6–5.1)
Alkaline phosphatase (APISO): 63 U/L (ref 37–153)
BUN: 7 mg/dL (ref 7–25)
CO2: 29 mmol/L (ref 20–32)
Calcium: 10.1 mg/dL (ref 8.6–10.4)
Chloride: 106 mmol/L (ref 98–110)
Creat: 0.66 mg/dL (ref 0.50–1.05)
Globulin: 2.4 g/dL (ref 1.9–3.7)
Glucose, Bld: 102 mg/dL — ABNORMAL HIGH (ref 65–99)
Potassium: 4.9 mmol/L (ref 3.5–5.3)
Sodium: 142 mmol/L (ref 135–146)
Total Bilirubin: 0.7 mg/dL (ref 0.2–1.2)
Total Protein: 7 g/dL (ref 6.1–8.1)
eGFR: 97 mL/min/{1.73_m2} (ref >=60)

## 2023-09-04 LAB — CBC WITH DIFFERENTIAL/PLATELET
Absolute Monocytes: 407 {cells}/uL (ref 200–950)
Basophils Absolute: 71 {cells}/uL (ref 0–200)
Basophils Relative: 1.7 %
Eosinophils Absolute: 336 {cells}/uL (ref 15–500)
Eosinophils Relative: 8 %
HCT: 41.1 % (ref 35.0–45.0)
Hemoglobin: 13.2 g/dL (ref 11.7–15.5)
Lymphs Abs: 1659 {cells}/uL (ref 850–3900)
MCH: 27.1 pg (ref 27.0–33.0)
MCHC: 32.1 g/dL (ref 32.0–36.0)
MCV: 84.4 fL (ref 80.0–100.0)
MPV: 12.5 fL (ref 7.5–12.5)
Monocytes Relative: 9.7 %
Neutro Abs: 1726 {cells}/uL (ref 1500–7800)
Neutrophils Relative %: 41.1 %
Platelets: 212 10*3/uL (ref 140–400)
RBC: 4.87 10*6/uL (ref 3.80–5.10)
RDW: 12.3 % (ref 11.0–15.0)
Total Lymphocyte: 39.5 %
WBC: 4.2 10*3/uL (ref 3.8–10.8)

## 2023-09-04 LAB — LIPID PANEL
Cholesterol: 214 mg/dL — ABNORMAL HIGH (ref <200)
HDL: 73 mg/dL (ref >=50)
LDL Cholesterol (Calc): 114 mg/dL — ABNORMAL HIGH
Non-HDL Cholesterol (Calc): 141 mg/dL — ABNORMAL HIGH (ref <130)
Total CHOL/HDL Ratio: 2.9 (calc) (ref <5.0)
Triglycerides: 156 mg/dL — ABNORMAL HIGH (ref <150)

## 2023-09-04 LAB — LIPASE: Lipase: 15 U/L (ref 7–60)

## 2023-09-04 LAB — VITAMIN D 25 HYDROXY (VIT D DEFICIENCY, FRACTURES): Vit D, 25-Hydroxy: 43 ng/mL (ref 30–100)

## 2023-09-04 LAB — B12 AND FOLATE PANEL
Folate: 13.2 ng/mL
Vitamin B-12: 672 pg/mL (ref 200–1100)

## 2023-09-04 LAB — MICROALBUMIN / CREATININE URINE RATIO
Creatinine, Urine: 109 mg/dL (ref 20–275)
Microalb Creat Ratio: 5 mg/g{creat} (ref <30)
Microalb, Ur: 0.5 mg/dL

## 2023-09-04 LAB — H. PYLORI BREATH TEST: H. pylori Breath Test: NOT DETECTED

## 2023-09-04 LAB — FERRITIN: Ferritin: 136 ng/mL (ref 16–288)

## 2023-09-13 DIAGNOSIS — M3501 Sicca syndrome with keratoconjunctivitis: Secondary | ICD-10-CM | POA: Diagnosis not present

## 2023-09-13 DIAGNOSIS — H2513 Age-related nuclear cataract, bilateral: Secondary | ICD-10-CM | POA: Diagnosis not present

## 2023-09-13 DIAGNOSIS — E119 Type 2 diabetes mellitus without complications: Secondary | ICD-10-CM | POA: Diagnosis not present

## 2023-09-13 DIAGNOSIS — Z0100 Encounter for examination of eyes and vision without abnormal findings: Secondary | ICD-10-CM | POA: Diagnosis not present

## 2023-09-13 LAB — HM DIABETES EYE EXAM

## 2023-09-19 ENCOUNTER — Ambulatory Visit: Payer: 59

## 2023-09-19 VITALS — Ht 68.0 in | Wt 173.0 lb

## 2023-09-19 DIAGNOSIS — Z0000 Encounter for general adult medical examination without abnormal findings: Secondary | ICD-10-CM

## 2023-09-19 NOTE — Progress Notes (Signed)
Subjective:   Ashlee Richardson is a 67 y.o. female who presents for Medicare Annual (Subsequent) preventive examination.  Visit Complete: Virtual  I connected with  Ashlee Richardson on 09/19/23 by a audio enabled telemedicine application and verified that I am speaking with the correct person using two identifiers.  Patient Location: Home  Provider Location: Home Office  I discussed the limitations of evaluation and management by telemedicine. The patient expressed understanding and agreed to proceed.  Because this visit was a virtual/telehealth visit, some criteria may be missing or patient reported. Any vitals not documented were not able to be obtained and vitals that have been documented are patient reported.   Patient Medicare AWV questionnaire was completed by the patient on 09/13/23; I have confirmed that all information answered by patient is correct and no changes since this date. Cardiac Risk Factors include: diabetes mellitus;advanced age (>36men, >9 women)    Objective:    Today's Vitals   09/13/23 1302 09/19/23 0816  Weight:  173 lb (78.5 kg)  Height:  5\' 8"  (1.727 m)  PainSc: 7     Body mass index is 26.3 kg/m.     09/19/2023    8:20 AM 04/02/2023    9:38 AM 08/15/2021    2:28 PM 07/24/2021   11:19 AM 03/01/2021    9:07 AM 12/22/2020    7:54 AM 08/11/2020    2:34 PM  Advanced Directives  Does Patient Have a Medical Advance Directive? No No No No No No No  Would patient like information on creating a medical advance directive?  No - Patient declined No - Patient declined No - Patient declined   No - Patient declined    Current Medications (verified) Outpatient Encounter Medications as of 09/19/2023  Medication Sig   albuterol (VENTOLIN HFA) 108 (90 Base) MCG/ACT inhaler Inhale into the lungs every 6 (six) hours as needed for wheezing or shortness of breath.   Atogepant (QULIPTA) 30 MG TABS Take 1 tablet by mouth daily as needed.   Calcium-Magnesium-Vitamin D  300-150-400 MG-MG-UNIT TABS Take by mouth.   cholecalciferol (VITAMIN D3) 25 MCG (1000 UT) tablet Take 1,000 Units by mouth daily.   DULoxetine (CYMBALTA) 60 MG capsule Take 60 mg by mouth daily.   ezetimibe (ZETIA) 10 MG tablet TAKE 1 TABLET BY MOUTH EVERY DAY   fluticasone-salmeterol (ADVAIR) 100-50 MCG/ACT AEPB Inhale 1 puff into the lungs 2 (two) times daily.   lipase/protease/amylase (CREON) 36000 UNITS CPEP capsule Take 1 tablet with the first bite of each and meal and 1 capsule with the first bite of each snack.   meclizine (ANTIVERT) 25 MG tablet Take 25 mg by mouth 3 (three) times daily as needed for dizziness.   meloxicam (MOBIC) 15 MG tablet TAKE 1 TABLET BY MOUTH EVERY DAY   metoprolol tartrate (LOPRESSOR) 100 MG tablet Take 1 tablet (100 mg total) by mouth once for 1 dose. Take 90-120 minutes prior to scan. Hold for SBP less than 110.   omeprazole (PRILOSEC) 20 MG capsule Take 20 mg by mouth daily.   pregabalin (LYRICA) 50 MG capsule Take 1-2 capsules (50-100 mg total) by mouth at bedtime.   promethazine-dextromethorphan (PROMETHAZINE-DM) 6.25-15 MG/5ML syrup Take by mouth 4 (four) times daily as needed for cough.   QULIPTA 60 MG TABS Take 1 tablet by mouth daily.   rosuvastatin (CRESTOR) 5 MG tablet Take 1 tablet (5 mg total) by mouth daily.   SUMAtriptan (IMITREX) 100 MG tablet Take as directed PRN  migraines   tiZANidine (ZANAFLEX) 2 MG tablet TAKE 1 TABLET (2 MG TOTAL) BY MOUTH 3 (THREE) TIMES DAILY.   valACYclovir (VALTREX) 500 MG tablet Take 1 tablet by mouth 2 (two) times daily.   vitamin B-12 (CYANOCOBALAMIN) 1000 MCG tablet Take 1,000 mcg by mouth daily.   No facility-administered encounter medications on file as of 09/19/2023.    Allergies (verified) Diclofenac-misoprostol, Fenofibric acid, Lipitor [atorvastatin], and Nsaids   History: Past Medical History:  Diagnosis Date   B12 deficiency 12/27/2017   Diverticulitis    Fibromyalgia    GERD (gastroesophageal reflux  disease)    High cholesterol    Migraine    MS (multiple sclerosis) (HCC)    PUD (peptic ulcer disease) 04/28/2018   Past Surgical History:  Procedure Laterality Date   BACK SURGERY  1991?   Pt usure of date   BREAST BIOPSY Right    COLONOSCOPY  09/2007   COLONOSCOPY WITH PROPOFOL N/A 08/28/2017   Procedure: COLONOSCOPY WITH PROPOFOL;  Surgeon: Kieth Brightly, MD;  Location: [facility] ENDOSCOPY;  Service: Endoscopy;  Laterality: N/A;   COLONOSCOPY WITH PROPOFOL N/A 04/02/2023   Procedure: COLONOSCOPY WITH PROPOFOL;  Surgeon: Toney Reil, MD;  Location: [facility];  Service: Endoscopy;  Laterality: N/A;   ESOPHAGOGASTRODUODENOSCOPY (EGD) WITH PROPOFOL N/A 11/24/2018   Procedure: ESOPHAGOGASTRODUODENOSCOPY (EGD) WITH PROPOFOL;  Surgeon: Toney Reil, MD;  Location: [facility] ENDOSCOPY;  Service: Gastroenterology;  Laterality: N/A;   Foot sugery Right    3rd toe from the right"   Family History  Problem Relation Age of Onset   Heart disease Mother    Diabetes Mother    Kidney disease Mother    Hypertension Mother    Heart disease Father    Hyperlipidemia Father    Hypertension Father    Heart disease Other    Cancer Other        Breast   Diabetes Other    Hyperlipidemia Sister    Hypertension Sister    Kidney disease Sister    Diabetes Sister    Lung cancer Paternal Aunt    Cancer Maternal Grandmother        breast   Social History   Socioeconomic History   Marital status: Single    Spouse name: Not on file   Number of children: 2   Years of education: Not on file   Highest education level: Associate degree: academic program  Occupational History    Employer: DISABLED  Tobacco Use   Smoking status: Never   Smokeless tobacco: Never   Tobacco comments:    smoking cessation materials not required  Vaping Use   Vaping status: Never Used  Substance and Sexual Activity   Alcohol use: No    Alcohol/week: 0.0 standard drinks of alcohol   Drug use:  No   Sexual activity: Yes    Birth control/protection: Post-menopausal  Other Topics Concern   Not on file  Social History Narrative   Lives at home by herself.   Disable from chronic back pain since 1993   Diagnosed with MS in the 2000's   Social Determinants of Health   Financial Resource Strain: Low Risk  (09/13/2023)   Overall Financial Resource Strain (CARDIA)    Difficulty of Paying Living Expenses: Not hard at all  Food Insecurity: No Food Insecurity (09/13/2023)   Hunger Vital Sign    Worried About Running Out of Food in the Last Year: Never true    Ran Out of Food  in the Last Year: Never true  Transportation Needs: No Transportation Needs (09/13/2023)   PRAPARE - Administrator, Civil Service (Medical): No    Lack of Transportation (Non-Medical): No  Physical Activity: Insufficiently Active (09/13/2023)   Exercise Vital Sign    Days of Exercise per Week: 4 days    Minutes of Exercise per Session: 30 min  Stress: No Stress Concern Present (09/13/2023)   [employer] of Occupational Health - Occupational Stress Questionnaire    Feeling of Stress : Not at all  Social Connections: Unknown (09/13/2023)   Social Connection and Isolation Panel [NHANES]    Frequency of Communication with Friends and Family: Three times a week    Frequency of Social Gatherings with Friends and Family: More than three times a week    Attends Religious Services: Not on file    Active Member of Clubs or Organizations: Yes    Attends Banker Meetings: 1 to 4 times per year    Marital Status: Never married    Tobacco Counseling Counseling given: Not Answered Tobacco comments: smoking cessation materials not required   Clinical Intake:  Pre-visit preparation completed: Yes  Pain : 0-10 Pain Score: 7  Pain Type: Chronic pain Pain Location: Back Pain Orientation: Lower Pain Descriptors / Indicators: Aching Pain Onset: More than a month ago Pain Relieving Factors:  meloxicam, tylenol  Pain Relieving Factors: meloxicam, tylenol  BMI - recorded: 26.3 Nutritional Status: BMI > 30  Obese Nutritional Risks: None Diabetes: Yes CBG done?: No CBG resulted in Enter/ Edit results?: No Did pt. bring in CBG monitor from home?: No  How often do you need to have someone help you when you read instructions, pamphlets, or other written materials from your doctor or pharmacy?: 1 - Never  Interpreter Needed?: No  Comments: lives alone Information entered by :: B.Tanija Germani,LPN   Activities of Daily Living    09/13/2023    1:02 PM 08/30/2023    8:22 AM  In your present state of health, do you have any difficulty performing the following activities:  Hearing? 0 0  Vision? 0 0  Difficulty concentrating or making decisions? 0 0  Walking or climbing stairs? 0 1  Dressing or bathing? 0 0  Doing errands, shopping? 0 0  Preparing Food and eating ? N   Using the Toilet? N   In the past six months, have you accidently leaked urine? Y   Do you have problems with loss of bowel control? N   Managing your Medications? N   Managing your Finances? N   Housekeeping or managing your Housekeeping? N     Patient Care Team: Alba Cory, MD as PCP - General (Family Medicine) Lonell Face, MD as Consulting Physician (Neurology) Alwyn Pea, MD as Consulting Physician (Cardiology) Eldred Manges, MD as Consulting Physician (Orthopedic Surgery) Elinor Parkinson, [place] as Consulting Physician (Podiatry) [place] Crane, MD as Consulting Physician (Ophthalmology)  Indicate any recent Medical Services you may have received from other than [facility] providers in the past year (date may be approximate).     Assessment:   This is a routine wellness examination for Raymond.  Hearing/Vision screen Hearing Screening - Comments:: Pt says no hearing problems Vision Screening - Comments:: Pt says her vision is a little cloudy but wears glasses   Goals Addressed              This Visit's Progress    DIET - INCREASE WATER  INTAKE   Not on track    Recommend to drink at least 6-8 8oz glasses of water per day.       Depression Screen    09/19/2023    8:39 AM 08/30/2023    8:21 AM 02/28/2023    7:49 AM 08/29/2022    8:19 AM 08/21/2022    8:09 AM 03/20/2022    8:03 AM 02/16/2022   12:58 PM  PHQ 2/9 Scores  PHQ - 2 Score 0 0 0 0 0 0 0  PHQ- 9 Score  2 3 4 9  0 0    Fall Risk    09/13/2023    1:02 PM 08/30/2023    8:21 AM 02/28/2023    7:49 AM 08/29/2022    8:19 AM 08/21/2022    8:13 AM  Fall Risk   Falls in the past year? 0 0 0 0 0  Number falls in past yr:   0 0   Injury with Fall?   0 0   Risk for fall due to : No Fall Risks No Fall Risks No Fall Risks No Fall Risks No Fall Risks  Follow up Education provided;Falls prevention discussed Falls prevention discussed Falls prevention discussed Falls prevention discussed Falls evaluation completed;Education provided;Falls prevention discussed    MEDICARE RISK AT HOME: Medicare Risk at Home Any stairs in or around the home?: No If so, are there any without handrails?: Yes Home free of loose throw rugs in walkways, pet beds, electrical cords, etc?: Yes Adequate lighting in your home to reduce risk of falls?: Yes Life alert?: No Use of a cane, walker or w/c?: No Grab bars in the bathroom?: No Shower chair or bench in shower?: No Elevated toilet seat or a handicapped toilet?: No  TIMED UP AND GO:  Was the test performed?  No    Cognitive Function:        09/19/2023    8:21 AM 08/21/2022    8:12 AM 08/15/2021    2:31 PM 08/11/2020    2:36 PM 08/07/2018    8:47 AM  6CIT Screen  What Year? 0 points 0 points 0 points 0 points 0 points  What month? 0 points 0 points 0 points 0 points 0 points  What time? 0 points 0 points 0 points 0 points 0 points  Count back from 20 0 points 0 points 0 points 0 points 0 points  Months in reverse 0 points 0 points 0 points 0 points 2 points  Repeat phrase 0 points 0  points 0 points 2 points 4 points  Total Score 0 points 0 points 0 points 2 points 6 points    Immunizations Immunization History  Administered Date(s) Administered   Fluad Quad(high Dose 65+) 02/28/2023   Influenza, High Dose Seasonal PF 01/30/2022   Influenza, Quadrivalent, Recombinant, Inj, Pf 01/11/2020   Influenza,inj,Quad PF,6+ Mos 09/20/2020, 09/21/2021   Moderna Covid-19 Vaccine Bivalent Booster 24yrs & up 10/09/2021, 10/15/2022   Moderna Sars-Covid-2 Vaccination 01/29/2020, 02/26/2020, 10/24/2020, 07/27/2021   PNEUMOCOCCAL CONJUGATE-20 08/29/2022   Td 12/25/2015   Zoster Recombinant(Shingrix) 01/12/2020, 07/02/2020    TDAP status: Up to date  Flu Vaccine status: Up to date  Pneumococcal vaccine status: Up to date  Covid-19 vaccine status: Completed vaccines  Qualifies for Shingles Vaccine? Yes   Zostavax completed Yes   Shingrix Completed?: Yes  Screening Tests Health Maintenance  Topic Date Due   COVID-19 Vaccine (7 - 2023-24 season) 08/25/2023   INFLUENZA VACCINE  10/01/2023 (Originally 07/25/2023)  HEMOGLOBIN A1C  02/27/2024   MAMMOGRAM  05/07/2024   Diabetic kidney evaluation - eGFR measurement  08/29/2024   Diabetic kidney evaluation - Urine ACR  08/29/2024   FOOT EXAM  08/29/2024   OPHTHALMOLOGY EXAM  09/12/2024   Medicare Annual Wellness (AWV)  09/18/2024   DTaP/Tdap/Td (2 - Tdap) 12/24/2025   Colonoscopy  04/01/2033   Pneumonia Vaccine 38+ Years old  Completed   DEXA SCAN  Completed   Hepatitis C Screening  Completed   Zoster Vaccines- Shingrix  Completed   HPV VACCINES  Aged Out    Health Maintenance  Health Maintenance Due  Topic Date Due   COVID-19 Vaccine (7 - 2023-24 season) 08/25/2023    Colorectal cancer screening: Type of screening: Colonoscopy. Completed yes. Repeat every 10 years  Mammogram status: Completed yes. Repeat every year  Bone Density status: Completed yes. Results reflect: Bone density results: NORMAL. Repeat every 5  years.  Lung Cancer Screening: (Low Dose CT Chest recommended if Age 77-80 years, 20 pack-year currently smoking OR have quit w/in 15years.) does not qualify.   Lung Cancer Screening Referral: no  Additional Screening:  Hepatitis C Screening: does not qualify; Completed yes  Vision Screening: Recommended annual ophthalmology exams for early detection of glaucoma and other disorders of the eye. Is the patient up to date with their annual eye exam?  Yes  Who is the provider or what is the name of the office in which the patient attends annual eye exams? Dr Brooke Dare If pt is not established with a provider, would they like to be referred to a provider to establish care? No .   Dental Screening: Recommended annual dental exams for proper oral hygiene  Diabetic Foot Exam: Diabetic Foot Exam: Completed yes  Community Resource Referral / Chronic Care Management: CRR required this visit?  No   CCM required this visit?  No    Plan:     I have personally reviewed and noted the following in the patient's chart:   Medical and social history Use of alcohol, tobacco or illicit drugs  Current medications and supplements including opioid prescriptions. Patient is not currently taking opioid prescriptions. Functional ability and status Nutritional status Physical activity Advanced directives List of other physicians Hospitalizations, surgeries, and ER visits in previous 12 months Vitals Screenings to include cognitive, depression, and falls Referrals and appointments  In addition, I have reviewed and discussed with patient certain preventive protocols, quality metrics, and best practice recommendations. A written personalized care plan for preventive services as well as general preventive health recommendations were provided to patient.     Sue Lush, LPN   [id number]   After Visit Summary: (MyChart) Due to this being a telephonic visit, the after visit summary with patients  personalized plan was offered to patient via MyChart   Nurse Notes: The patient states she is doing well and has no concerns or questions at this time.

## 2023-09-19 NOTE — Patient Instructions (Signed)
Ms. Lemarr , Thank you for taking time to come for your Medicare Wellness Visit. I appreciate your ongoing commitment to your health goals. Please review the following plan we discussed and let me know if I can assist you in the future.   Referrals/Orders/Follow-Ups/Clinician Recommendations: none  This is a list of the screening recommended for you and due dates:  Health Maintenance  Topic Date Due   COVID-19 Vaccine (7 - 2023-24 season) 08/25/2023   Flu Shot  10/01/2023*   Hemoglobin A1C  02/27/2024   Mammogram  05/07/2024   Yearly kidney function blood test for diabetes  08/29/2024   Yearly kidney health urinalysis for diabetes  08/29/2024   Complete foot exam   08/29/2024   Eye exam for diabetics  09/12/2024   Medicare Annual Wellness Visit  09/18/2024   DTaP/Tdap/Td vaccine (2 - Tdap) 12/24/2025   Colon Cancer Screening  04/01/2033   Pneumonia Vaccine  Completed   DEXA scan (bone density measurement)  Completed   Hepatitis C Screening  Completed   Zoster (Shingles) Vaccine  Completed   HPV Vaccine  Aged Out  *Topic was postponed. The date shown is not the original due date.    Advanced directives: (Declined) Advance directive discussed with you today. Even though you declined this today, please call our office should you change your mind, and we can give you the proper paperwork for you to fill out.  Next Medicare Annual Wellness Visit scheduled for next year: Yes 09/24/2024 @ 9am telephone

## 2023-09-30 ENCOUNTER — Telehealth: Payer: Self-pay | Admitting: Gastroenterology

## 2023-09-30 NOTE — Telephone Encounter (Signed)
Patient left a voicemail on my phone stating she needed to just make a appointment to see Dr. Allegra Lai but do not call patient till 3:00

## 2023-09-30 NOTE — Telephone Encounter (Signed)
Patient called in to speak with Dr. Allegra Lai or her nurse regarding stomach trouble.

## 2023-09-30 NOTE — Telephone Encounter (Signed)
Called and left a message for call back  

## 2023-10-02 NOTE — Telephone Encounter (Signed)
Recommend to try MiraLAX 17 g in large cup of water daily instead of Senokot.  Try to avoid laxatives  RV

## 2023-10-02 NOTE — Telephone Encounter (Signed)
Patient is having  lower abdominal pain that is all the way across her stomach. She states the pain is constant. The pain is a sharp pain that does not go away. She states she has constipation and once in a while will have diarrhea. Patient states she will have bowel movements 3 times a week. She take Senokot for the constipation. She denies any nausea, vomiting, rectal pain or rectal bleeding. She states she does not eat high fiber foods  She made appointment to see you on 11/18/2023 but wants to know what you recommend till then

## 2023-10-02 NOTE — Telephone Encounter (Signed)
Patient verbalized understanding of instructions. She states she really can not drink to much water because it makes her sick. She states she will try the recommendations though

## 2023-10-04 DIAGNOSIS — J069 Acute upper respiratory infection, unspecified: Secondary | ICD-10-CM | POA: Diagnosis not present

## 2023-10-04 DIAGNOSIS — J029 Acute pharyngitis, unspecified: Secondary | ICD-10-CM | POA: Diagnosis not present

## 2023-10-04 DIAGNOSIS — R059 Cough, unspecified: Secondary | ICD-10-CM | POA: Diagnosis not present

## 2023-10-09 ENCOUNTER — Ambulatory Visit: Payer: 59 | Admitting: Gastroenterology

## 2023-10-11 ENCOUNTER — Encounter: Payer: Self-pay | Admitting: Family Medicine

## 2023-10-11 ENCOUNTER — Ambulatory Visit: Payer: 59 | Admitting: Family Medicine

## 2023-10-11 VITALS — BP 126/70 | HR 78 | Temp 97.9°F | Resp 16 | Ht 68.0 in | Wt 173.3 lb

## 2023-10-11 DIAGNOSIS — J4531 Mild persistent asthma with (acute) exacerbation: Secondary | ICD-10-CM

## 2023-10-11 DIAGNOSIS — J45909 Unspecified asthma, uncomplicated: Secondary | ICD-10-CM | POA: Insufficient documentation

## 2023-10-11 DIAGNOSIS — J4530 Mild persistent asthma, uncomplicated: Secondary | ICD-10-CM | POA: Diagnosis not present

## 2023-10-11 DIAGNOSIS — R058 Other specified cough: Secondary | ICD-10-CM | POA: Diagnosis not present

## 2023-10-11 MED ORDER — AZITHROMYCIN 250 MG PO TABS
ORAL_TABLET | ORAL | 0 refills | Status: AC
Start: 2023-10-11 — End: 2023-10-16

## 2023-10-11 MED ORDER — GUAIFENESIN-CODEINE 100-10 MG/5ML PO SYRP: 5.0000 mL | ORAL_SOLUTION | Freq: Three times a day (TID) | ORAL | 0 refills | Status: DC | PRN

## 2023-10-11 MED ORDER — FLUTICASONE-SALMETEROL 100-50 MCG/ACT IN AEPB: 1.0000 | INHALATION_SPRAY | Freq: Two times a day (BID) | RESPIRATORY_TRACT | 0 refills | Status: DC

## 2023-10-11 NOTE — Progress Notes (Signed)
Name: Ashlee Richardson   MRN: [id number]    DOB: 14-Nov-1956   Date:10/11/2023       Progress Note  Subjective  Chief Complaint  Sore Throat/ Cough  HPI  Viral illness: she states symptoms started with a sore throat on 10/03/2023. She went to Urgent Care on 10/04/2023. She states [facility] checked for COVID, flu, RSV and strep and all negative. She continues to have headaches, productive cough, nasal congestion, wheezing, and this morning she also had diarrhea. She states symptoms are getting worse. Cough is getting worse, wet and purulent sputum with streaks of blood. She finished prednisone, tessalon perles is not working, wheezing is worse at night    Patient Active Problem List   Diagnosis Date Noted   Reactive airway disease 10/11/2023   Screening for colon cancer 04/02/2023   Type 2 diabetes mellitus with pressure callus (HCC) 03/20/2021   Diabetes mellitus type 2, diet-controlled (HCC) 03/20/2021   Onychomycosis of multiple toenails with type 2 diabetes mellitus (HCC) 03/20/2021   Chronic idiopathic constipation 03/20/2021   Lumbar foraminal stenosis 03/08/2021   Myalgia due to statin 05/26/2020   History of lumbar fusion 11/22/2019   GERD (gastroesophageal reflux disease) 04/28/2018   Pure hypercholesterolemia 04/28/2018   Diverticulosis 04/28/2018   RLS (restless legs syndrome) 04/28/2018   Angina pectoris (HCC) 04/28/2018   Vitamin D deficiency 12/27/2017   Chronic fatigue, unspecified 12/27/2017   Intractable migraine without aura and without status migrainosus 10/17/2015   DDD (degenerative disc disease), cervical 05/23/2015   Bilateral occipital neuralgia 05/23/2015   DDD (degenerative disc disease), lumbar 05/23/2015   Multiple sclerosis (HCC) 05/23/2015   Cervical disc disorder with radiculopathy of cervical region 04/20/2015   Right arm numbness 04/20/2015    Past Surgical History:  Procedure Laterality Date   BACK SURGERY  1991?   Pt usure of date   BREAST  BIOPSY Right    COLONOSCOPY  09/2007   COLONOSCOPY WITH PROPOFOL N/A 08/28/2017   Procedure: COLONOSCOPY WITH PROPOFOL;  Surgeon: Kieth Brightly, MD;  Location: [facility] ENDOSCOPY;  Service: Endoscopy;  Laterality: N/A;   COLONOSCOPY WITH PROPOFOL N/A 04/02/2023   Procedure: COLONOSCOPY WITH PROPOFOL;  Surgeon: Toney Reil, MD;  Location: [facility];  Service: Endoscopy;  Laterality: N/A;   ESOPHAGOGASTRODUODENOSCOPY (EGD) WITH PROPOFOL N/A 11/24/2018   Procedure: ESOPHAGOGASTRODUODENOSCOPY (EGD) WITH PROPOFOL;  Surgeon: Toney Reil, MD;  Location[facility] END[facility];  Service: Gastroenterology;  Laterality: N/A;   Foot sugery Right    3rd toe from the right"    Family History  Problem Relation Age of Onset   Heart disease Mother    Diabetes Mother    Kidney disease Mother    Hypertension Mother    Heart disease Father    Hyperlipidemia Father    Hypertension Father    Heart disease Other    Cancer Other        Breast   Diabetes Other    Hyperlipidemia Sister    Hypertension Sister    Kidney disease Sister    Diabetes Sister    Lung cancer Paternal Aunt    Cancer Maternal Grandmother        breast    Social History   Tobacco Use   Smoking status: Never   Smokeless tobacco: Never   Tobacco comments:    smoking cessation materials not required  Substance Use Topics   Alcohol use: No    Alcohol/week: 0.0 standard drinks of alcohol  Current Outpatient Medications:    Atogepant (QULIPTA) 30 MG TABS, Take 1 tablet by mouth daily as needed., Disp: , Rfl:    azithromycin (ZITHROMAX) 250 MG tablet, Take 2 tablets on day 1, then 1 tablet daily on days 2 through 5, Disp: 6 tablet, Rfl: 0   Calcium-Magnesium-Vitamin D 300-150-400 MG-MG-UNIT TABS, Take by mouth., Disp: , Rfl:    cholecalciferol (VITAMIN D3) 25 MCG (1000 UT) tablet, Take 1,000 Units by mouth daily., Disp: , Rfl:    DULoxetine (CYMBALTA) 60 MG capsule, Take 60 mg by mouth daily., Disp: ,  Rfl:    ezetimibe (ZETIA) 10 MG tablet, TAKE 1 TABLET BY MOUTH EVERY DAY, Disp: 90 tablet, Rfl: 0   guaiFENesin-codeine (ROBITUSSIN AC) 100-10 MG/5ML syrup, Take 5 mLs by mouth 3 (three) times daily as needed for cough., Disp: 120 mL, Rfl: 0   lipase/protease/amylase (CREON) 36000 UNITS CPEP capsule, Take 1 tablet with the first bite of each and meal and 1 capsule with the first bite of each snack., Disp: 180 capsule, Rfl: 2   meclizine (ANTIVERT) 25 MG tablet, Take 25 mg by mouth 3 (three) times daily as needed for dizziness., Disp: , Rfl:    meloxicam (MOBIC) 15 MG tablet, TAKE 1 TABLET BY MOUTH EVERY DAY, Disp: 90 tablet, Rfl: 0   omeprazole (PRILOSEC) 20 MG capsule, Take 20 mg by mouth daily., Disp: , Rfl:    pregabalin (LYRICA) 50 MG capsule, Take 1-2 capsules (50-100 mg total) by mouth at bedtime., Disp: 60 capsule, Rfl: 2   QULIPTA 60 MG TABS, Take 1 tablet by mouth daily., Disp: , Rfl:    rosuvastatin (CRESTOR) 5 MG tablet, Take 1 tablet (5 mg total) by mouth daily., Disp: 90 tablet, Rfl: 1   SUMAtriptan (IMITREX) 100 MG tablet, Take as directed PRN migraines, Disp: , Rfl:    tiZANidine (ZANAFLEX) 2 MG tablet, TAKE 1 TABLET (2 MG TOTAL) BY MOUTH 3 (THREE) TIMES DAILY., Disp: , Rfl:    vitamin B-12 (CYANOCOBALAMIN) 1000 MCG tablet, Take 1,000 mcg by mouth daily., Disp: , Rfl:    albuterol (VENTOLIN HFA) 108 (90 Base) MCG/ACT inhaler, Inhale into the lungs every 6 (six) hours as needed for wheezing or shortness of breath. (Patient not taking: Reported on 10/11/2023), Disp: , Rfl:    fluticasone-salmeterol (ADVAIR) 100-50 MCG/ACT AEPB, Inhale 1 puff into the lungs 2 (two) times daily., Disp: 60 each, Rfl: 0   metoprolol tartrate (LOPRESSOR) 100 MG tablet, Take 1 tablet (100 mg total) by mouth once for 1 dose. Take 90-120 minutes prior to scan. Hold for SBP less than 110., Disp: 1 tablet, Rfl: 0  Allergies  Allergen Reactions   Diclofenac-Misoprostol Other (See Comments)    "Heart racing"    Fenofibric Acid     Pt doesn't remember reaction   Lipitor [Atorvastatin]     Chest pain   Nsaids Hives    I personally reviewed active problem list, medication list, allergies, family history, social history, health maintenance with the patient/caregiver today.   ROS  Ten systems reviewed and is negative except as mentioned in HPI    Objective  Vitals:   10/11/23 1034  BP: 126/70  Pulse: 78  Resp: 16  Temp: 97.9 F (36.6 C)  TempSrc: Oral  SpO2: 98%  Weight: 173 lb 4.8 oz (78.6 kg)  Height: 5\' 8"  (1.727 m)    Body mass index is 26.35 kg/m.  Physical Exam  Constitutional: Patient appears well-developed and well-nourished. No distress.  HEENT:  head atraumatic, normocephalic, pupils equal and reactive to light, ears normal TM , neck supple, throat within normal limits Cardiovascular: Normal rate, regular rhythm and normal heart sounds.  No murmur heard. No BLE edema. Pulmonary/Chest: Effort normal and breath sounds normal. No respiratory distress. Abdominal: Soft.  There is no tenderness. Psychiatric: Patient has a normal mood and affect. behavior is normal. Judgment and thought content normal.    PHQ2/9:    10/11/2023   10:37 AM 09/19/2023    8:39 AM 08/30/2023    8:21 AM 02/28/2023    7:49 AM 08/29/2022    8:19 AM  Depression screen PHQ 2/9  Decreased Interest 0 0 0 0 0  Down, Depressed, Hopeless 0 0 0 0 0  PHQ - 2 Score 0 0 0 0 0  Altered sleeping 3  0 0 1  Tired, decreased energy 3  1 3 3   Change in appetite 2  1 0 0  Feeling bad or failure about yourself  0  0 0 0  Trouble concentrating 0  0 0 0  Moving slowly or fidgety/restless 0  0 0 0  Suicidal thoughts 0  0 0 0  PHQ-9 Score 8  2 3 4   Difficult doing work/chores Very difficult  Not difficult at all      phq 9 is negative   Fall Risk:    10/11/2023   10:37 AM 09/13/2023    1:02 PM 08/30/2023    8:21 AM 02/28/2023    7:49 AM 08/29/2022    8:19 AM  Fall Risk   Falls in the past year? 0 0 0 0 0   Number falls in past yr:    0 0  Injury with Fall?    0 0  Risk for fall due to : No Fall Risks No Fall Risks No Fall Risks No Fall Risks No Fall Risks  Follow up Falls prevention discussed Education provided;Falls prevention discussed Falls prevention discussed Falls prevention discussed Falls prevention discussed   Assessment & Plan  1. Mild persistent reactive airway disease with wheezing with acute exacerbation  - fluticasone-salmeterol (ADVAIR) 100-50 MCG/ACT AEPB; Inhale 1 puff into the lungs 2 (two) times daily.  Dispense: 60 each; Refill: 0  2. Productive cough  - azithromycin (ZITHROMAX) 250 MG tablet; Take 2 tablets on day 1, then 1 tablet daily on days 2 through 5  Dispense: 6 tablet; Refill: 0 - guaiFENesin-codeine (ROBITUSSIN AC) 100-10 MG/5ML syrup; Take 5 mLs by mouth 3 (three) times daily as needed for cough.  Dispense: 120 mL; Refill: 0

## 2023-11-07 ENCOUNTER — Other Ambulatory Visit: Payer: Self-pay | Admitting: Family Medicine

## 2023-11-07 DIAGNOSIS — J4531 Mild persistent asthma with (acute) exacerbation: Secondary | ICD-10-CM

## 2023-11-18 ENCOUNTER — Ambulatory Visit: Payer: 59 | Admitting: Gastroenterology

## 2023-11-28 ENCOUNTER — Ambulatory Visit: Payer: 59 | Admitting: Physician Assistant

## 2023-12-09 ENCOUNTER — Other Ambulatory Visit: Payer: Self-pay | Admitting: Family Medicine

## 2023-12-09 DIAGNOSIS — J4531 Mild persistent asthma with (acute) exacerbation: Secondary | ICD-10-CM

## 2024-01-06 DIAGNOSIS — G3184 Mild cognitive impairment, so stated: Secondary | ICD-10-CM | POA: Diagnosis not present

## 2024-01-06 DIAGNOSIS — G43019 Migraine without aura, intractable, without status migrainosus: Secondary | ICD-10-CM | POA: Diagnosis not present

## 2024-01-06 DIAGNOSIS — R2689 Other abnormalities of gait and mobility: Secondary | ICD-10-CM | POA: Diagnosis not present

## 2024-01-17 DIAGNOSIS — U071 COVID-19: Secondary | ICD-10-CM | POA: Diagnosis not present

## 2024-01-17 DIAGNOSIS — J0140 Acute pansinusitis, unspecified: Secondary | ICD-10-CM | POA: Diagnosis not present

## 2024-01-17 DIAGNOSIS — J029 Acute pharyngitis, unspecified: Secondary | ICD-10-CM | POA: Diagnosis not present

## 2024-02-03 DIAGNOSIS — I10 Essential (primary) hypertension: Secondary | ICD-10-CM | POA: Diagnosis not present

## 2024-02-03 DIAGNOSIS — R931 Abnormal findings on diagnostic imaging of heart and coronary circulation: Secondary | ICD-10-CM | POA: Diagnosis not present

## 2024-02-03 DIAGNOSIS — J45909 Unspecified asthma, uncomplicated: Secondary | ICD-10-CM | POA: Diagnosis not present

## 2024-02-03 DIAGNOSIS — G43019 Migraine without aura, intractable, without status migrainosus: Secondary | ICD-10-CM | POA: Diagnosis not present

## 2024-02-03 DIAGNOSIS — R001 Bradycardia, unspecified: Secondary | ICD-10-CM | POA: Diagnosis not present

## 2024-02-03 DIAGNOSIS — I700 Atherosclerosis of aorta: Secondary | ICD-10-CM | POA: Diagnosis not present

## 2024-02-03 DIAGNOSIS — K219 Gastro-esophageal reflux disease without esophagitis: Secondary | ICD-10-CM | POA: Diagnosis not present

## 2024-02-03 DIAGNOSIS — E7800 Pure hypercholesterolemia, unspecified: Secondary | ICD-10-CM | POA: Diagnosis not present

## 2024-02-03 DIAGNOSIS — R600 Localized edema: Secondary | ICD-10-CM | POA: Diagnosis not present

## 2024-02-03 DIAGNOSIS — E119 Type 2 diabetes mellitus without complications: Secondary | ICD-10-CM | POA: Diagnosis not present

## 2024-02-03 DIAGNOSIS — R079 Chest pain, unspecified: Secondary | ICD-10-CM | POA: Diagnosis not present

## 2024-02-03 DIAGNOSIS — R5382 Chronic fatigue, unspecified: Secondary | ICD-10-CM | POA: Diagnosis not present

## 2024-02-05 ENCOUNTER — Other Ambulatory Visit: Payer: Self-pay | Admitting: Family Medicine

## 2024-02-05 DIAGNOSIS — I700 Atherosclerosis of aorta: Secondary | ICD-10-CM

## 2024-02-05 DIAGNOSIS — E1169 Type 2 diabetes mellitus with other specified complication: Secondary | ICD-10-CM

## 2024-02-05 NOTE — Telephone Encounter (Signed)
Medication Refill -  Most Recent Primary Care Visit:  Provider: Alba Cory  Department: [facility]  Visit Type: OFFICE VISIT  Date: 10/11/2023  Medication: rosuvastatin (CRESTOR) 5 MG tablet [[id number]]   Has the patient contacted their pharmacy? Yes  (Agent: If yes, when and what did the pharmacy advise?) Contact Office   Is this the correct pharmacy for this prescription? Yes If no, delete pharmacy and type the correct one.  This is the patient's preferred pharmacy:  [facility] [address], [place] - [address] [place] [address] Phone: [phone] Fax: [phone]   Has the prescription been filled recently? Yes  Is the patient out of the medication? Yes  Has the patient been seen for an appointment in the last year OR does the patient have an upcoming appointment? Yes  Can we respond through MyChart? Yes  Agent: Please be advised that Rx refills may take up to 3 business days. We ask that you follow-up with your pharmacy.

## 2024-02-06 MED ORDER — ROSUVASTATIN CALCIUM 5 MG PO TABS: 5.0000 mg | ORAL_TABLET | Freq: Every day | ORAL | 1 refills | Status: DC

## 2024-02-06 NOTE — Telephone Encounter (Signed)
Requested Prescriptions  Pending Prescriptions Disp Refills   rosuvastatin (CRESTOR) 5 MG tablet 90 tablet 1    Sig: Take 1 tablet (5 mg total) by mouth daily.     Cardiovascular:  Antilipid - Statins 2 Failed - 02/06/2024 11:53 AM      Failed - Lipid Panel in normal range within the last 12 months    Cholesterol  Date Value Ref Range Status  08/30/2023 214 (H) <200 mg/dL Final   LDL Cholesterol (Calc)  Date Value Ref Range Status  08/30/2023 114 (H) mg/dL (calc) Final    Comment:    Reference range: <100 . Desirable range <100 mg/dL for primary prevention;   <70 mg/dL for patients with CHD or diabetic patients  with > or = 2 CHD risk factors. Marland Kitchen LDL-C is now calculated using the Martin-Hopkins  calculation, which is a validated novel method providing  better accuracy than the Friedewald equation in the  estimation of LDL-C.  Horald Pollen et al. Lenox Ahr. 6045;409(81): 2061-2068  (http://education.[facility].com/faq/FAQ164)    HDL  Date Value Ref Range Status  08/30/2023 73 > OR = 50 mg/dL Final   Triglycerides  Date Value Ref Range Status  08/30/2023 156 (H) <150 mg/dL Final         Passed - Cr in normal range and within 360 days    Creat  Date Value Ref Range Status  08/30/2023 0.66 0.50 - 1.05 mg/dL Final   Creatinine, Urine  Date Value Ref Range Status  08/30/2023 109 20 - 275 mg/dL Final         Passed - Patient is not pregnant      Passed - Valid encounter within last 12 months    Recent Outpatient Visits           3 months ago Mild persistent reactive airway disease with wheezing with acute exacerbation   [facility], Danna Hefty, MD   5 months ago Dyslipidemia associated with type 2 diabetes mellitus [facility])   [facility], Danna Hefty, MD   11 months ago Type 2 diabetes mellitus with pressure callus [facility])   [facility] [place], Danna Hefty, MD   1 year ago Type 2 diabetes  mellitus with pressure callus [facility])   [place] [facility] [place] Alba Cory, MD   1 year ago Type 2 diabetes mellitus with pressure callus [facility])   [facility] Alba Cory, MD

## 2024-02-17 ENCOUNTER — Other Ambulatory Visit: Payer: Self-pay | Admitting: Gastroenterology

## 2024-04-07 ENCOUNTER — Ambulatory Visit: Admitting: Gastroenterology

## 2024-04-13 NOTE — Telephone Encounter (Signed)
 Copied from CRM [id number]. Topic: Clinical - Request for Lab/Test Order >> Apr 13, 2024 11:25 AM Rennis Case wrote: Reason for CRM: Patient is trying to get a diagnostic mammogram at [facility]. Patient informed that order would need to be sent by Dr. Ava Lei  Patient requesting order be sent to [facility] - [id number]  [address]  [place], [place] [address]

## 2024-04-27 ENCOUNTER — Ambulatory Visit (INDEPENDENT_AMBULATORY_CARE_PROVIDER_SITE_OTHER): Admitting: Gastroenterology

## 2024-04-27 ENCOUNTER — Encounter: Payer: Self-pay | Admitting: Gastroenterology

## 2024-04-27 ENCOUNTER — Other Ambulatory Visit: Payer: Self-pay

## 2024-04-27 VITALS — BP 149/79 | HR 62 | Temp 97.7°F | Ht 68.0 in | Wt 172.5 lb

## 2024-04-27 DIAGNOSIS — R1319 Other dysphagia: Secondary | ICD-10-CM

## 2024-04-27 DIAGNOSIS — K5904 Chronic idiopathic constipation: Secondary | ICD-10-CM

## 2024-04-27 DIAGNOSIS — K219 Gastro-esophageal reflux disease without esophagitis: Secondary | ICD-10-CM | POA: Diagnosis not present

## 2024-04-27 DIAGNOSIS — R1013 Epigastric pain: Secondary | ICD-10-CM

## 2024-04-27 DIAGNOSIS — K5900 Constipation, unspecified: Secondary | ICD-10-CM | POA: Diagnosis not present

## 2024-04-27 DIAGNOSIS — R1310 Dysphagia, unspecified: Secondary | ICD-10-CM

## 2024-04-27 MED ORDER — LINACLOTIDE 145 MCG PO CAPS: 145.0000 ug | ORAL_CAPSULE | Freq: Every day | ORAL | 2 refills | Status: AC

## 2024-04-27 MED ORDER — OMEPRAZOLE 20 MG PO CPDR: 20.0000 mg | DELAYED_RELEASE_CAPSULE | Freq: Every day | ORAL | 2 refills | Status: AC

## 2024-04-27 NOTE — Patient Instructions (Addendum)
 Gave samples of Linzess 145 take 1 tablet every morning.  Do clear liquids one day all day and then began prep around 3 to 4:00pm Mix 64 ounces of Gatorade with 238 grams of miralax. Drink 8oz every 20 to 30 minutes till solution is gone.

## 2024-04-27 NOTE — Progress Notes (Signed)
 Ashlee Oz, MD [address]  [address]  [place], [place] [address]  Main: [phone]  Fax: [phone]    Gastroenterology Consultation  Referring Provider:     Arleen Lacer, MD Primary Care Physician:  Sowles, [name], MD Primary Gastroenterologist:  Dr. Karma Richardson Reason for Consultation: Abdominal bloating, constipation       HPI:   Ashlee Richardson is a 68 y.o. female referred by Dr. Arleen Lacer, MD  for consultation & management of chronic lower abdominal pain and bloating.  Patient has been originally evaluated by me in 2021 and then lost to follow-up.  She reports that she has been dealing with symptoms of chronic lower abdominal pain associated with feeling gassy, loose mushy bowel movements but no diarrhea.  She was having diarrhea when she was on antibiotics for upper respiratory infection.  She reports that she is no longer experiencing constipation.  She also reports upper abdominal discomfort with regurgitation and burning in her throat.  She states she is tired of taking medications and wants further workup.  She has lost about 3 to 4 pounds within the last 6 months.  She reports having good appetite.  She denies consumption of carbonated beverages, sweet tea.  She reports drinking about a gallon of water  Richardson.  She does not smoke or drink alcohol.  Her workup in the past included CT abdomen pelvis with contrast which revealed sigmoid diverticulosis only. GI pathogen panel including C. difficile was negative in July 2023  Follow-up visit 03/06/2023 Patient is here for follow-up of lower abdominal discomfort with abdominal bloating, loose stools.  She reports her symptoms have resolved.  She underwent stool studies, negative for infection.  Her pancreatic fecal elastase levels were 114.  Started her on Creon  36 K 1 capsule with each meal and 1 with snack.  Patient reports she has been taking 3 times Richardson.  Her bloating and cramps have resolved.  However,  she has been experiencing irregular bowel movements, about once a week.  She has to take MiraLAX in order to have a bowel movement.  She states that she has been eating salads twice Richardson with ranch dressing.  Her weight is stable.  She does not have any other concerns today.  Follow-up visit 04/27/2024 Ashlee Richardson is here to discuss about severe constipation that she has been experiencing last several months associated with significant abdominal bloating.  She has bowel movement about once or twice a week only associated with significant straining and stools are hard.  She tried MiraLAX which did not help as well as Senokot resulted in severe cramps.  She also reports 1 month history of difficulty swallowing and is interested to undergo upper endoscopy   NSAIDs: Mobic  for chronic back pain  Antiplts/Anticoagulants/Anti thrombotics: none  GI Procedures:  colonoscopy 2018 by Dr Ashlee Richardson Diverticulosis, otherwise normal colon Colonoscopy in 09/2007  EGD 11/24/2018 - Normal duodenal bulb, second portion of the duodenum and third portion of the duodenum. - 1 cm hiatal hernia. - Normal stomach. Biopsied. - Esophagogastric landmarks identified. - Normal gastroesophageal junction and esophagus. Biopsied.  DIAGNOSIS:  A.  STOMACH, RANDOM; COLD BIOPSY:  - FOCAL MILD NON-SPECIFIC CHRONIC GASTRITIS.  - NEGATIVE FOR H. PYLORI, DYSPLASIA, AND MALIGNANCY.   B.  ESOPHAGUS; COLD BIOPSY:  - UNREMARKALBE SQUAMOUS MUCOSA.  - NEGATIVE FOR EOSINOPHILS, DYSPLASIA, AND MALIGNANCY.  - FRAGMENT OF UNREMARKABLE GASTRIC MUCOSA.   Colonoscopy 04/02/23 - Non- bleeding external hemorrhoids. - Diverticulosis in the entire  examined colon. - No specimens collected.   Past Medical History:  Diagnosis Date   B12 deficiency 12/27/2017   Diverticulitis    Fibromyalgia    GERD (gastroesophageal reflux disease)    High cholesterol    Migraine    MS (multiple sclerosis) (HCC)    PUD (peptic ulcer disease) 04/28/2018     Past Surgical History:  Procedure Laterality Date   BACK SURGERY  1991?   Pt usure of date   BREAST BIOPSY Right    COLONOSCOPY  09/2007   COLONOSCOPY WITH PROPOFOL  N/A 08/28/2017   Procedure: COLONOSCOPY WITH PROPOFOL ;  Surgeon: Ashlee Mood, MD;  Location: [facility];  Service: Endoscopy;  Laterality: N/A;   COLONOSCOPY WITH PROPOFOL  N/A 04/02/2023   Procedure: COLONOSCOPY WITH PROPOFOL ;  Surgeon: Ashlee Daily, MD;  Location: [facility];  Service: Endoscopy;  Laterality: N/A;   ESOPHAGOGASTRODUODENOSCOPY (EGD) WITH PROPOFOL  N/A 11/24/2018   Procedure: ESOPHAGOGASTRODUODENOSCOPY (EGD) WITH PROPOFOL ;  Surgeon: Ashlee Daily, MD;  Location: [facility];  Service: Gastroenterology;  Laterality: N/A;   Foot sugery Right    3rd toe from the right"    Current Outpatient Medications:    albuterol (VENTOLIN HFA) 108 (90 Base) MCG/ACT inhaler, Inhale into the lungs every 6 (six) hours as needed for wheezing or shortness of breath., Disp: , Rfl:    Atogepant (QULIPTA) 30 MG TABS, Take 1 tablet by mouth Richardson as needed., Disp: , Rfl:    Calcium -Magnesium -Vitamin D  300-150-400 MG-MG-UNIT TABS, Take by mouth., Disp: , Rfl:    cholecalciferol (VITAMIN D3) 25 MCG (1000 UT) tablet, Take 1,000 Units by mouth Richardson., Disp: , Rfl:    DULoxetine  (CYMBALTA ) 60 MG capsule, Take 60 mg by mouth Richardson., Disp: , Rfl:    ezetimibe  (ZETIA ) 10 MG tablet, TAKE 1 TABLET BY MOUTH EVERY DAY, Disp: 90 tablet, Rfl: 0   fluticasone -salmeterol (WIXELA INHUB) 100-50 MCG/ACT AEPB, INHALE 1 PUFF INTO THE LUNGS TWICE A DAY, Disp: 60 each, Rfl: 1   guaiFENesin -codeine  (ROBITUSSIN AC) 100-10 MG/5ML syrup, Take 5 mLs by mouth 3 (three) times Richardson as needed for cough., Disp: 120 mL, Rfl: 0   linaclotide (LINZESS) 145 MCG CAPS capsule, Take 1 capsule (145 mcg total) by mouth Richardson before breakfast., Disp: 30 capsule, Rfl: 2   meclizine (ANTIVERT) 25 MG tablet, Take 25 mg by mouth 3 (three)  times Richardson as needed for dizziness., Disp: , Rfl:    meloxicam  (MOBIC ) 15 MG tablet, TAKE 1 TABLET BY MOUTH EVERY DAY, Disp: 90 tablet, Rfl: 0   QULIPTA 60 MG TABS, Take 1 tablet by mouth Richardson., Disp: , Rfl:    rosuvastatin  (CRESTOR ) 5 MG tablet, Take 1 tablet (5 mg total) by mouth Richardson., Disp: 90 tablet, Rfl: 1   SUMAtriptan (IMITREX) 100 MG tablet, Take as directed PRN migraines, Disp: , Rfl:    tiZANidine  (ZANAFLEX ) 2 MG tablet, TAKE 1 TABLET (2 MG TOTAL) BY MOUTH 3 (THREE) TIMES Richardson., Disp: , Rfl:    lipase/protease/amylase (CREON ) 36000 UNITS CPEP capsule, Take 1 tablet with the first bite of each and meal and 1 capsule with the first bite of each snack. (Patient not taking: Reported on 04/27/2024), Disp: 180 capsule, Rfl: 2   omeprazole  (PRILOSEC) 20 MG capsule, Take 1 capsule (20 mg total) by mouth Richardson before breakfast., Disp: 30 capsule, Rfl: 2   vitamin B-12 (CYANOCOBALAMIN ) 1000 MCG tablet, Take 1,000 mcg by mouth Richardson. (Patient not taking: Reported on 04/27/2024), Disp: , Rfl:  Family History  Problem Relation Age of Onset   Heart disease Mother    Diabetes Mother    Kidney disease Mother    Hypertension Mother    Heart disease Father    Hyperlipidemia Father    Hypertension Father    Heart disease Other    Cancer Other        Breast   Diabetes Other    Hyperlipidemia Sister    Hypertension Sister    Kidney disease Sister    Diabetes Sister    Lung cancer Paternal Aunt    Cancer Maternal Grandmother        breast     Social History   Tobacco Use   Smoking status: Never   Smokeless tobacco: Never   Tobacco comments:    smoking cessation materials not required  Vaping Use   Vaping status: Never Used  Substance Use Topics   Alcohol use: No    Alcohol/week: 0.0 standard drinks of alcohol   Drug use: No    Allergies as of 04/27/2024 - Review Complete 04/27/2024  Allergen Reaction Noted   Diclofenac-misoprostol Other (See Comments) 08/15/2021   Fenofibric  acid  04/20/2015   Lipitor [atorvastatin]  04/20/2015   Nsaids Hives 04/20/2015    Review of Systems:    All systems reviewed and negative except where noted in HPI.   Physical Exam:  BP (!) 149/79 (BP Location: Left Arm, Patient Position: Sitting, Cuff Size: Normal)   Pulse 62   Temp 97.7 F (36.5 C) (Oral)   Ht 5\' 8"  (1.727 m)   Wt 172 lb 8 Richardson (78.2 kg)   BMI 26.23 kg/m  No LMP recorded. Patient is postmenopausal.  General:   Alert,  Well-developed, well-nourished, pleasant and cooperative in NAD, patient is uncomfortable sitting due to low back pain Head:  Normocephalic and atraumatic. Eyes:  Sclera clear, no icterus.   Conjunctiva pink. Ears:  Normal auditory acuity. Nose:  No deformity, discharge, or lesions. Mouth:  No deformity or lesions,oropharynx pink & moist. Neck:  Supple; no masses or thyromegaly. Lungs:  Respirations even and unlabored.  Clear throughout to auscultation.   No wheezes, crackles, or rhonchi. No acute distress. Heart:  Regular rate and rhythm; no murmurs, clicks, rubs, or gallops. Abdomen:  Normal bowel sounds. Soft, moderately distended, tympanic, nontender, without masses, hepatosplenomegaly or hernias noted.  No guarding or rebound tenderness.   Rectal: Solid stool in the rectal vault Msk:  Symmetrical without gross deformities. Good, equal movement & strength bilaterally. Pulses:  Normal pulses noted. Extremities:  No clubbing or edema.  No cyanosis. Neurologic:  Alert and oriented x3;  grossly normal neurologically. Skin:  Intact without significant lesions or rashes. No jaundice. Psych:  Alert and cooperative. Normal Richardson and affect.  Imaging Studies: Reviewed  Assessment and Plan:   Ashlee Richardson is a 68 y.o. African-American female with severe constipation, abdominal bloating and GERD with history of dysphagia  Severe constipation with abdominal bloating Colonoscopy in 03/2023 was unremarkable Recommend bowel cleanout, then start  Linzess 145 mcg Richardson  Esophageal dysphagia Start omeprazole  20 mg Richardson before breakfast Recommend EGD for further evaluation   Follow up as needed  Ashlee Oz, MD

## 2024-05-04 ENCOUNTER — Ambulatory Visit: Admitting: Gastroenterology

## 2024-05-07 ENCOUNTER — Encounter: Payer: Self-pay | Admitting: Gastroenterology

## 2024-05-14 ENCOUNTER — Encounter
Admission: RE | Disposition: A | Discharge status: Home / Self Care | Payer: Self-pay | Source: Ambulatory Visit | Attending: Gastroenterology

## 2024-05-14 ENCOUNTER — Other Ambulatory Visit: Payer: Self-pay

## 2024-05-14 ENCOUNTER — Ambulatory Visit
Admission: RE | Admit: 2024-05-14 | Discharge: 2024-05-14 | Disposition: A | Discharge status: Home / Self Care | Source: Ambulatory Visit | Attending: Gastroenterology | Admitting: Gastroenterology

## 2024-05-14 ENCOUNTER — Ambulatory Visit

## 2024-05-14 ENCOUNTER — Encounter: Payer: Self-pay | Admitting: Gastroenterology

## 2024-05-14 DIAGNOSIS — K219 Gastro-esophageal reflux disease without esophagitis: Secondary | ICD-10-CM | POA: Diagnosis not present

## 2024-05-14 DIAGNOSIS — R1013 Epigastric pain: Secondary | ICD-10-CM

## 2024-05-14 DIAGNOSIS — R1310 Dysphagia, unspecified: Secondary | ICD-10-CM | POA: Diagnosis not present

## 2024-05-14 DIAGNOSIS — Z8711 Personal history of peptic ulcer disease: Secondary | ICD-10-CM | POA: Insufficient documentation

## 2024-05-14 HISTORY — PX: ESOPHAGOGASTRODUODENOSCOPY: SHX5428

## 2024-05-14 SURGERY — EGD (ESOPHAGOGASTRODUODENOSCOPY)
Anesthesia: General

## 2024-05-14 MED ORDER — PROPOFOL 1000 MG/100ML IV EMUL
INTRAVENOUS | Status: AC
Start: 2024-05-14 — End: ?
  Filled 2024-05-14: qty 100

## 2024-05-14 MED ORDER — LIDOCAINE HCL (CARDIAC) PF 100 MG/5ML IV SOSY
PREFILLED_SYRINGE | INTRAVENOUS | Status: DC | PRN
  Administered 2024-05-14: 5 mg via INTRAVENOUS

## 2024-05-14 MED ORDER — LIDOCAINE HCL (PF) 2 % IJ SOLN
INTRAMUSCULAR | Status: AC
  Filled 2024-05-14: qty 5

## 2024-05-14 MED ORDER — SODIUM CHLORIDE 0.9 % IV SOLN: INTRAVENOUS | Status: DC

## 2024-05-14 MED ORDER — GLYCOPYRROLATE 0.2 MG/ML IJ SOLN
INTRAMUSCULAR | Status: DC | PRN
  Administered 2024-05-14: .2 mg via INTRAVENOUS

## 2024-05-14 MED ORDER — PROPOFOL 10 MG/ML IV BOLUS
INTRAVENOUS | Status: AC
Start: 2024-05-14 — End: ?
  Filled 2024-05-14: qty 20

## 2024-05-14 MED ORDER — GLYCOPYRROLATE 0.2 MG/ML IJ SOLN
INTRAMUSCULAR | Status: AC
  Filled 2024-05-14: qty 1

## 2024-05-14 MED ORDER — PROPOFOL 10 MG/ML IV BOLUS
INTRAVENOUS | Status: DC | PRN
  Administered 2024-05-14: 50 mg via INTRAVENOUS

## 2024-05-14 MED ORDER — ALBUTEROL SULFATE HFA 108 (90 BASE) MCG/ACT IN AERS
INHALATION_SPRAY | RESPIRATORY_TRACT | Status: DC | PRN
  Administered 2024-05-14: 4 via RESPIRATORY_TRACT

## 2024-05-14 MED ORDER — PROPOFOL 500 MG/50ML IV EMUL
INTRAVENOUS | Status: DC | PRN
  Administered 2024-05-14: 100 ug/kg/min via INTRAVENOUS

## 2024-05-14 MED ORDER — ALBUTEROL SULFATE HFA 108 (90 BASE) MCG/ACT IN AERS
INHALATION_SPRAY | RESPIRATORY_TRACT | Status: AC
  Filled 2024-05-14: qty 6.7

## 2024-05-14 NOTE — H&P (Signed)
 Ashlee Oz, MD [address]  [address]  [place], [place] [address]  Main: [phone]  Fax: [phone] Pager: [phone]  Primary Care Physician:  Arleen Lacer, MD Primary Gastroenterologist:  Dr. Karma Richardson  Pre-Procedure History & Physical: HPI:  Ashlee Richardson is a 68 y.o. female is here for an endoscopy.   Past Medical History:  Diagnosis Date   B12 deficiency 12/27/2017   Diverticulitis    Fibromyalgia    GERD (gastroesophageal reflux disease)    High cholesterol    Migraine    MS (multiple sclerosis) (HCC)    PUD (peptic ulcer disease) 04/28/2018    Past Surgical History:  Procedure Laterality Date   BACK SURGERY  1991?   Pt usure of date   BREAST BIOPSY Right    COLONOSCOPY  09/2007   COLONOSCOPY WITH PROPOFOL  N/A 08/28/2017   Procedure: COLONOSCOPY WITH PROPOFOL ;  Surgeon: Jerlean Mood, MD;  Location: [facility];  Service: Endoscopy;  Laterality: N/A;   COLONOSCOPY WITH PROPOFOL  N/A 04/02/2023   Procedure: COLONOSCOPY WITH PROPOFOL ;  Surgeon: Selena Daily, MD;  Location: [facility];  Service: Endoscopy;  Laterality: N/A;   ESOPHAGOGASTRODUODENOSCOPY (EGD) WITH PROPOFOL  N/A 11/24/2018   Procedure: ESOPHAGOGASTRODUODENOSCOPY (EGD) WITH PROPOFOL ;  Surgeon: Selena Daily, MD;  Location: [facility];  Service: Gastroenterology;  Laterality: N/A;   Foot sugery Right    3rd toe from the right"    Prior to Admission medications   Medication Sig Start Date End Date Taking? Authorizing Provider  albuterol (VENTOLIN HFA) 108 (90 Base) MCG/ACT inhaler Inhale into the lungs every 6 (six) hours as needed for wheezing or shortness of breath.   Yes [provider]  Atogepant (QULIPTA) 30 MG TABS Take 1 tablet by mouth daily as needed.   Yes Rosan Comfort, MD  Calcium -Magnesium -Vitamin D  300-150-400 MG-MG-UNIT TABS Take by mouth.   Yes [provider]  cholecalciferol (VITAMIN D3) 25 MCG (1000 UT)  tablet Take 1,000 Units by mouth daily.   Yes [provider]  DULoxetine  (CYMBALTA ) 60 MG capsule Take 60 mg by mouth daily.   Yes [provider]  ezetimibe  (ZETIA ) 10 MG tablet TAKE 1 TABLET BY MOUTH EVERY DAY 02/24/23  Yes Sowles, [name], MD  fluticasone -salmeterol (WIXELA INHUB) 100-50 MCG/ACT AEPB INHALE 1 PUFF INTO THE LUNGS TWICE A DAY 12/09/23  Yes Sowles, [name], MD  guaiFENesin -codeine  (ROBITUSSIN AC) 100-10 MG/5ML syrup Take 5 mLs by mouth 3 (three) times daily as needed for cough. 10/11/23  Yes Sowles, [name], MD  linaclotide  (LINZESS ) 145 MCG CAPS capsule Take 1 capsule (145 mcg total) by mouth daily before breakfast. 04/27/24 10/24/24 Yes Arrielle Mcginn, Elson Halon, MD  lipase/protease/amylase (CREON ) 36000 UNITS CPEP capsule Take 1 tablet with the first bite of each and meal and 1 capsule with the first bite of each snack. 12/12/22  Yes Lorinda Copland, Elson Halon, MD  meclizine (ANTIVERT) 25 MG tablet Take 25 mg by mouth 3 (three) times daily as needed for dizziness.   Yes [provider]  meloxicam  (MOBIC ) 15 MG tablet TAKE 1 TABLET BY MOUTH EVERY DAY 03/21/20  Yes Sowles, [name], MD  omeprazole  (PRILOSEC) 20 MG capsule Take 1 capsule (20 mg total) by mouth daily before breakfast. 04/27/24  Yes Rebecah Dangerfield Reddy, MD  QULIPTA 60 MG TABS Take 1 tablet by mouth daily. 07/17/21  Yes Rosan Comfort, MD  rosuvastatin  (CRESTOR ) 5 MG tablet Take 1 tablet (5 mg total) by mouth daily. 02/06/24  Yes Sowles, [name], MD  SUMAtriptan (IMITREX) 100 MG tablet Take as directed PRN migraines 08/04/19  Yes Shah, Hemang K, MD  tiZANidine  (ZANAFLEX ) 2 MG tablet TAKE 1 TABLET (2 MG TOTAL) BY MOUTH 3 (THREE) TIMES DAILY. 08/13/19   [provider]  vitamin B-12 (CYANOCOBALAMIN ) 1000 MCG tablet Take 1,000 mcg by mouth daily. Patient not taking: Reported on 04/27/2024    [provider]    Allergies as of 04/27/2024 - Review Complete 04/27/2024  Allergen Reaction Noted    Diclofenac-misoprostol Other (See Comments) 08/15/2021   Fenofibric acid  04/20/2015   Lipitor [atorvastatin]  04/20/2015   Nsaids Hives 04/20/2015    Family History  Problem Relation Age of Onset   Heart disease Mother    Diabetes Mother    Kidney disease Mother    Hypertension Mother    Heart disease Father    Hyperlipidemia Father    Hypertension Father    Heart disease Other    Cancer Other        Breast   Diabetes Other    Hyperlipidemia Sister    Hypertension Sister    Kidney disease Sister    Diabetes Sister    Lung cancer Paternal Aunt    Cancer Maternal Grandmother        breast    Social History   Socioeconomic History   Marital status: Single    Spouse name: Not on file   Number of children: 2   Years of education: Not on file   Highest education level: Associate degree: academic program  Occupational History    Employer: DISABLED  Tobacco Use   Smoking status: Never   Smokeless tobacco: Never   Tobacco comments:    smoking cessation materials not required  Vaping Use   Vaping status: Never Used  Substance and Sexual Activity   Alcohol use: No    Alcohol/week: 0.0 standard drinks of alcohol   Drug use: No   Sexual activity: Yes    Birth control/protection: Post-menopausal  Other Topics Concern   Not on file  Social History Narrative   Lives at home by herself.   Disable from chronic back pain since 1993   Diagnosed with MS in the 2000's   Social Drivers of Health   Financial Resource Strain: Low Risk  (09/13/2023)   Overall Financial Resource Strain (CARDIA)    Difficulty of Paying Living Expenses: Not hard at all  Food Insecurity: No Food Insecurity (09/13/2023)   Hunger Vital Sign    Worried About Running Out of Food in the Last Year: Never true    Ran Out of Food in the Last Year: Never true  Transportation Needs: No Transportation Needs (09/13/2023)   PRAPARE - Administrator, Civil Service (Medical): No    Lack of  Transportation (Non-Medical): No  Physical Activity: Insufficiently Active (09/13/2023)   Exercise Vital Sign    Days of Exercise per Week: 4 days    Minutes of Exercise per Session: 30 min  Stress: No Stress Concern Present (09/13/2023)   [employer] of Occupational Health - Occupational Stress Questionnaire    Feeling of Stress : Not at all  Social Connections: Unknown (09/13/2023)   Social Connection and Isolation Panel [NHANES]    Frequency of Communication with Friends and Family: Three times a week    Frequency of Social Gatherings with Friends and Family: More than three times a week    Attends Religious Services: Not on file  Active Member of Clubs or Organizations: Yes    Attends Banker Meetings: 1 to 4 times per year    Marital Status: Never married  Intimate Partner Violence: Not At Risk (09/19/2023)   Humiliation, Afraid, Rape, and Kick questionnaire    Fear of Current or Ex-Partner: No    Emotionally Abused: No    Physically Abused: No    Sexually Abused: No    Review of Systems: See HPI, otherwise negative ROS  Physical Exam: BP (!) 140/64   Pulse (!) 57   Temp 97.7 F (36.5 C) (Oral)   Resp 18   Ht 5\' 8"  (1.727 m)   Wt 78.5 kg   SpO2 100%   BMI 26.30 kg/m  General:   Alert,  pleasant and cooperative in NAD Head:  Normocephalic and atraumatic. Neck:  Supple; no masses or thyromegaly. Lungs:  Clear throughout to auscultation.    Heart:  Regular rate and rhythm. Abdomen:  Soft, nontender and nondistended. Normal bowel sounds, without guarding, and without rebound.   Neurologic:  Alert and  oriented x4;  grossly normal neurologically.  Impression/Plan: Ashlee Richardson is here for an endoscopy to be performed for dysphagia  Risks, benefits, limitations, and alternatives regarding  endoscopy have been reviewed with the patient.  Questions have been answered.  All parties agreeable.   Ellis Guys, MD  05/14/2024, 9:11 AM

## 2024-05-14 NOTE — Transfer of Care (Signed)
 Immediate Anesthesia Transfer of Care Note  Patient: Ashlee Richardson  Procedure(s) Performed: EGD (ESOPHAGOGASTRODUODENOSCOPY)  Patient Location: PACU  Anesthesia Type:General  Level of Consciousness: drowsy and patient cooperative  Airway & Oxygen Therapy: Patient Spontanous Breathing  Post-op Assessment: Report given to RN and Post -op Vital signs reviewed and stable  Post vital signs: stable  Last Vitals:  Vitals Value Taken Time  BP    Temp    Pulse 83 05/14/24 1022  Resp 18 05/14/24 1022  SpO2 97 % 05/14/24 1022  Vitals shown include unfiled device data.  Last Pain:  Vitals:   05/14/24 0853  TempSrc: Oral  PainSc: 0-No pain         Complications: No notable events documented.

## 2024-05-14 NOTE — Anesthesia Postprocedure Evaluation (Signed)
 Anesthesia Post Note  Patient: Ashlee Richardson  Procedure(s) Performed: EGD (ESOPHAGOGASTRODUODENOSCOPY)  Patient location during evaluation: Endoscopy Anesthesia Type: General Level of consciousness: awake and alert Pain management: pain level controlled Vital Signs Assessment: post-procedure vital signs reviewed and stable Respiratory status: spontaneous breathing, nonlabored ventilation, respiratory function stable and patient connected to nasal cannula oxygen Cardiovascular status: blood pressure returned to baseline and stable Postop Assessment: no apparent nausea or vomiting Anesthetic complications: no  No notable events documented.   Last Vitals:  Vitals:   05/14/24 1031 05/14/24 1041  BP: (!) 150/74 (!) 171/73  Pulse: 78 79  Resp: 14 14  Temp:    SpO2: 100% 100%    Last Pain:  Vitals:   05/14/24 1041  TempSrc:   PainSc: 0-No pain                 Enrique Harvest

## 2024-05-14 NOTE — Anesthesia Preprocedure Evaluation (Signed)
 Anesthesia Evaluation  Patient identified by MRN, date of birth, ID band Patient awake    Reviewed: Allergy & Precautions, H&P , NPO status , Patient's Chart, lab work & pertinent test results  Airway Mallampati: I  TM Distance: >3 FB Neck ROM: Full    Dental no notable dental hx. (+) Chipped   Pulmonary asthma    Pulmonary exam normal breath sounds clear to auscultation       Cardiovascular negative cardio ROS Normal cardiovascular exam Rhythm:Regular Rate:Normal     Neuro/Psych  Neuromuscular disease  negative psych ROS   GI/Hepatic negative GI ROS, Neg liver ROS, PUD,GERD  Poorly Controlled and Medicated,,  Endo/Other  Pancreatic insufficiency hx Patient denies diabetes mellitus  Renal/GU negative Renal ROS  negative genitourinary   Musculoskeletal   Abdominal   Peds  Hematology negative hematology ROS (+)   Anesthesia Other Findings Past Medical History: 12/27/2017: B12 deficiency No date: Diverticulitis No date: Fibromyalgia No date: GERD (gastroesophageal reflux disease) No date: High cholesterol No date: Migraine No date: MS (multiple sclerosis) (HCC) 04/28/2018: PUD (peptic ulcer disease)  Past Surgical History: 1991?: BACK SURGERY     Comment:  Pt usure of date No date: BREAST BIOPSY; Right 09/2007: COLONOSCOPY 08/28/2017: COLONOSCOPY WITH PROPOFOL ; N/A     Comment:  Procedure: COLONOSCOPY WITH PROPOFOL ;  Surgeon: Jerlean Mood, MD;  Location: [facility];  Service:               Endoscopy;  Laterality: N/A; 04/02/2023: COLONOSCOPY WITH PROPOFOL ; N/A     Comment:  Procedure: COLONOSCOPY WITH PROPOFOL ;  Surgeon: Selena Daily, MD;  Location: [facility];                Service: Endoscopy;  Laterality: N/A; 11/24/2018: ESOPHAGOGASTRODUODENOSCOPY (EGD) WITH PROPOFOL ; N/A     Comment:  Procedure: ESOPHAGOGASTRODUODENOSCOPY (EGD) WITH                PROPOFOL ;  Surgeon: Selena Daily, MD;  Location:               [facility];  Service: Gastroenterology;  Laterality:               N/A; No date: Foot sugery; Right     Comment:  3rd toe from the right"  BMI    Body Mass Index: 26.30 kg/m      Reproductive/Obstetrics negative OB ROS                             Anesthesia Physical Anesthesia Plan  ASA: 2  Anesthesia Plan: General   Post-op Pain Management: Minimal or no pain anticipated   Induction: Intravenous  PONV Risk Score and Plan: 3 and Propofol  infusion, TIVA and Ondansetron  Airway Management Planned: Nasal Cannula  Additional Equipment: None  Intra-op Plan:   Post-operative Plan:   Informed Consent: I have reviewed the patients History and Physical, chart, labs and discussed the procedure including the risks, benefits and alternatives for the proposed anesthesia with the patient or authorized representative who has indicated his/her understanding and acceptance.     Dental advisory given  Plan Discussed with: CRNA and Surgeon  Anesthesia Plan Comments: (Discussed risks of anesthesia with patient, including possibility of difficulty with spontaneous ventilation under anesthesia necessitating airway  intervention, PONV, and rare risks such as cardiac or respiratory or neurological events, and allergic reactions. Discussed the role of CRNA in patient's perioperative care. Patient understands.)       Anesthesia Quick Evaluation

## 2024-05-14 NOTE — Op Note (Signed)
 [facility] Gastroenterology Patient Name: Ashlee Richardson Procedure Date: 05/14/2024 10:07 AM MRN: [id number] Account #: [id number] Date of Birth: [birth year] Admit Type: Outpatient Age: 68 Room: [facility] ENDO ROOM 3 Gender: Female Note Status: Finalized Instrument Name: Cristino Donna Endoscope [id number] Procedure:             Upper GI endoscopy Indications:           Dysphagia Providers:             Selena Daily MD, MD Medicines:             General Anesthesia Complications:         No immediate complications. Estimated blood loss: None. Procedure:             Pre-Anesthesia Assessment:                        - Prior to the procedure, a History and Physical was                         performed, and patient medications and allergies were                         reviewed. The patient is competent. The risks and                         benefits of the procedure and the sedation options and                         risks were discussed with the patient. All questions                         were answered and informed consent was obtained.                         Patient identification and proposed procedure were                         verified by the physician, the nurse, the                         anesthesiologist, the anesthetist and the technician                         in the pre-procedure area in the procedure room in the                         endoscopy suite. Mental Status Examination: alert and                         oriented. Airway Examination: normal oropharyngeal                         airway and neck mobility. Respiratory Examination:                         clear to auscultation. CV Examination: normal.                         Prophylactic Antibiotics: The patient  does not require                         prophylactic antibiotics. Prior Anticoagulants: The                         patient has taken no anticoagulant or antiplatelet                          agents. ASA Grade Assessment: II - A patient with mild                         systemic disease. After reviewing the risks and                         benefits, the patient was deemed in satisfactory                         condition to undergo the procedure. The anesthesia                         plan was to use general anesthesia. Immediately prior                         to administration of medications, the patient was                         re-assessed for adequacy to receive sedatives. The                         heart rate, respiratory rate, oxygen saturations,                         blood pressure, adequacy of pulmonary ventilation, and                         response to care were monitored throughout the                         procedure. The physical status of the patient was                         re-assessed after the procedure.                        After obtaining informed consent, the endoscope was                         passed under direct vision. Throughout the procedure,                         the patient's blood pressure, pulse, and oxygen                         saturations were monitored continuously. The Endoscope                         was introduced through the mouth, and advanced to the  second part of duodenum. The upper GI endoscopy was                         accomplished without difficulty. The patient tolerated                         the procedure well. Findings:      The esophagus was normal.      The stomach was normal.      The examined duodenum was normal. Impression:            - Normal gastroesophageal junction and esophagus.                        - Normal stomach.                        - Normal examined duodenum.                        - No specimens collected. Recommendation:        - Discharge patient to home (with escort).                        - Resume previous diet today.                        - Continue  present medications.                        - Follow an antireflux regimen. Procedure Code(s):     --- Professional ---                        [phone], Esophagogastroduodenoscopy, flexible,                         transoral; diagnostic, including collection of                         specimen(s) by brushing or washing, when performed                         (separate procedure) Diagnosis Code(s):     --- Professional ---                        R13.10, Dysphagia, unspecified CPT copyright 2022 American Medical Association. All rights reserved. The codes documented in this report are preliminary and upon coder review may  be revised to meet current compliance requirements. Dr. Evia Hof Selena Daily MD, MD 05/14/2024 10:19:22 AM This report has been signed electronically. Number of Addenda: 0 Note Initiated On: 05/14/2024 10:07 AM Estimated Blood Loss:  Estimated blood loss: none.      [facility]

## 2024-05-15 ENCOUNTER — Encounter: Payer: Self-pay | Admitting: Gastroenterology

## 2024-05-22 LAB — HM MAMMOGRAPHY

## 2024-08-05 ENCOUNTER — Ambulatory Visit: Admitting: Family Medicine

## 2024-08-05 ENCOUNTER — Encounter: Payer: Self-pay | Admitting: Family Medicine

## 2024-08-05 VITALS — BP 112/66 | HR 72 | Resp 16 | Ht 68.0 in | Wt 171.5 lb

## 2024-08-05 DIAGNOSIS — G35 Multiple sclerosis: Secondary | ICD-10-CM

## 2024-08-05 DIAGNOSIS — E1169 Type 2 diabetes mellitus with other specified complication: Secondary | ICD-10-CM | POA: Diagnosis not present

## 2024-08-05 DIAGNOSIS — I700 Atherosclerosis of aorta: Secondary | ICD-10-CM

## 2024-08-05 DIAGNOSIS — E11628 Type 2 diabetes mellitus with other skin complications: Secondary | ICD-10-CM | POA: Diagnosis not present

## 2024-08-05 DIAGNOSIS — I209 Angina pectoris, unspecified: Secondary | ICD-10-CM | POA: Diagnosis not present

## 2024-08-05 DIAGNOSIS — G43109 Migraine with aura, not intractable, without status migrainosus: Secondary | ICD-10-CM

## 2024-08-05 DIAGNOSIS — M150 Primary generalized (osteo)arthritis: Secondary | ICD-10-CM

## 2024-08-05 DIAGNOSIS — E538 Deficiency of other specified B group vitamins: Secondary | ICD-10-CM

## 2024-08-05 DIAGNOSIS — M797 Fibromyalgia: Secondary | ICD-10-CM

## 2024-08-05 DIAGNOSIS — J4540 Moderate persistent asthma, uncomplicated: Secondary | ICD-10-CM

## 2024-08-05 DIAGNOSIS — F5104 Psychophysiologic insomnia: Secondary | ICD-10-CM

## 2024-08-05 DIAGNOSIS — E559 Vitamin D deficiency, unspecified: Secondary | ICD-10-CM

## 2024-08-05 LAB — POCT GLYCOSYLATED HEMOGLOBIN (HGB A1C): Hemoglobin A1C: 6.4 % — AB (ref 4.0–5.6)

## 2024-08-05 MED ORDER — EZETIMIBE 10 MG PO TABS
10.0000 mg | ORAL_TABLET | Freq: Every day | ORAL | 1 refills | Status: AC
Start: 2024-08-05 — End: ?

## 2024-08-05 MED ORDER — ROSUVASTATIN CALCIUM 5 MG PO TABS: 5.0000 mg | ORAL_TABLET | Freq: Every day | ORAL | 1 refills | Status: AC

## 2024-08-05 MED ORDER — QUETIAPINE FUMARATE 25 MG PO TABS
25.0000 mg | ORAL_TABLET | Freq: Every day | ORAL | 1 refills | Status: AC
Start: 2024-08-05 — End: ?

## 2024-08-05 MED ORDER — MELOXICAM 15 MG PO TABS: 15.0000 mg | ORAL_TABLET | Freq: Every day | ORAL | 0 refills | Status: AC

## 2024-08-05 MED ORDER — DULOXETINE HCL 60 MG PO CPEP: 60.0000 mg | ORAL_CAPSULE | ORAL | 1 refills | Status: AC

## 2024-08-05 NOTE — Progress Notes (Signed)
 Name: Ashlee Richardson   MRN: [id number]    DOB: 1956-09-16   Date:08/05/2024       Progress Note  Subjective  Chief Complaint  Chief Complaint  Patient presents with   Referral    Dr.Patel [facility], pt declined to express why with CMA   HPI   MS: under the care of Dr. Maree , had a positive IG for herpes on spinal tap but was given reassurance by Dr. Epifanio. She denies weakness, but has intermittent tingling an numbness on both arms. MRI showed white plaques. She has some tremors and sometimes decrease in memory, she was given Aricept for her memory but she stopped taking it on her own.  She is still not taking medications for MS.   Chronic back pain and neck pain, also has FMS: she was seeing Dr. Barbarann . She has numbness of right lower extremity that she states it is stable/chronic. She stopped taking Lyrica  and Duloxetine  on her own. Does not like taking medications    GERD and gastritis: seen by Dr. Unk . She was diagnosed with hiatal hernia, she also has a history of constipation, she was given Linzess  but states not sure if taking it at this time.  She was given Creon  enzymes and omeprazole  but does not seem to be compliant with medications at this time.    Insomnia: she states Trazodone  does not seem to work well,and is willing to try seroquel  now     RLS: taking Requip  from neurologist, she states she continues to have cramps, she stopped taking Lyrica     DM: A1C was 6.6 % back in September 2021, it is currently controlled by diet only. A1C was 6.4 % today  . She denies polyphagia, polydipsia but she has urinary frequency. She is back on low dose statin and taking Zetia . She is avoiding  sweets, sodas and cutting down on bread .     Angina Pectoris: seen by Dr. Florencio, she has been compliant with crestor  and zetia  now. She continues to have intermittent chest pain but states better controlled lately    Atherosclerosis aorta: she is taking Crestor  5 mg and Zetia  10  mg, at this time denies side effects   Migraine headaches: she sees neurologist , Dr. Maree, she  is now on Qulipta but only takes it prn, she states episodes are getting worse again, back to a few times a week . She will follow up with Dr. Maree    B12/Vitamin D  deficiency: she is not taking any supplements at this time    Asthma: she has not been using Advair lately, states has nocturnal cough and wheezing.   Osteoarthritis: seen by Dr. Tobie last year and diagnosed with OA of both thumbs and given steroid injections. She states pain is getting worse again, specially on left thumb and wants to go back. Explained likely better to see Ortho and she agreed   Patient Active Problem List   Diagnosis Date Noted   Dysphagia 05/14/2024   Reactive airway disease 10/11/2023   Type 2 diabetes mellitus with pressure callus (HCC) 03/20/2021   Diabetes mellitus type 2, diet-controlled (HCC) 03/20/2021   Onychomycosis of multiple toenails with type 2 diabetes mellitus (HCC) 03/20/2021   Chronic idiopathic constipation 03/20/2021   Lumbar foraminal stenosis 03/08/2021   Myalgia due to statin 05/26/2020   History of lumbar fusion 11/22/2019   GERD (gastroesophageal reflux disease) 04/28/2018   Pure hypercholesterolemia 04/28/2018   Diverticulosis 04/28/2018   RLS (  restless legs syndrome) 04/28/2018   Angina pectoris (HCC) 04/28/2018   Vitamin D  deficiency 12/27/2017   Chronic fatigue, unspecified 12/27/2017   Intractable migraine without aura and without status migrainosus 10/17/2015   DDD (degenerative disc disease), cervical 05/23/2015   Bilateral occipital neuralgia 05/23/2015   DDD (degenerative disc disease), lumbar 05/23/2015   Multiple sclerosis (HCC) 05/23/2015   Cervical disc disorder with radiculopathy of cervical region 04/20/2015   Right arm numbness 04/20/2015    Past Surgical History:  Procedure Laterality Date   BACK SURGERY  1991?   Pt usure of date   BREAST BIOPSY Right     COLONOSCOPY  09/2007   COLONOSCOPY WITH PROPOFOL  N/A 08/28/2017   Procedure: COLONOSCOPY WITH PROPOFOL ;  Surgeon: Dellie Louanne MATSU, MD;  Location: [facility] END[facility];  Service: Endoscopy;  Laterality: N/A;   COLONOSCOPY WITH PROPOFOL  N/A 04/02/2023   Procedure: COLONOSCOPY WITH PROPOFOL ;  Surgeon: Unk Corinn Skiff, MD;  Location: [facility];  Service: Endoscopy;  Laterality: N/A;   ESOPHAGOGASTRODUODENOSCOPY N/A 05/14/2024   Procedure: EGD (ESOPHAGOGASTRODUODENOSCOPY);  Surgeon: Unk Corinn Skiff, MD;  Location: [facility];  Service: Gastroenterology;  Laterality: N/A;   ESOPHAGOGASTRODUODENOSCOPY (EGD) WITH PROPOFOL  N/A 11/24/2018   Procedure: ESOPHAGOGASTRODUODENOSCOPY (EGD) WITH PROPOFOL ;  Surgeon: Unk Corinn Skiff, MD;  Location: [facility] END[facility];  Service: Gastroenterology;  Laterality: N/A;   Foot sugery Right    3rd toe from the right    Family History  Problem Relation Age of Onset   Heart disease Mother    Diabetes Mother    Kidney disease Mother    Hypertension Mother    Heart disease Father    Hyperlipidemia Father    Hypertension Father    Heart disease Other    Cancer Other        Breast   Diabetes Other    Hyperlipidemia Sister    Hypertension Sister    Kidney disease Sister    Diabetes Sister    Lung cancer Paternal Aunt    Cancer Maternal Grandmother        breast    Social History   Tobacco Use   Smoking status: Never   Smokeless tobacco: Never   Tobacco comments:    smoking cessation materials not required  Substance Use Topics   Alcohol use: No    Alcohol/week: 0.0 standard drinks of alcohol     Current Outpatient Medications:    albuterol  (VENTOLIN  HFA) 108 (90 Base) MCG/ACT inhaler, Inhale into the lungs every 6 (six) hours as needed for wheezing or shortness of breath., Disp: , Rfl:    Atogepant (QULIPTA) 30 MG TABS, Take 1 tablet by mouth daily as needed., Disp: , Rfl:    Calcium -Magnesium -Vitamin D  300-150-400 MG-MG-UNIT TABS,  Take by mouth., Disp: , Rfl:    cholecalciferol (VITAMIN D3) 25 MCG (1000 UT) tablet, Take 1,000 Units by mouth daily., Disp: , Rfl:    DULoxetine  (CYMBALTA ) 60 MG capsule, Take 60 mg by mouth daily., Disp: , Rfl:    ezetimibe  (ZETIA ) 10 MG tablet, TAKE 1 TABLET BY MOUTH EVERY DAY, Disp: 90 tablet, Rfl: 0   fluticasone -salmeterol (WIXELA INHUB) 100-50 MCG/ACT AEPB, INHALE 1 PUFF INTO THE LUNGS TWICE A DAY, Disp: 60 each, Rfl: 1   guaiFENesin -codeine  (ROBITUSSIN AC) 100-10 MG/5ML syrup, Take 5 mLs by mouth 3 (three) times daily as needed for cough., Disp: 120 mL, Rfl: 0   linaclotide  (LINZESS ) 145 MCG CAPS capsule, Take 1 capsule (145 mcg total) by mouth daily before breakfast., Disp: 30  capsule, Rfl: 2   lipase/protease/amylase (CREON ) 36000 UNITS CPEP capsule, Take 1 tablet with the first bite of each and meal and 1 capsule with the first bite of each snack., Disp: 180 capsule, Rfl: 2   meclizine (ANTIVERT) 25 MG tablet, Take 25 mg by mouth 3 (three) times daily as needed for dizziness., Disp: , Rfl:    meloxicam  (MOBIC ) 15 MG tablet, TAKE 1 TABLET BY MOUTH EVERY DAY, Disp: 90 tablet, Rfl: 0   omeprazole  (PRILOSEC) 20 MG capsule, Take 1 capsule (20 mg total) by mouth daily before breakfast., Disp: 30 capsule, Rfl: 2   QULIPTA 60 MG TABS, Take 1 tablet by mouth daily., Disp: , Rfl:    rosuvastatin  (CRESTOR ) 5 MG tablet, Take 1 tablet (5 mg total) by mouth daily., Disp: 90 tablet, Rfl: 1   SUMAtriptan (IMITREX) 100 MG tablet, Take as directed PRN migraines, Disp: , Rfl:    tiZANidine  (ZANAFLEX ) 2 MG tablet, TAKE 1 TABLET (2 MG TOTAL) BY MOUTH 3 (THREE) TIMES DAILY., Disp: , Rfl:    vitamin B-12 (CYANOCOBALAMIN ) 1000 MCG tablet, Take 1,000 mcg by mouth daily. (Patient not taking: Reported on 08/05/2024), Disp: , Rfl:   Allergies  Allergen Reactions   Diclofenac-Misoprostol Other (See Comments)    Heart racing   Fenofibric Acid     Pt doesn't remember reaction   Lipitor [Atorvastatin]     Chest  pain   Nsaids Hives    I personally reviewed active problem list, medication list, allergies, family history with the patient/caregiver today.   ROS  Ten systems reviewed and is negative except as mentioned in HPI    Objective Physical Exam   Vitals:   08/05/24 1129  BP: 112/66  Pulse: 72  Resp: 16  SpO2: 99%  Weight: 171 lb 8 oz (77.8 kg)  Height: 5' 8 (1.727 m)    Body mass index is 26.08 kg/m.  Recent Results (from the past 2160 hours)  HM MAMMOGRAPHY     Status: None   Collection Time: 05/22/24 12:00 AM  Result Value Ref Range   HM Mammogram 0-4 Bi-Rad 0-4 Bi-Rad, Self Reported Normal    Comment: [facility]  POCT glycosylated hemoglobin (Hb A1C)     Status: Abnormal   Collection Time: 08/05/24 11:33 AM  Result Value Ref Range   Hemoglobin A1C 6.4 (A) 4.0 - 5.6 %   HbA1c POC (<> result, manual entry)     HbA1c, POC (prediabetic range)     HbA1c, POC (controlled diabetic range)      Diabetic Foot Exam:     PHQ2/9:    08/05/2024   11:20 AM 10/11/2023   10:37 AM 09/19/2023    8:39 AM 08/30/2023    8:21 AM 02/28/2023    7:49 AM  Depression screen PHQ 2/9  Decreased Interest 0 0 0 0 0  Down, Depressed, Hopeless 0 0 0 0 0  PHQ - 2 Score 0 0 0 0 0  Altered sleeping  3  0 0  Tired, decreased energy  3  1 3   Change in appetite  2  1 0  Feeling bad or failure about yourself   0  0 0  Trouble concentrating  0  0 0  Moving slowly or fidgety/restless  0  0 0  Suicidal thoughts  0  0 0  PHQ-9 Score  8  2 3   Difficult doing work/chores  Very difficult  Not difficult at all     phq 9 is negative  Fall Risk:    08/05/2024   11:20 AM 10/11/2023   10:37 AM 09/13/2023    1:02 PM 08/30/2023    8:21 AM 02/28/2023    7:49 AM  Fall Risk   Falls in the past year? 1 0 0 0 0  Number falls in past yr: 0    0  Injury with Fall? 0    0  Risk for fall due to : Impaired balance/gait No Fall Risks No Fall Risks No Fall Risks No Fall Risks  Follow up Falls evaluation completed  Falls prevention discussed Education provided;Falls prevention discussed Falls prevention discussed Falls prevention discussed      Assessment & Plan  1. Type 2 diabetes mellitus with pressure callus (HCC) (Primary)  - POCT glycosylated hemoglobin (Hb A1C) - Urine Microalbumin w/creat. ratio - Comprehensive metabolic panel with GFR - keep visits with podiatrist   2. MS (multiple sclerosis) (HCC)  Under the care of neurologist   3. Atherosclerosis of aorta (HCC)  - Lipid panel - ezetimibe  (ZETIA ) 10 MG tablet; Take 1 tablet (10 mg total) by mouth daily.  Dispense: 90 tablet; Refill: 1 - rosuvastatin  (CRESTOR ) 5 MG tablet; Take 1 tablet (5 mg total) by mouth daily.  Dispense: 90 tablet; Refill: 1  4. Dyslipidemia associated with type 2 diabetes mellitus (HCC)  - rosuvastatin  (CRESTOR ) 5 MG tablet; Take 1 tablet (5 mg total) by mouth daily.  Dispense: 90 tablet; Refill: 1  5. Angina pectoris (HCC)  - ezetimibe  (ZETIA ) 10 MG tablet; Take 1 tablet (10 mg total) by mouth daily.  Dispense: 90 tablet; Refill: 1  6. Migraine aura without headache  Follow up with Dr. Maree   7. B12 deficiency  Continue supplements   8. Vitamin D  deficiency  Continue supplementation   9. Primary osteoarthritis involving multiple joints  - Ambulatory referral to Orthopedic Surgery - meloxicam  (MOBIC ) 15 MG tablet; Take 1 tablet (15 mg total) by mouth daily.  Dispense: 90 tablet; Refill: 0  10. Moderate persistent asthma without complication  Must resume Advair daily   11. Insomnia, psychophysiological  - QUEtiapine  (SEROQUEL ) 25 MG tablet; Take 1 tablet (25 mg total) by mouth at bedtime.  Dispense: 90 tablet; Refill: 1  12. Fibromyalgia  She is willing to resume Duloxetine  for pain

## 2024-08-06 ENCOUNTER — Ambulatory Visit: Payer: Self-pay | Admitting: Family Medicine

## 2024-08-06 LAB — COMPREHENSIVE METABOLIC PANEL WITH GFR
AG Ratio: 2 (calc) (ref 1.0–2.5)
ALT: 14 U/L (ref 6–29)
AST: 14 U/L (ref 10–35)
Albumin: 4.9 g/dL (ref 3.6–5.1)
Alkaline phosphatase (APISO): 66 U/L (ref 37–153)
BUN: 8 mg/dL (ref 7–25)
CO2: 29 mmol/L (ref 20–32)
Calcium: 10.3 mg/dL (ref 8.6–10.4)
Chloride: 103 mmol/L (ref 98–110)
Creat: 0.74 mg/dL (ref 0.50–1.05)
Globulin: 2.4 g/dL (ref 1.9–3.7)
Glucose, Bld: 88 mg/dL (ref 65–99)
Potassium: 5 mmol/L (ref 3.5–5.3)
Sodium: 141 mmol/L (ref 135–146)
Total Bilirubin: 0.7 mg/dL (ref 0.2–1.2)
Total Protein: 7.3 g/dL (ref 6.1–8.1)
eGFR: 89 mL/min/1.73m2 (ref >=60)

## 2024-08-06 LAB — LIPID PANEL
Cholesterol: 324 mg/dL — ABNORMAL HIGH (ref <200)
HDL: 81 mg/dL (ref >=50)
LDL Cholesterol (Calc): 212 mg/dL — ABNORMAL HIGH
Non-HDL Cholesterol (Calc): 243 mg/dL — ABNORMAL HIGH (ref <130)
Total CHOL/HDL Ratio: 4 (calc) (ref <5.0)
Triglycerides: 150 mg/dL — ABNORMAL HIGH (ref <150)

## 2024-08-06 LAB — MICROALBUMIN / CREATININE URINE RATIO
Creatinine, Urine: 122 mg/dL (ref 20–275)
Microalb Creat Ratio: 2 mg/g{creat} (ref <30)
Microalb, Ur: 0.3 mg/dL

## 2024-08-17 ENCOUNTER — Encounter: Payer: Self-pay | Admitting: Family Medicine

## 2024-08-17 ENCOUNTER — Ambulatory Visit (INDEPENDENT_AMBULATORY_CARE_PROVIDER_SITE_OTHER): Admitting: Family Medicine

## 2024-08-17 VITALS — BP 146/62 | HR 68 | Resp 16 | Ht 68.0 in | Wt 171.3 lb

## 2024-08-17 DIAGNOSIS — G2581 Restless legs syndrome: Secondary | ICD-10-CM | POA: Diagnosis not present

## 2024-08-17 DIAGNOSIS — Z8249 Family history of ischemic heart disease and other diseases of the circulatory system: Secondary | ICD-10-CM | POA: Diagnosis not present

## 2024-08-17 DIAGNOSIS — I209 Angina pectoris, unspecified: Secondary | ICD-10-CM | POA: Diagnosis not present

## 2024-08-17 DIAGNOSIS — I700 Atherosclerosis of aorta: Secondary | ICD-10-CM | POA: Diagnosis not present

## 2024-08-17 DIAGNOSIS — N898 Other specified noninflammatory disorders of vagina: Secondary | ICD-10-CM

## 2024-08-17 MED ORDER — FLUCONAZOLE 150 MG PO TABS: 150.0000 mg | ORAL_TABLET | ORAL | 0 refills | Status: AC

## 2024-08-17 MED ORDER — ROPINIROLE HCL 0.5 MG PO TABS: 0.5000 mg | ORAL_TABLET | Freq: Every day | ORAL | 0 refills | Status: DC

## 2024-08-17 NOTE — Progress Notes (Signed)
 Name: Ashlee Richardson   MRN: [id number]    DOB: 1956-03-09   Date:08/17/2024       Progress Note  Subjective  Chief Complaint  Chief Complaint  Patient presents with   Results    Discuss Cholesterol med due to abnormal labs   HPI   Dyslipidemia/Angina Pectoris/Atherosclerosis of Aorta:  she has intermittent chest heaviness, LDL was down to 114 but she started to skip doses of statin and Zetia  and last LDL spiked to 212 . Significant family history of heart disease with CABG such as mother, father, grandparents. She states she started to skip doses due to leg cramps that happens at night and improves when walking. She has a history of RLS explained that may be the cause. She is willing to take Requip  again and see if cramps will improve and take zetia  and crestor  daily   She had COVID vaccine given last week and states she felt weak unable to walk and hard to breath, she went back to pharmacy. She is feeling better now. She is not sure if the pharmacy reported . Advised to go back to pharmacy to report as a side effects  Vaginal itching and discharge: going on for the past couple of weeks, itchy and white discharge, she has taken diflucan  in the past without problems. She is allergic to diclofenac / misoprostol and it gave a warning, discussed topical medication but she states she prefers the oral medication   Patient Active Problem List   Diagnosis Date Noted   Atherosclerosis of aorta (HCC) 08/17/2024   Dysphagia 05/14/2024   Reactive airway disease 10/11/2023   Type 2 diabetes mellitus with pressure callus (HCC) 03/20/2021   Diabetes mellitus type 2, diet-controlled (HCC) 03/20/2021   Onychomycosis of multiple toenails with type 2 diabetes mellitus (HCC) 03/20/2021   Chronic idiopathic constipation 03/20/2021   Lumbar foraminal stenosis 03/08/2021   Myalgia due to statin 05/26/2020   History of lumbar fusion 11/22/2019   GERD (gastroesophageal reflux disease) 04/28/2018   Pure  hypercholesterolemia 04/28/2018   Diverticulosis 04/28/2018   RLS (restless legs syndrome) 04/28/2018   Angina pectoris (HCC) 04/28/2018   Vitamin D  deficiency 12/27/2017   Chronic fatigue, unspecified 12/27/2017   Intractable migraine without aura and without status migrainosus 10/17/2015   DDD (degenerative disc disease), cervical 05/23/2015   Bilateral occipital neuralgia 05/23/2015   DDD (degenerative disc disease), lumbar 05/23/2015   Multiple sclerosis (HCC) 05/23/2015   Cervical disc disorder with radiculopathy of cervical region 04/20/2015   Right arm numbness 04/20/2015    Past Surgical History:  Procedure Laterality Date   BACK SURGERY  1991?   Pt usure of date   BREAST BIOPSY Right    COLONOSCOPY  09/2007   COLONOSCOPY WITH PROPOFOL  N/A 08/28/2017   Procedure: COLONOSCOPY WITH PROPOFOL ;  Surgeon: Dellie Louanne MATSU, MD;  Location: [facility] END[facility];  Service: Endoscopy;  Laterality: N/A;   COLONOSCOPY WITH PROPOFOL  N/A 04/02/2023   Procedure: COLONOSCOPY WITH PROPOFOL ;  Surgeon: Unk Corinn Skiff, MD;  Location: [facility];  Service: Endoscopy;  Laterality: N/A;   ESOPHAGOGASTRODUODENOSCOPY N/A 05/14/2024   Procedure: EGD (ESOPHAGOGASTRODUODENOSCOPY);  Surgeon: Unk Corinn Skiff, MD;  Location: [facility] END[facility]COPY;  Service: Gastroenterology;  Laterality: N/A;   ESOPHAGOGASTRODUODENOSCOPY (EGD) WITH PROPOFOL  N/A 11/24/2018   Procedure: ESOPHAGOGASTRODUODENOSCOPY (EGD) WITH PROPOFOL ;  Surgeon: Unk Corinn Skiff, MD;  Location: [facility] ENDOSCOPY;  Service: Gastroenterology;  Laterality: N/A;   Foot sugery Right    3rd toe from the right  Family History  Problem Relation Age of Onset   Heart disease Mother    Diabetes Mother    Kidney disease Mother    Hypertension Mother    Heart disease Father    Hyperlipidemia Father    Hypertension Father    Heart disease Other    Cancer Other        Breast   Diabetes Other    Hyperlipidemia Sister    Hypertension  Sister    Kidney disease Sister    Diabetes Sister    Lung cancer Paternal Aunt    Cancer Maternal Grandmother        breast    Social History   Tobacco Use   Smoking status: Never   Smokeless tobacco: Never   Tobacco comments:    smoking cessation materials not required  Substance Use Topics   Alcohol use: No    Alcohol/week: 0.0 standard drinks of alcohol     Current Outpatient Medications:    albuterol  (VENTOLIN  HFA) 108 (90 Base) MCG/ACT inhaler, Inhale into the lungs every 6 (six) hours as needed for wheezing or shortness of breath., Disp: , Rfl:    Atogepant (QULIPTA) 30 MG TABS, Take 1 tablet by mouth daily as needed., Disp: , Rfl:    Calcium -Magnesium -Vitamin D  300-150-400 MG-MG-UNIT TABS, Take by mouth., Disp: , Rfl:    cholecalciferol (VITAMIN D3) 25 MCG (1000 UT) tablet, Take 1,000 Units by mouth daily., Disp: , Rfl:    DULoxetine  (CYMBALTA ) 60 MG capsule, Take 1 capsule (60 mg total) by mouth every morning., Disp: 90 capsule, Rfl: 1   ezetimibe  (ZETIA ) 10 MG tablet, Take 1 tablet (10 mg total) by mouth daily., Disp: 90 tablet, Rfl: 1   fluticasone -salmeterol (WIXELA INHUB) 100-50 MCG/ACT AEPB, INHALE 1 PUFF INTO THE LUNGS TWICE A DAY, Disp: 60 each, Rfl: 1   linaclotide  (LINZESS ) 145 MCG CAPS capsule, Take 1 capsule (145 mcg total) by mouth daily before breakfast., Disp: 30 capsule, Rfl: 2   lipase/protease/amylase (CREON ) 36000 UNITS CPEP capsule, Take 1 tablet with the first bite of each and meal and 1 capsule with the first bite of each snack., Disp: 180 capsule, Rfl: 2   meclizine (ANTIVERT) 25 MG tablet, Take 25 mg by mouth 3 (three) times daily as needed for dizziness., Disp: , Rfl:    meloxicam  (MOBIC ) 15 MG tablet, Take 1 tablet (15 mg total) by mouth daily., Disp: 90 tablet, Rfl: 0   omeprazole  (PRILOSEC) 20 MG capsule, Take 1 capsule (20 mg total) by mouth daily before breakfast., Disp: 30 capsule, Rfl: 2   QUEtiapine  (SEROQUEL ) 25 MG tablet, Take 1 tablet (25  mg total) by mouth at bedtime., Disp: 90 tablet, Rfl: 1   QULIPTA 60 MG TABS, Take 1 tablet by mouth daily., Disp: , Rfl:    rOPINIRole  (REQUIP ) 0.5 MG tablet, Take 1 tablet (0.5 mg total) by mouth at bedtime., Disp: 90 tablet, Rfl: 0   rosuvastatin  (CRESTOR ) 5 MG tablet, Take 1 tablet (5 mg total) by mouth daily., Disp: 90 tablet, Rfl: 1   SUMAtriptan (IMITREX) 100 MG tablet, Take as directed PRN migraines, Disp: , Rfl:    tiZANidine  (ZANAFLEX ) 2 MG tablet, TAKE 1 TABLET (2 MG TOTAL) BY MOUTH 3 (THREE) TIMES DAILY., Disp: , Rfl:    vitamin B-12 (CYANOCOBALAMIN ) 1000 MCG tablet, Take 1,000 mcg by mouth daily., Disp: , Rfl:   Allergies  Allergen Reactions   Diclofenac-Misoprostol Other (See Comments)    Heart racing   Fenofibric  Acid     Pt doesn't remember reaction   Lipitor [Atorvastatin]     Chest pain   Nsaids Hives    I personally reviewed active problem list, medication list, allergies with the patient/caregiver today.   ROS  Ten systems reviewed and is negative except as mentioned in HPI    Objective Physical Exam Constitutional: Patient appears well-developed and well-nourished.  No distress.  HEENT: head atraumatic, normocephalic, pupils equal and reactive to light, neck supple Cardiovascular: Normal rate, regular rhythm and normal heart sounds.  No murmur heard. No BLE edema. Pulmonary/Chest: Effort normal and breath sounds normal. No respiratory distress. Abdominal: Soft.  There is no tenderness. Psychiatric: Patient has a normal mood and affect. behavior is normal. Judgment and thought content normal.   Vitals:   08/17/24 1436  BP: (!) 148/66  Pulse: 68  Resp: 16  SpO2: 100%  Weight: 171 lb 4.8 oz (77.7 kg)  Height: 5' 8 (1.727 m)    Body mass index is 26.05 kg/m.  Recent Results (from the past 2160 hours)  HM MAMMOGRAPHY     Status: None   Collection Time: 05/22/24 12:00 AM  Result Value Ref Range   HM Mammogram 0-4 Bi-Rad 0-4 Bi-Rad, Self Reported  Normal    Comment: [facility]  POCT glycosylated hemoglobin (Hb A1C)     Status: Abnormal   Collection Time: 08/05/24 11:33 AM  Result Value Ref Range   Hemoglobin A1C 6.4 (A) 4.0 - 5.6 %   HbA1c POC (<> result, manual entry)     HbA1c, POC (prediabetic range)     HbA1c, POC (controlled diabetic range)    Urine Microalbumin w/creat. ratio     Status: None   Collection Time: 08/05/24 12:20 PM  Result Value Ref Range   Creatinine, Urine 122 20 - 275 mg/dL   Microalb, Ur 0.3 mg/dL    Comment: Reference Range Not established    Microalb Creat Ratio 2 <30 mg/g creat    Comment: . The ADA defines abnormalities in albumin excretion as follows: SABRA Albuminuria Category        Result (mg/g creatinine) . Normal to Mildly increased   <30 Moderately increased         30-299  Severely increased           > OR = 300 . The ADA recommends that at least two of three specimens collected within a 3-6 month period be abnormal before considering a patient to be within a diagnostic category.   Comprehensive metabolic panel with GFR     Status: None   Collection Time: 08/05/24 12:20 PM  Result Value Ref Range   Glucose, Bld 88 65 - 99 mg/dL    Comment: .            Fasting reference interval .    BUN 8 7 - 25 mg/dL   Creat 9.25 9.49 - 8.94 mg/dL   eGFR 89 > OR = 60 fO/fpw/8.26f7   BUN/Creatinine Ratio SEE NOTE: 6 - 22 (calc)    Comment:    Not Reported: BUN and Creatinine are within    reference range. .    Sodium 141 135 - 146 mmol/L   Potassium 5.0 3.5 - 5.3 mmol/L   Chloride 103 98 - 110 mmol/L   CO2 29 20 - 32 mmol/L   Calcium  10.3 8.6 - 10.4 mg/dL   Total Protein 7.3 6.1 - 8.1 g/dL   Albumin 4.9 3.6 - 5.1 g/dL   Globulin  2.4 1.9 - 3.7 g/dL (calc)   AG Ratio 2.0 1.0 - 2.5 (calc)   Total Bilirubin 0.7 0.2 - 1.2 mg/dL   Alkaline phosphatase (APISO) 66 37 - 153 U/L   AST 14 10 - 35 U/L   ALT 14 6 - 29 U/L  Lipid panel     Status: Abnormal   Collection Time: 08/05/24 12:20 PM  Result  Value Ref Range   Cholesterol 324 (H) <200 mg/dL   HDL 81 > OR = 50 mg/dL   Triglycerides 849 (H) <150 mg/dL   LDL Cholesterol (Calc) 212 (H) mg/dL (calc)    Comment: LDL-C levels > or = 190 mg/dL may indicate familial  hypercholesterolemia (FH). Clinical assessment and  measurement of blood lipid levels should be  considered for all first degree relatives of patients with an FH diagnosis. LDL Cholesterol (LDL-C) levels > or = 300 mg/dL may indicate homozygous familial hypercholesterolemia (HoFH). Untreated,  these extremely high LDL-C levels can result in premature CV events and mortality. Patients should be identified early and provided appropriate interventions to reduce the cumulative LDL-C burden from birth. . For questions about testing for familial hypercholesterolemia, please call Engineer, materials at 1.866.GENE.INFO. SABRA Veatrice DASEN, et al. J National Lipid Association  Recommendations for Patient-Centered Management of Dyslipidemia: Part 1 Journal of  Clinical Lipidology 2015;9(2), 129-169. Cuchel, M. et al. (2014). Homozygous familial hypercholesterolaemia: new insights and guidance for clinicians to impro ve detection and clinical management. European Heart Journal, 35(32), [phone]. Reference range: <100 . Desirable range <100 mg/dL for primary prevention;   <70 mg/dL for patients with CHD or diabetic patients  with > or = 2 CHD risk factors. SABRA LDL-C is now calculated using the Martin-Hopkins  calculation, which is a validated novel method providing  better accuracy than the Friedewald equation in the  estimation of LDL-C.  Gladis APPLETHWAITE et al. SANDREA. 7986;689(80): 2061-2068  (http://education.[facility].com/faq/FAQ164)    Total CHOL/HDL Ratio 4.0 <5.0 (calc)   Non-HDL Cholesterol (Calc) 243 (H) <130 mg/dL (calc)    Comment: Non-HDL level > or = 220 is very high and may indicate  genetic familial hypercholesterolemia (FH). Clinical  assessment  and measurement of blood lipid levels  should be considered for all first-degree relatives  of patients with an FH diagnosis. . For patients with diabetes plus 1 major ASCVD risk  factor, treating to a non-HDL-C goal of <100 mg/dL  (LDL-C of <29 mg/dL) is considered a therapeutic  option.      PHQ2/9:    08/17/2024    2:31 PM 08/05/2024   11:20 AM 10/11/2023   10:37 AM 09/19/2023    8:39 AM 08/30/2023    8:21 AM  Depression screen PHQ 2/9  Decreased Interest 0 0 0 0 0  Down, Depressed, Hopeless 0 0 0 0 0  PHQ - 2 Score 0 0 0 0 0  Altered sleeping   3  0  Tired, decreased energy   3  1  Change in appetite   2  1  Feeling bad or failure about yourself    0  0  Trouble concentrating   0  0  Moving slowly or fidgety/restless   0  0  Suicidal thoughts   0  0  PHQ-9 Score   8  2  Difficult doing work/chores   Very difficult  Not difficult at all    phq 9 is negative  Fall Risk:    08/17/2024    2:31 PM  08/05/2024   11:20 AM 10/11/2023   10:37 AM 09/13/2023    1:02 PM 08/30/2023    8:21 AM  Fall Risk   Falls in the past year? 1 1 0 0 0  Number falls in past yr: 0 0     Injury with Fall? 0 0     Risk for fall due to : Impaired balance/gait Impaired balance/gait No Fall Risks No Fall Risks No Fall Risks  Follow up Falls evaluation completed Falls evaluation completed Falls prevention discussed Education provided;Falls prevention discussed Falls prevention discussed     Assessment & Plan  1. Angina pectoris (HCC) (Primary)  Still has symptoms , under the care of cardiologist   2. Atherosclerosis of aorta (HCC)  Resume Statin therapy and Zetia  daily and recheck next visit   3. RLS (restless legs syndrome)  - rOPINIRole  (REQUIP ) 0.5 MG tablet; Take 1 tablet (0.5 mg total) by mouth at bedtime.  Dispense: 90 tablet; Refill: 0  4. Family history of heart disease   Continue medication   5. Vaginal itching  - fluconazole  (DIFLUCAN ) 150 MG tablet; Take 1 tablet (150 mg  total) by mouth every other day.  Dispense: 3 tablet; Refill: 0

## 2024-10-12 ENCOUNTER — Other Ambulatory Visit: Payer: Self-pay

## 2024-10-12 LAB — OPHTHALMOLOGY REPORT-SCANNED

## 2024-10-22 ENCOUNTER — Ambulatory Visit

## 2024-10-22 DIAGNOSIS — Z0000 Encounter for general adult medical examination without abnormal findings: Secondary | ICD-10-CM

## 2024-10-22 NOTE — Patient Instructions (Addendum)
 Ms. Eddleman,  Thank you for taking the time for your Medicare Wellness Visit. I appreciate your continued commitment to your health goals. Please review the care plan we discussed, and feel free to reach out if I can assist you further.  Medicare recommends these wellness visits once per year to help you and your care team stay ahead of potential health issues. These visits are designed to focus on prevention, allowing your provider to concentrate on managing your acute and chronic conditions during your regular appointments.  Please note that Annual Wellness Visits do not include a physical exam. Some assessments may be limited, especially if the visit was conducted virtually. If needed, we may recommend a separate in-person follow-up with your provider.  Ongoing Care Seeing your primary care provider every 3 to 6 months helps us  monitor your health and provide consistent, personalized care.   Referrals If a referral was made during today's visit and you haven't received any updates within two weeks, please contact the referred provider directly to check on the status.  Recommended Screenings:  Health Maintenance  Topic Date Due   Complete foot exam   08/29/2024   COVID-19 Vaccine (9 - 2025-26 season) 10/02/2024   Hemoglobin A1C  02/05/2025   Breast Cancer Screening  05/22/2025   Yearly kidney function blood test for diabetes  08/05/2025   Yearly kidney health urinalysis for diabetes  08/05/2025   Eye exam for diabetics  10/12/2025   Medicare Annual Wellness Visit  10/22/2025   DTaP/Tdap/Td vaccine (4 - Td or Tdap) 12/24/2025   DEXA scan (bone density measurement)  09/18/2027   Colon Cancer Screening  04/01/2033   Pneumococcal Vaccine for age over 68  Completed   Flu Shot  Completed   Hepatitis C Screening  Completed   Zoster (Shingles) Vaccine  Completed   Meningitis B Vaccine  Aged Out     Advance Care Planning is important because it: Ensures you receive medical care that  aligns with your values, goals, and preferences. Provides guidance to your family and loved ones, reducing the emotional burden of decision-making during critical moments.  Vision: Annual vision screenings are recommended for early detection of glaucoma, cataracts, and diabetic retinopathy. These exams can also reveal signs of chronic conditions such as diabetes and high blood pressure.  Dental: Annual dental screenings help detect early signs of oral cancer, gum disease, and other conditions linked to overall health, including heart disease and diabetes.  Please see the attached documents for additional preventive care recommendations.   NEXT AWV 10/28/25 @ 8:10 AM BY PHONE

## 2024-10-22 NOTE — Progress Notes (Signed)
 Subjective:   Ashlee Richardson is a 68 y.o. who presents for a Medicare Wellness preventive visit.  As a reminder, Annual Wellness Visits don't include a physical exam, and some assessments may be limited, especially if this visit is performed virtually. We may recommend an in-person follow-up visit with your provider if needed.  Visit Complete: Virtual I connected with  Ashlee Richardson on 10/22/24 by a audio enabled telemedicine application and verified that I am speaking with the correct person using two identifiers.  Patient Location: Home  Provider Location: Home Office  I discussed the limitations of evaluation and management by telemedicine. The patient expressed understanding and agreed to proceed.  Vital Signs: Because this visit was a virtual/telehealth visit, some criteria may be missing or patient reported. Any vitals not documented were not able to be obtained and vitals that have been documented are patient reported.  VideoDeclined- This patient declined Librarian, academic. Therefore the visit was completed with audio only.  Persons Participating in Visit: Patient.  AWV Questionnaire: No: Patient Medicare AWV questionnaire was not completed prior to this visit.  Cardiac Risk Factors include: advanced age (>33men, >62 women);diabetes mellitus;dyslipidemia     Objective:    Today's Vitals   10/22/24 0828  PainSc: 3    There is no height or weight on file to calculate BMI.     10/22/2024    8:33 AM 05/14/2024    8:52 AM 09/19/2023    8:20 AM 04/02/2023    9:38 AM 08/15/2021    2:28 PM 07/24/2021   11:19 AM 03/01/2021    9:07 AM  Advanced Directives  Does Patient Have a Medical Advance Directive? No No No No No No No  Would patient like information on creating a medical advance directive? No - Patient declined No - Patient declined  No - Patient declined No - Patient declined No - Patient declined     Current Medications  (verified) Outpatient Encounter Medications as of 10/22/2024  Medication Sig   albuterol  (VENTOLIN  HFA) 108 (90 Base) MCG/ACT inhaler Inhale into the lungs every 6 (six) hours as needed for wheezing or shortness of breath.   Atogepant (QULIPTA) 30 MG TABS Take 1 tablet by mouth daily as needed.   Calcium -Magnesium -Vitamin D  300-150-400 MG-MG-UNIT TABS Take by mouth.   cholecalciferol (VITAMIN D3) 25 MCG (1000 UT) tablet Take 1,000 Units by mouth daily.   DULoxetine  (CYMBALTA ) 60 MG capsule Take 1 capsule (60 mg total) by mouth every morning.   ezetimibe  (ZETIA ) 10 MG tablet Take 1 tablet (10 mg total) by mouth daily.   fluconazole  (DIFLUCAN ) 150 MG tablet Take 1 tablet (150 mg total) by mouth every other day.   fluticasone -salmeterol (WIXELA INHUB) 100-50 MCG/ACT AEPB INHALE 1 PUFF INTO THE LUNGS TWICE A DAY   linaclotide  (LINZESS ) 145 MCG CAPS capsule Take 1 capsule (145 mcg total) by mouth daily before breakfast.   lipase/protease/amylase (CREON ) 36000 UNITS CPEP capsule Take 1 tablet with the first bite of each and meal and 1 capsule with the first bite of each snack.   meclizine (ANTIVERT) 25 MG tablet Take 25 mg by mouth 3 (three) times daily as needed for dizziness.   meloxicam  (MOBIC ) 15 MG tablet Take 1 tablet (15 mg total) by mouth daily.   omeprazole  (PRILOSEC) 20 MG capsule Take 1 capsule (20 mg total) by mouth daily before breakfast.   QUEtiapine  (SEROQUEL ) 25 MG tablet Take 1 tablet (25 mg total) by mouth at bedtime.  QULIPTA 60 MG TABS Take 1 tablet by mouth daily.   rOPINIRole  (REQUIP ) 0.5 MG tablet Take 1 tablet (0.5 mg total) by mouth at bedtime.   rosuvastatin  (CRESTOR ) 5 MG tablet Take 1 tablet (5 mg total) by mouth daily.   SUMAtriptan (IMITREX) 100 MG tablet Take as directed PRN migraines   tiZANidine  (ZANAFLEX ) 2 MG tablet TAKE 1 TABLET (2 MG TOTAL) BY MOUTH 3 (THREE) TIMES DAILY.   vitamin B-12 (CYANOCOBALAMIN ) 1000 MCG tablet Take 1,000 mcg by mouth daily.   No  facility-administered encounter medications on file as of 10/22/2024.    Allergies (verified) Diclofenac-misoprostol, Fenofibric acid, Lipitor [atorvastatin], and Nsaids   History: Past Medical History:  Diagnosis Date   B12 deficiency 12/27/2017   Diverticulitis    Fibromyalgia    GERD (gastroesophageal reflux disease)    High cholesterol    Migraine    MS (multiple sclerosis)    PUD (peptic ulcer disease) 04/28/2018   Past Surgical History:  Procedure Laterality Date   BACK SURGERY  1991?   Pt usure of date   BREAST BIOPSY Right    COLONOSCOPY  09/2007   COLONOSCOPY WITH PROPOFOL  N/A 08/28/2017   Procedure: COLONOSCOPY WITH PROPOFOL ;  Surgeon: Dellie Louanne MATSU, MD;  Location: [facility] ENDOSCOPY;  Service: Endoscopy;  Laterality: N/A;   COLONOSCOPY WITH PROPOFOL  N/A 04/02/2023   Procedure: COLONOSCOPY WITH PROPOFOL ;  Surgeon: Unk Corinn Skiff, MD;  Location[facility];  Service: Endoscopy;  Laterality: N/A;   ESOPHAGOGASTRODUODENOSCOPY N/A 05/14/2024   Procedure: EGD (ESOPHAGOGASTRODUODENOSCOPY);  Surgeon: Unk Corinn Skiff, MD;  Location: [facility] ENDOSCOPY;  Service: Gastroenterology;  Laterality: N/A;   ESOPHAGOGASTRODUODENOSCOPY (EGD) WITH PROPOFOL  N/A 11/24/2018   Procedure: ESOPHAGOGASTRODUODENOSCOPY (EGD) WITH PROPOFOL ;  Surgeon: Unk Corinn Skiff, MD;  Location: [facility] ENDOSCOPY;  Service: Gastroenterology;  Laterality: N/A;   Foot sugery Right    3rd toe from the right   Family History  Problem Relation Age of Onset   Heart disease Mother    Diabetes Mother    Kidney disease Mother    Hypertension Mother    Heart disease Father    Hyperlipidemia Father    Hypertension Father    Heart disease Other    Cancer Other        Breast   Diabetes Other    Hyperlipidemia Sister    Hypertension Sister    Kidney disease Sister    Diabetes Sister    Lung cancer Paternal Aunt    Cancer Maternal Grandmother        breast   Social History   Socioeconomic  History   Marital status: Single    Spouse name: Not on file   Number of children: 2   Years of education: Not on file   Highest education level: Associate degree: academic program  Occupational History    Employer: DISABLED  Tobacco Use   Smoking status: Never   Smokeless tobacco: Never   Tobacco comments:    smoking cessation materials not required  Vaping Use   Vaping status: Never Used  Substance and Sexual Activity   Alcohol use: No    Alcohol/week: 0.0 standard drinks of alcohol   Drug use: No   Sexual activity: Yes    Birth control/protection: Post-menopausal  Other Topics Concern   Not on file  Social History Narrative   Lives at home by herself.   Disable from chronic back pain since 1993   Diagnosed with MS in the 2000's   Social Drivers of  Health   Financial Resource Strain: Low Risk  (10/22/2024)   Overall Financial Resource Strain (CARDIA)    Difficulty of Paying Living Expenses: Not hard at all  Food Insecurity: No Food Insecurity (10/22/2024)   Hunger Vital Sign    Worried About Running Out of Food in the Last Year: Never true    Ran Out of Food in the Last Year: Never true  Transportation Needs: No Transportation Needs (10/22/2024)   PRAPARE - Administrator, Civil Service (Medical): No    Lack of Transportation (Non-Medical): No  Physical Activity: Insufficiently Active (10/22/2024)   Exercise Vital Sign    Days of Exercise per Week: 2 days    Minutes of Exercise per Session: 60 min  Stress: No Stress Concern Present (10/22/2024)   [employer] of Occupational Health - Occupational Stress Questionnaire    Feeling of Stress: Not at all  Social Connections: Moderately Integrated (10/22/2024)   Social Connection and Isolation Panel    Frequency of Communication with Friends and Family: More than three times a week    Frequency of Social Gatherings with Friends and Family: Twice a week    Attends Religious Services: More than 4 times  per year    Active Member of [employer] or Organizations: Yes    Attends Engineer, Structural: More than 4 times per year    Marital Status: Never married    Tobacco Counseling Counseling given: Not Answered Tobacco comments: smoking cessation materials not required    Clinical Intake:  Pre-visit preparation completed: Yes  Pain : 0-10 Pain Score: 3  Pain Type: Chronic pain Pain Location: Back Pain Orientation: Left Pain Descriptors / Indicators: Aching, Constant Pain Onset: More than a month ago Pain Frequency: Constant     BMI - recorded: 26 Nutritional Status: BMI 25 -29 Overweight Nutritional Risks: None Diabetes: No  Lab Results  Component Value Date   HGBA1C 6.4 (A) 08/05/2024   HGBA1C 6.4 (A) 08/30/2023   HGBA1C 6.2 (A) 02/28/2023     How often do you need to have someone help you when you read instructions, pamphlets, or other written materials from your doctor or pharmacy?: 1 - Never  Interpreter Needed?: No  Information entered by :: JHONNIE DAS, LPN   Activities of Daily Living    10/22/2024    8:35 AM  In your present state of health, do you have any difficulty performing the following activities:  Hearing? 0  Vision? 0  Difficulty concentrating or making decisions? 0  Walking or climbing stairs? 0  Dressing or bathing? 0  Doing errands, shopping? 0  Preparing Food and eating ? N  Using the Toilet? N  In the past six months, have you accidently leaked urine? N  Do you have problems with loss of bowel control? N  Managing your Medications? N  Managing your Finances? N  Housekeeping or managing your Housekeeping? N    Patient Care Team: Sowles, [name], MD as PCP - General (Family Medicine) Maree Jannett POUR, MD as Consulting Physician (Neurology) Florencio Cara BIRCH, MD as Consulting Physician (Cardiology) Barbarann Oneil BROCKS, MD (Inactive) as Consulting Physician (Orthopedic Surgery) Verta Royden DASEN, DPM as Consulting Physician  (Podiatry) Pa, [facility] (Optometry)  I have updated your Care Teams any recent Medical Services you may have received from other providers in the past year.     Assessment:   This is a routine wellness examination for Arispe.  Hearing/Vision screen Hearing Screening - Comments::  NO AIDS Vision Screening - Comments:: WEARS GLASSES- [facility] EX[facility] LAST WEEK- SEEN EVERY YEAR   Goals Addressed             This Visit's Progress    DIET - EAT MORE FRUITS AND VEGETABLES         Depression Screen     10/22/2024    8:32 AM 08/17/2024    2:31 PM 08/05/2024   11:20 AM 10/11/2023   10:37 AM 09/19/2023    8:39 AM 08/30/2023    8:21 AM 02/28/2023    7:49 AM  PHQ 2/9 Scores  PHQ - 2 Score 0 0 0 0 0 0 0  PHQ- 9 Score 0   8  2 3     Fall Risk     10/22/2024    8:35 AM 08/17/2024    2:31 PM 08/05/2024   11:20 AM 10/11/2023   10:37 AM 09/13/2023    1:02 PM  Fall Risk   Falls in the past year? 1 1 1  0 0  Number falls in past yr: 0 0 0    Injury with Fall? 0 0 0    Risk for fall due to :  Impaired balance/gait Impaired balance/gait No Fall Risks No Fall Risks  Follow up Falls evaluation completed;Falls prevention discussed Falls evaluation completed Falls evaluation completed Falls prevention discussed Education provided;Falls prevention discussed    MEDICARE RISK AT HOME:  Medicare Risk at Home Any stairs in or around the home?: Yes If so, are there any without handrails?: Yes Home free of loose throw rugs in walkways, pet beds, electrical cords, etc?: Yes Adequate lighting in your home to reduce risk of falls?: Yes Life alert?: No Use of a cane, walker or w/c?: No Grab bars in the bathroom?: No Shower chair or bench in shower?: No Elevated toilet seat or a handicapped toilet?: No  TIMED UP AND GO:  Was the test performed?  No  Cognitive Function: 6CIT completed        10/22/2024    8:37 AM 09/19/2023    8:21 AM 08/21/2022    8:12 AM 08/15/2021    2:31 PM  08/11/2020    2:36 PM  6CIT Screen  What Year? 0 points 0 points 0 points 0 points 0 points  What month? 0 points 0 points 0 points 0 points 0 points  What time? 0 points 0 points 0 points 0 points 0 points  Count back from 20 0 points 0 points 0 points 0 points 0 points  Months in reverse 0 points 0 points 0 points 0 points 0 points  Repeat phrase 0 points 0 points 0 points 0 points 2 points  Total Score 0 points 0 points 0 points 0 points 2 points    Immunizations Immunization History  Administered Date(s) Administered   Fluad Quad(high Dose 65+) 02/28/2023   INFLUENZA, HIGH DOSE SEASONAL PF 01/30/2022, 08/31/2023, 09/21/2024   Influenza, Quadrivalent, Recombinant, Inj, Pf 01/11/2020   Influenza,inj,Quad PF,6+ Mos 09/20/2020, 09/21/2021   MMR 03/17/2003   Moderna Covid-19 Fall Seasonal Vaccine 62yrs & older 10/15/2022, 09/16/2023, 08/07/2024   Moderna Covid-19 Vaccine Bivalent Booster 30yrs & up 10/09/2021, 10/15/2022   Moderna Sars-Covid-2 Vaccination 01/29/2020, 02/26/2020, 10/24/2020, 07/27/2021   PNEUMOCOCCAL CONJUGATE-20 08/29/2022   Td 03/17/2003, 12/25/2015   Tdap 08/05/2015   Zoster Recombinant(Shingrix) 01/12/2020, 07/02/2020    Screening Tests Health Maintenance  Topic Date Due   FOOT EXAM  08/29/2024   COVID-19 Vaccine (9 - 2025-26 season) 10/02/2024  HEMOGLOBIN A1C  02/05/2025   Mammogram  05/22/2025   Diabetic kidney evaluation - eGFR measurement  08/05/2025   Diabetic kidney evaluation - Urine ACR  08/05/2025   OPHTHALMOLOGY EXAM  10/12/2025   Medicare Annual Wellness (AWV)  10/22/2025   DTaP/Tdap/Td (4 - Td or Tdap) 12/24/2025   DEXA SCAN  09/18/2027   Colonoscopy  04/01/2033   Pneumococcal Vaccine: 50+ Years  Completed   Influenza Vaccine  Completed   Hepatitis C Screening  Completed   Zoster Vaccines- Shingrix  Completed   Meningococcal B Vaccine  Aged Out    Health Maintenance Items Addressed: UTD ON SHOTS; UTD ON COLONOSCOPY, MAMMOGRAM &  BDS  Additional Screening:  Vision Screening: Recommended annual ophthalmology exams for early detection of glaucoma and other disorders of the eye. Is the patient up to date with their annual eye exam?  Yes  Who is the provider or what is the name of the office in which the patient attends annual eye exams? [facility]  Dental Screening: Recommended annual dental exams for proper oral hygiene  Community Resource Referral / Chronic Care Management: CRR required this visit?  No   CCM required this visit?  No   Plan:    I have personally reviewed and noted the following in the patient's chart:   Medical and social history Use of alcohol, tobacco or illicit drugs  Current medications and supplements including opioid prescriptions. Patient is not currently taking opioid prescriptions. Functional ability and status Nutritional status Physical activity Advanced directives List of other physicians Hospitalizations, surgeries, and ER visits in previous 12 months Vitals Screenings to include cognitive, depression, and falls Referrals and appointments  In addition, I have reviewed and discussed with patient certain preventive protocols, quality metrics, and best practice recommendations. A written personalized care plan for preventive services as well as general preventive health recommendations were provided to patient.   Jhonnie GORMAN Das, LPN   [id number]   After Visit Summary: (MyChart) Due to this being a telephonic visit, the after visit summary with patients personalized plan was offered to patient via MyChart   Notes: Nothing significant to report at this time.

## 2024-11-07 ENCOUNTER — Other Ambulatory Visit: Payer: Self-pay | Admitting: Family Medicine

## 2024-11-07 DIAGNOSIS — G2581 Restless legs syndrome: Secondary | ICD-10-CM

## 2024-11-10 NOTE — Telephone Encounter (Signed)
 Requested Prescriptions  Pending Prescriptions Disp Refills   rOPINIRole  (REQUIP ) 0.5 MG tablet [Pharmacy Med Name: ROPINIROLE  HCL 0.5 MG TABLET] 90 tablet 0    Sig: TAKE 1 TABLET BY MOUTH AT BEDTIME.     Neurology:  Parkinsonian Agents Failed - 11/10/2024 12:28 PM      Failed - Last BP in normal range    BP Readings from Last 1 Encounters:  08/17/24 (!) 146/62         Passed - Last Heart Rate in normal range    Pulse Readings from Last 1 Encounters:  08/17/24 68         Passed - Valid encounter within last 12 months    Recent Outpatient Visits           2 months ago Angina pectoris   [facility] Ashlee Richardson Mire, Ashlee Richardson   3 months ago Type 2 diabetes mellitus with pressure callus [facility])   [facility] Ashlee Richardson Mire, Ashlee Richardson       Future Appointments             In 2 months Ashlee Richardson, [name], Ashlee Richardson [facility] [place]

## 2025-02-05 ENCOUNTER — Ambulatory Visit: Admitting: Family Medicine

## 2025-10-28 ENCOUNTER — Ambulatory Visit
